# Patient Record
Sex: Female | Born: 1935 | Race: Black or African American | Hispanic: No | Smoking: Never smoker
Health system: Southern US, Community
[De-identification: ages and names within clinical notes are randomized; demographics above are authoritative.]

## PROBLEM LIST (undated history)

## (undated) DIAGNOSIS — IMO0001 Reserved for inherently not codable concepts without codable children: Secondary | ICD-10-CM

## (undated) DIAGNOSIS — I10 Essential (primary) hypertension: Secondary | ICD-10-CM

## (undated) DIAGNOSIS — K449 Diaphragmatic hernia without obstruction or gangrene: Secondary | ICD-10-CM

## (undated) DIAGNOSIS — F419 Anxiety disorder, unspecified: Secondary | ICD-10-CM

## (undated) DIAGNOSIS — M1990 Unspecified osteoarthritis, unspecified site: Secondary | ICD-10-CM

## (undated) DIAGNOSIS — K219 Gastro-esophageal reflux disease without esophagitis: Secondary | ICD-10-CM

## (undated) HISTORY — PX: ABDOMINAL HYSTERECTOMY: SHX81

## (undated) HISTORY — PX: HEMORROIDECTOMY: SUR656

## (undated) HISTORY — PX: ABDOMINAL SURGERY: SHX537

## (undated) HISTORY — PX: APPENDECTOMY: SHX54

## (undated) HISTORY — PX: TONSILLECTOMY: SUR1361

---

## 2006-06-28 ENCOUNTER — Ambulatory Visit: Payer: Self-pay | Admitting: Cardiology

## 2009-12-16 ENCOUNTER — Ambulatory Visit: Payer: Self-pay | Admitting: Cardiology

## 2010-01-03 ENCOUNTER — Ambulatory Visit: Payer: Self-pay | Admitting: Internal Medicine

## 2011-10-28 DIAGNOSIS — R112 Nausea with vomiting, unspecified: Secondary | ICD-10-CM

## 2011-12-21 ENCOUNTER — Other Ambulatory Visit: Payer: Self-pay | Admitting: Medical

## 2014-11-20 ENCOUNTER — Emergency Department (HOSPITAL_COMMUNITY): Payer: Medicare Other

## 2014-11-20 ENCOUNTER — Emergency Department (HOSPITAL_COMMUNITY)
Admission: EM | Admit: 2014-11-20 | Discharge: 2014-11-20 | Disposition: A | Discharge status: Home / Self Care | Payer: Medicare Other | Attending: Emergency Medicine | Admitting: Emergency Medicine

## 2014-11-20 ENCOUNTER — Encounter (HOSPITAL_COMMUNITY): Payer: Self-pay | Admitting: *Deleted

## 2014-11-20 DIAGNOSIS — K529 Noninfective gastroenteritis and colitis, unspecified: Secondary | ICD-10-CM | POA: Diagnosis not present

## 2014-11-20 DIAGNOSIS — Z79899 Other long term (current) drug therapy: Secondary | ICD-10-CM | POA: Insufficient documentation

## 2014-11-20 DIAGNOSIS — E860 Dehydration: Secondary | ICD-10-CM | POA: Diagnosis not present

## 2014-11-20 DIAGNOSIS — M1990 Unspecified osteoarthritis, unspecified site: Secondary | ICD-10-CM | POA: Insufficient documentation

## 2014-11-20 DIAGNOSIS — R1110 Vomiting, unspecified: Secondary | ICD-10-CM | POA: Diagnosis present

## 2014-11-20 DIAGNOSIS — I10 Essential (primary) hypertension: Secondary | ICD-10-CM | POA: Diagnosis not present

## 2014-11-20 DIAGNOSIS — Z7982 Long term (current) use of aspirin: Secondary | ICD-10-CM | POA: Diagnosis not present

## 2014-11-20 DIAGNOSIS — F419 Anxiety disorder, unspecified: Secondary | ICD-10-CM | POA: Diagnosis not present

## 2014-11-20 DIAGNOSIS — K219 Gastro-esophageal reflux disease without esophagitis: Secondary | ICD-10-CM | POA: Diagnosis not present

## 2014-11-20 DIAGNOSIS — Z880 Allergy status to penicillin: Secondary | ICD-10-CM | POA: Diagnosis not present

## 2014-11-20 HISTORY — DX: Reserved for inherently not codable concepts without codable children: IMO0001

## 2014-11-20 HISTORY — DX: Diaphragmatic hernia without obstruction or gangrene: K44.9

## 2014-11-20 HISTORY — DX: Unspecified osteoarthritis, unspecified site: M19.90

## 2014-11-20 HISTORY — DX: Essential (primary) hypertension: I10

## 2014-11-20 HISTORY — DX: Anxiety disorder, unspecified: F41.9

## 2014-11-20 HISTORY — DX: Gastro-esophageal reflux disease without esophagitis: K21.9

## 2014-11-20 LAB — COMPREHENSIVE METABOLIC PANEL
ALT: 22 U/L (ref 14–54)
AST: 31 U/L (ref 15–41)
Albumin: 3.9 g/dL (ref 3.5–5.0)
Alkaline Phosphatase: 64 U/L (ref 38–126)
Anion gap: 10 (ref 5–15)
BUN: 18 mg/dL (ref 6–20)
CO2: 26 mmol/L (ref 22–32)
Calcium: 9.5 mg/dL (ref 8.9–10.3)
Chloride: 103 mmol/L (ref 101–111)
Creatinine, Ser: 1.13 mg/dL — ABNORMAL HIGH (ref 0.44–1.00)
GFR calc Af Amer: 47 mL/min — ABNORMAL LOW (ref >60)
GFR, EST NON AFRICAN AMERICAN: 41 mL/min — AB (ref >60)
Glucose, Bld: 179 mg/dL — ABNORMAL HIGH (ref 65–99)
POTASSIUM: 4.3 mmol/L (ref 3.5–5.1)
Sodium: 139 mmol/L (ref 135–145)
Total Bilirubin: 1.2 mg/dL (ref 0.3–1.2)
Total Protein: 7.7 g/dL (ref 6.5–8.1)

## 2014-11-20 LAB — CBC WITH DIFFERENTIAL/PLATELET
BASOS ABS: 0 10*3/uL (ref 0.0–0.1)
BASOS PCT: 0 %
Eosinophils Absolute: 0 10*3/uL (ref 0.0–0.7)
Eosinophils Relative: 0 %
HEMATOCRIT: 41.9 % (ref 36.0–46.0)
HEMOGLOBIN: 14 g/dL (ref 12.0–15.0)
Lymphocytes Relative: 5 %
Lymphs Abs: 0.6 10*3/uL — ABNORMAL LOW (ref 0.7–4.0)
MCH: 30.2 pg (ref 26.0–34.0)
MCHC: 33.4 g/dL (ref 30.0–36.0)
MCV: 90.5 fL (ref 78.0–100.0)
Monocytes Absolute: 0.7 10*3/uL (ref 0.1–1.0)
Monocytes Relative: 7 %
NEUTROS ABS: 9.2 10*3/uL — AB (ref 1.7–7.7)
NEUTROS PCT: 88 %
Platelets: 182 10*3/uL (ref 150–400)
RBC: 4.63 MIL/uL (ref 3.87–5.11)
RDW: 13.8 % (ref 11.5–15.5)
WBC: 10.5 10*3/uL (ref 4.0–10.5)

## 2014-11-20 LAB — LIPASE, BLOOD: LIPASE: 31 U/L (ref 22–51)

## 2014-11-20 MED ORDER — ONDANSETRON HCL 4 MG PO TABS: 4.0000 mg | ORAL_TABLET | Freq: Four times a day (QID) | ORAL | Status: DC

## 2014-11-20 MED ORDER — MORPHINE SULFATE (PF) 2 MG/ML IV SOLN
2.0000 mg | Freq: Once | INTRAVENOUS | Status: AC
  Administered 2014-11-20: 2 mg via INTRAVENOUS
  Filled 2014-11-20: qty 1

## 2014-11-20 MED ORDER — ONDANSETRON HCL 4 MG/2ML IJ SOLN
4.0000 mg | Freq: Once | INTRAMUSCULAR | Status: AC
  Administered 2014-11-20: 4 mg via INTRAVENOUS
  Filled 2014-11-20: qty 2

## 2014-11-20 MED ORDER — SODIUM CHLORIDE 0.9 % IV BOLUS (SEPSIS)
500.0000 mL | Freq: Once | INTRAVENOUS | Status: AC
  Administered 2014-11-20: 500 mL via INTRAVENOUS

## 2014-11-20 NOTE — ED Notes (Signed)
Pt states vomiting began at ~2200 last night. Pt states that she vomiting ~8x during the night. Last vomited at 0600. Pt also states discomfort to epigastric area.

## 2014-11-20 NOTE — ED Provider Notes (Signed)
CSN: [id number]     Arrival date & time 11/20/14  [id number] History  By signing my name below, I, Marica OtterNusrat Rahman, attest that this documentation has been prepared under the direction and in the presence of Donnetta HutchingBrian Trinidad Petron, MD. Electronically Signed: Marica OtterNusrat Rahman, ED Scribe. 11/20/2014. 9:59 AM.  Chief Complaint  Patient presents with  . Emesis   The history is provided by the patient. No language interpreter was used.   PCP: Ernestine ConradBLUTH, KIRK, MD HPI Comments: Dawn Estrada is a 81 y.o. female who presents to the Emergency Department complaining of vomiting of brown bilious matter and undigested food onset at 11PM last night. Pt reports eight episodes of vomiting last night and one episode of vomiting at 6AM this morning. Associated Sx include: 5/10 epigastrica bd pain. Pt denies diarrhea. Pt denies taking any measures at home to alleviate her Sx.  Past Medical History  Diagnosis Date  . Hypertension   . Reflux   . Hiatal hernia   . Anxiety   . Arthritis    Past Surgical History  Procedure Laterality Date  . Abdominal hysterectomy    . Appendectomy    . Hemorroidectomy    . Abdominal surgery      resection  . Tonsillectomy     No family history on file. Social History  Substance Use Topics  . Smoking status: Never Smoker   . Smokeless tobacco: None  . Alcohol Use: No   OB History    No data available     Review of Systems  Constitutional: Negative for fever.    A complete 10 system review of systems was obtained and all systems are negative except as noted in the HPI and PMH.   Allergies  Penicillins  Home Medications   Prior to Admission medications   Medication Sig Start Date End Date Taking? Authorizing Provider  ALPRAZolam (XANAX) 0.25 MG tablet Take 0.125 mg by mouth daily.   Yes Historical Provider, MD  aspirin EC 81 MG tablet Take 81 mg by mouth daily as needed (heart).   Yes Historical Provider, MD  brimonidine (ALPHAGAN) 0.15 % ophthalmic solution Place 1 drop  into both eyes 2 (two) times daily. 10/19/14  Yes Historical Provider, MD  cholecalciferol (VITAMIN D) 1000 UNITS tablet Take 1,000 Units by mouth daily.   Yes Historical Provider, MD  hydrochlorothiazide (MICROZIDE) 12.5 MG capsule Take 12.5 mg by mouth daily.   Yes Historical Provider, MD  metoprolol succinate (TOPROL-XL) 25 MG 24 hr tablet Take 12.5 mg by mouth daily.   Yes Historical Provider, MD  Multiple Vitamin (MULTIVITAMIN WITH MINERALS) TABS tablet Take 1 tablet by mouth daily.   Yes Historical Provider, MD  Omega-3 Fatty Acids (FISH OIL PO) Take 1 capsule by mouth daily.   Yes Historical Provider, MD  PAZEO 0.7 % SOLN Place 1 drop into both eyes daily. 10/06/14  Yes Historical Provider, MD  potassium chloride SA (K-DUR,KLOR-CON) 20 MEQ tablet Take 1 tablet by mouth daily. 11/19/14  Yes Historical Provider, MD  ranitidine (ZANTAC) 150 MG tablet Take 150 mg by mouth 2 (two) times daily.   Yes Historical Provider, MD  ondansetron (ZOFRAN) 4 MG tablet Take 1 tablet (4 mg total) by mouth every 6 (six) hours. 11/20/14   Donnetta HutchingBrian Nollan Muldrow, MD   Triage Vitals: BP 156/71 mmHg  Pulse 87  Temp(Src) 98.4 F (36.9 C) (Oral)  Resp 20  Ht 5\' 6"  (1.676 m)  Wt 135 lb (61.236 kg)  BMI 21.80 kg/m2  SpO2  98% Physical Exam  Constitutional: She is oriented to person, place, and time. She appears well-developed and well-nourished.  Slightly dehydrated appearing but non-toxic appearing.   HENT:  Head: Normocephalic and atraumatic.  Mouth/Throat: Mucous membranes are dry.  Eyes: Conjunctivae and EOM are normal. Pupils are equal, round, and reactive to light.  Neck: Normal range of motion. Neck supple.  Cardiovascular: Normal rate and regular rhythm.   Pulmonary/Chest: Effort normal and breath sounds normal.  Abdominal: Soft. Bowel sounds are normal. There is tenderness in the epigastric area.  Musculoskeletal: Normal range of motion.  Neurological: She is alert and oriented to person, place, and time.   Skin: Skin is warm and dry.  Psychiatric: She has a normal mood and affect. Her behavior is normal.  Nursing note and vitals reviewed.   ED Course  Procedures (including critical care time) DIAGNOSTIC STUDIES: Oxygen Saturation is 98% on RA, nl by my interpretation.    COORDINATION OF CARE: 9:41 AM: Discussed treatment plan with pt and her son at bedside; patient verbalizes understanding and agrees with treatment plan.  MDM   Final diagnoses:  Gastroenteritis    Patient feels much better after IV fluids and IV Zofran. No acute abdomen. Glucose minimally elevated. Acute abdominal series shows a hiatal hernia but no obstruction. Discharge medications Zofran 4 mg  I, Mehek Grega, personally performed the services described in this documentation. All medical record entries made by the scribe were at my direction and in my presence.  I have reviewed the chart and discharge instructions and agree that the record reflects my personal performance and is accurate and complete. Jayona Mccaig.  11/20/2014. 1:55 PM.     Donnetta Hutching, MD 11/20/14 1356

## 2014-11-20 NOTE — Discharge Instructions (Signed)
Clear liquids today. Prescription for nausea medicine. Return if worse.

## 2014-11-25 ENCOUNTER — Emergency Department (HOSPITAL_COMMUNITY): Payer: Medicare Other

## 2014-11-25 ENCOUNTER — Encounter (HOSPITAL_COMMUNITY): Payer: Self-pay | Admitting: Cardiology

## 2014-11-25 ENCOUNTER — Observation Stay (HOSPITAL_COMMUNITY)
Admission: EM | Admit: 2014-11-25 | Discharge: 2014-11-26 | Disposition: A | Discharge status: Home / Self Care | Payer: Medicare Other | Attending: Internal Medicine | Admitting: Internal Medicine

## 2014-11-25 DIAGNOSIS — M1990 Unspecified osteoarthritis, unspecified site: Secondary | ICD-10-CM | POA: Diagnosis not present

## 2014-11-25 DIAGNOSIS — K449 Diaphragmatic hernia without obstruction or gangrene: Secondary | ICD-10-CM | POA: Insufficient documentation

## 2014-11-25 DIAGNOSIS — R079 Chest pain, unspecified: Secondary | ICD-10-CM

## 2014-11-25 DIAGNOSIS — Z7982 Long term (current) use of aspirin: Secondary | ICD-10-CM | POA: Diagnosis not present

## 2014-11-25 DIAGNOSIS — R531 Weakness: Secondary | ICD-10-CM

## 2014-11-25 DIAGNOSIS — E876 Hypokalemia: Secondary | ICD-10-CM

## 2014-11-25 DIAGNOSIS — M25461 Effusion, right knee: Secondary | ICD-10-CM

## 2014-11-25 DIAGNOSIS — K219 Gastro-esophageal reflux disease without esophagitis: Secondary | ICD-10-CM | POA: Insufficient documentation

## 2014-11-25 DIAGNOSIS — R1013 Epigastric pain: Secondary | ICD-10-CM | POA: Diagnosis not present

## 2014-11-25 DIAGNOSIS — F419 Anxiety disorder, unspecified: Secondary | ICD-10-CM | POA: Diagnosis not present

## 2014-11-25 DIAGNOSIS — Z79899 Other long term (current) drug therapy: Secondary | ICD-10-CM | POA: Diagnosis not present

## 2014-11-25 DIAGNOSIS — R000 Tachycardia, unspecified: Secondary | ICD-10-CM

## 2014-11-25 DIAGNOSIS — J159 Unspecified bacterial pneumonia: Secondary | ICD-10-CM | POA: Diagnosis not present

## 2014-11-25 DIAGNOSIS — I10 Essential (primary) hypertension: Secondary | ICD-10-CM | POA: Diagnosis not present

## 2014-11-25 DIAGNOSIS — R7989 Other specified abnormal findings of blood chemistry: Secondary | ICD-10-CM | POA: Diagnosis not present

## 2014-11-25 DIAGNOSIS — K529 Noninfective gastroenteritis and colitis, unspecified: Secondary | ICD-10-CM

## 2014-11-25 DIAGNOSIS — J189 Pneumonia, unspecified organism: Secondary | ICD-10-CM | POA: Diagnosis present

## 2014-11-25 DIAGNOSIS — D72829 Elevated white blood cell count, unspecified: Secondary | ICD-10-CM

## 2014-11-25 DIAGNOSIS — R778 Other specified abnormalities of plasma proteins: Secondary | ICD-10-CM

## 2014-11-25 DIAGNOSIS — Z880 Allergy status to penicillin: Secondary | ICD-10-CM | POA: Insufficient documentation

## 2014-11-25 LAB — MAGNESIUM: Magnesium: 1.6 mg/dL — ABNORMAL LOW (ref 1.7–2.4)

## 2014-11-25 LAB — I-STAT CG4 LACTIC ACID, ED
LACTIC ACID, VENOUS: 1.61 mmol/L (ref 0.5–2.0)
Lactic Acid, Venous: 1.09 mmol/L (ref 0.5–2.0)

## 2014-11-25 LAB — URINALYSIS, ROUTINE W REFLEX MICROSCOPIC
BILIRUBIN URINE: NEGATIVE
Glucose, UA: NEGATIVE mg/dL
NITRITE: NEGATIVE
PH: 6.5 (ref 5.0–8.0)
Protein, ur: NEGATIVE mg/dL
UROBILINOGEN UA: 0.2 mg/dL (ref 0.0–1.0)

## 2014-11-25 LAB — TROPONIN I
TROPONIN I: 0.04 ng/mL — AB (ref <0.031)
TROPONIN I: 0.04 ng/mL — AB (ref <0.031)
TROPONIN I: 0.05 ng/mL — AB (ref <0.031)
TROPONIN I: 0.06 ng/mL — AB (ref <0.031)

## 2014-11-25 LAB — CBC
HEMATOCRIT: 36.2 % (ref 36.0–46.0)
HEMOGLOBIN: 12.1 g/dL (ref 12.0–15.0)
MCH: 29.7 pg (ref 26.0–34.0)
MCHC: 33.4 g/dL (ref 30.0–36.0)
MCV: 88.9 fL (ref 78.0–100.0)
PLATELETS: 181 10*3/uL (ref 150–400)
RBC: 4.07 MIL/uL (ref 3.87–5.11)
RDW: 13.4 % (ref 11.5–15.5)
WBC: 11 10*3/uL — AB (ref 4.0–10.5)

## 2014-11-25 LAB — HEPATIC FUNCTION PANEL
ALK PHOS: 51 U/L (ref 38–126)
ALT: 16 U/L (ref 14–54)
AST: 24 U/L (ref 15–41)
Albumin: 3.4 g/dL — ABNORMAL LOW (ref 3.5–5.0)
BILIRUBIN DIRECT: 0.2 mg/dL (ref 0.1–0.5)
BILIRUBIN INDIRECT: 1.4 mg/dL — AB (ref 0.3–0.9)
TOTAL PROTEIN: 7.4 g/dL (ref 6.5–8.1)
Total Bilirubin: 1.6 mg/dL — ABNORMAL HIGH (ref 0.3–1.2)

## 2014-11-25 LAB — URINE MICROSCOPIC-ADD ON

## 2014-11-25 LAB — BASIC METABOLIC PANEL
ANION GAP: 12 (ref 5–15)
BUN: 11 mg/dL (ref 6–20)
CO2: 26 mmol/L (ref 22–32)
Calcium: 9 mg/dL (ref 8.9–10.3)
Chloride: 101 mmol/L (ref 101–111)
Creatinine, Ser: 0.91 mg/dL (ref 0.44–1.00)
GFR calc Af Amer: >60 mL/min (ref >60)
GFR, EST NON AFRICAN AMERICAN: 53 mL/min — AB (ref >60)
Glucose, Bld: 143 mg/dL — ABNORMAL HIGH (ref 65–99)
POTASSIUM: 2.9 mmol/L — AB (ref 3.5–5.1)
SODIUM: 139 mmol/L (ref 135–145)

## 2014-11-25 LAB — TSH: TSH: 1.521 u[IU]/mL (ref 0.350–4.500)

## 2014-11-25 LAB — VITAMIN B12: Vitamin B-12: 234 pg/mL (ref 180–914)

## 2014-11-25 LAB — LIPASE, BLOOD: LIPASE: 29 U/L (ref 22–51)

## 2014-11-25 MED ORDER — SODIUM CHLORIDE 0.9 % IV SOLN: INTRAVENOUS | Status: DC

## 2014-11-25 MED ORDER — FENTANYL CITRATE (PF) 100 MCG/2ML IJ SOLN
12.5000 ug | Freq: Once | INTRAMUSCULAR | Status: AC
  Administered 2014-11-25: 12.5 ug via INTRAVENOUS
  Filled 2014-11-25: qty 2

## 2014-11-25 MED ORDER — ACETAMINOPHEN 650 MG RE SUPP: 650.0000 mg | Freq: Four times a day (QID) | RECTAL | Status: DC | PRN

## 2014-11-25 MED ORDER — IOHEXOL 300 MG/ML  SOLN
50.0000 mL | Freq: Once | INTRAMUSCULAR | Status: AC | PRN
  Administered 2014-11-25: 50 mL via ORAL

## 2014-11-25 MED ORDER — ASPIRIN EC 81 MG PO TBEC
81.0000 mg | DELAYED_RELEASE_TABLET | Freq: Every day | ORAL | Status: DC
  Administered 2014-11-25 – 2014-11-26 (×2): 81 mg via ORAL
  Filled 2014-11-25 (×2): qty 1

## 2014-11-25 MED ORDER — FAMOTIDINE 20 MG PO TABS
20.0000 mg | ORAL_TABLET | Freq: Every day | ORAL | Status: DC
  Administered 2014-11-26: 20 mg via ORAL
  Filled 2014-11-25: qty 1

## 2014-11-25 MED ORDER — OLOPATADINE HCL 0.7 % OP SOLN: 1.0000 [drp] | Freq: Every day | OPHTHALMIC | Status: DC

## 2014-11-25 MED ORDER — ALPRAZOLAM 0.25 MG PO TABS
0.1250 mg | ORAL_TABLET | Freq: Every day | ORAL | Status: DC
  Administered 2014-11-25 – 2014-11-26 (×2): 0.125 mg via ORAL
  Filled 2014-11-25 (×2): qty 1

## 2014-11-25 MED ORDER — BRIMONIDINE TARTRATE 0.15 % OP SOLN
1.0000 [drp] | Freq: Two times a day (BID) | OPHTHALMIC | Status: DC
  Administered 2014-11-25 – 2014-11-26 (×2): 1 [drp] via OPHTHALMIC
  Filled 2014-11-25: qty 5

## 2014-11-25 MED ORDER — POTASSIUM CHLORIDE CRYS ER 20 MEQ PO TBCR
40.0000 meq | EXTENDED_RELEASE_TABLET | ORAL | Status: AC
  Administered 2014-11-25 – 2014-11-26 (×2): 40 meq via ORAL
  Filled 2014-11-25 (×3): qty 2

## 2014-11-25 MED ORDER — LEVOFLOXACIN IN D5W 750 MG/150ML IV SOLN
750.0000 mg | Freq: Once | INTRAVENOUS | Status: AC
Start: 2014-11-25 — End: 2014-11-25
  Administered 2014-11-25: 750 mg via INTRAVENOUS
  Filled 2014-11-25: qty 150

## 2014-11-25 MED ORDER — IOHEXOL 300 MG/ML  SOLN
100.0000 mL | Freq: Once | INTRAMUSCULAR | Status: AC | PRN
  Administered 2014-11-25: 100 mL via INTRAVENOUS

## 2014-11-25 MED ORDER — LEVOFLOXACIN 750 MG PO TABS: 750.0000 mg | ORAL_TABLET | Freq: Every day | ORAL | Status: DC

## 2014-11-25 MED ORDER — SENNOSIDES-DOCUSATE SODIUM 8.6-50 MG PO TABS: 1.0000 | ORAL_TABLET | Freq: Every evening | ORAL | Status: DC | PRN

## 2014-11-25 MED ORDER — SODIUM CHLORIDE 0.9 % IV BOLUS (SEPSIS)
250.0000 mL | Freq: Once | INTRAVENOUS | Status: AC
  Administered 2014-11-25: 250 mL via INTRAVENOUS

## 2014-11-25 MED ORDER — VITAMIN D 1000 UNITS PO TABS
1000.0000 [IU] | ORAL_TABLET | Freq: Every day | ORAL | Status: DC
  Administered 2014-11-25 – 2014-11-26 (×2): 1000 [IU] via ORAL
  Filled 2014-11-25 (×2): qty 1

## 2014-11-25 MED ORDER — METOPROLOL SUCCINATE ER 25 MG PO TB24
12.5000 mg | ORAL_TABLET | Freq: Every day | ORAL | Status: DC
  Administered 2014-11-25 – 2014-11-26 (×2): 12.5 mg via ORAL
  Filled 2014-11-25 (×2): qty 1

## 2014-11-25 MED ORDER — ENSURE ENLIVE PO LIQD
237.0000 mL | Freq: Two times a day (BID) | ORAL | Status: DC
  Administered 2014-11-26: 237 mL via ORAL

## 2014-11-25 MED ORDER — ADULT MULTIVITAMIN W/MINERALS CH
1.0000 | ORAL_TABLET | Freq: Every day | ORAL | Status: DC
  Administered 2014-11-26: 1 via ORAL
  Filled 2014-11-25: qty 1

## 2014-11-25 MED ORDER — POTASSIUM CHLORIDE CRYS ER 20 MEQ PO TBCR
40.0000 meq | EXTENDED_RELEASE_TABLET | Freq: Once | ORAL | Status: AC
  Administered 2014-11-25: 40 meq via ORAL
  Filled 2014-11-25: qty 2

## 2014-11-25 MED ORDER — ACETAMINOPHEN 325 MG PO TABS: 650.0000 mg | ORAL_TABLET | Freq: Four times a day (QID) | ORAL | Status: DC | PRN

## 2014-11-25 MED ORDER — ONDANSETRON HCL 4 MG/2ML IJ SOLN: 4.0000 mg | Freq: Four times a day (QID) | INTRAMUSCULAR | Status: DC | PRN

## 2014-11-25 MED ORDER — ENOXAPARIN SODIUM 40 MG/0.4ML ~~LOC~~ SOLN
40.0000 mg | SUBCUTANEOUS | Status: DC
  Administered 2014-11-25: 40 mg via SUBCUTANEOUS
  Filled 2014-11-25: qty 0.4

## 2014-11-25 MED ORDER — OMEGA-3-ACID ETHYL ESTERS 1 G PO CAPS
1.0000 g | ORAL_CAPSULE | Freq: Every day | ORAL | Status: DC
  Administered 2014-11-26: 1 g via ORAL
  Filled 2014-11-25: qty 1

## 2014-11-25 MED ORDER — SODIUM CHLORIDE 0.9 % IV SOLN
INTRAVENOUS | Status: DC
  Administered 2014-11-25: 13:00:00 via INTRAVENOUS

## 2014-11-25 MED ORDER — SODIUM CHLORIDE 0.9 % IJ SOLN: 3.0000 mL | Freq: Two times a day (BID) | INTRAMUSCULAR | Status: DC

## 2014-11-25 MED ORDER — ONDANSETRON HCL 4 MG PO TABS: 4.0000 mg | ORAL_TABLET | Freq: Four times a day (QID) | ORAL | Status: DC | PRN

## 2014-11-25 NOTE — ED Notes (Signed)
Left sided breast pain since yesterday.  Seen here last week with vomiting.

## 2014-11-25 NOTE — ED Provider Notes (Signed)
CSN: [id number]     Arrival date & time 11/25/14  1022 History  By signing my name below, I, Ronney LionSuzanne Le, attest that this documentation has been prepared under the direction and in the presence of Vanetta MuldersScott Lucciano Vitali, MD. Electronically Signed: Ronney LionSuzanne Le, ED Scribe. 11/25/2014. 11:15 AM.    Chief Complaint  Patient presents with  . Chest Pain   Patient is a 81 y.o. female presenting with chest pain. The history is provided by the patient. No language interpreter was used.  Chest Pain Pain location:  Epigastric and L chest Pain severity:  Severe Onset quality:  Gradual Duration:  5 days Timing:  Intermittent Progression:  Worsening Chronicity:  New Relieved by:  None tried Worsened by:  Nothing tried Ineffective treatments:  None tried Associated symptoms: abdominal pain, nausea and vomiting   Associated symptoms: no back pain, no cough, no fever, no headache and no shortness of breath   Risk factors: hypertension     HPI Comments: Dawn Estrada is a 81 y.o. female with a history of HTN, GERD, hiatal hernia, anxiety, and arthritis, who presents to the Emergency Department complaining of intermittent, 9/10, worsening epigastric abdominal/chest (unclear which) pain radiating to her left-sided chest, that began about 5 days ago, at which time she was seen at the ED for vomiting. Although she states her vomiting subsided, she reports gradually worsening pain over the past several days, and her husband states she had difficulty sleeping last night secondary to the pain. She endorses associated chills, nausea, some diarrhea, and 2-3 "itchy spots" on her legs when asked if she noticed a rash. Her husband also mentions pain at the back of her right knee. She denies any known fevers, dysuria, cough, rhinorrhea, sore throat, SOB, dysuria, leg swelling, bleeding easily, blood in her stool, back pain, or headache.  PCP: Ernestine ConradBLUTH, KIRK, MD    Past Medical History  Diagnosis Date  . Hypertension   .  Reflux   . Hiatal hernia   . Anxiety   . Arthritis    Past Surgical History  Procedure Laterality Date  . Abdominal hysterectomy    . Appendectomy    . Hemorroidectomy    . Abdominal surgery      resection  . Tonsillectomy     History reviewed. No pertinent family history. Social History  Substance Use Topics  . Smoking status: Never Smoker   . Smokeless tobacco: None  . Alcohol Use: No   OB History    No data available     Review of Systems  Constitutional: Positive for chills. Negative for fever.  HENT: Negative for rhinorrhea and sore throat.   Eyes: Negative for visual disturbance.  Respiratory: Negative for cough and shortness of breath.   Cardiovascular: Positive for chest pain. Negative for leg swelling.  Gastrointestinal: Positive for nausea, vomiting, abdominal pain and diarrhea. Negative for blood in stool.  Genitourinary: Negative for dysuria.  Musculoskeletal: Negative for back pain.  Skin: Positive for rash (2-3 "itchy spots" on leg, per pt).  Neurological: Negative for headaches.  Hematological: Does not bruise/bleed easily.      Allergies  Penicillins  Home Medications   Prior to Admission medications   Medication Sig Start Date End Date Taking? Authorizing Provider  ALPRAZolam (XANAX) 0.25 MG tablet Take 0.125 mg by mouth daily.   Yes Historical Provider, MD  aspirin EC 81 MG tablet Take 81 mg by mouth daily as needed (heart).   Yes Historical Provider, MD  brimonidine (ALPHAGAN) 0.15 %  ophthalmic solution Place 1 drop into both eyes 2 (two) times daily. 10/19/14  Yes Historical Provider, MD  cholecalciferol (VITAMIN D) 1000 UNITS tablet Take 1,000 Units by mouth daily.   Yes Historical Provider, MD  hydrochlorothiazide (MICROZIDE) 12.5 MG capsule Take 12.5 mg by mouth daily.   Yes Historical Provider, MD  metoprolol succinate (TOPROL-XL) 25 MG 24 hr tablet Take 12.5 mg by mouth daily.   Yes Historical Provider, MD  Multiple Vitamin (MULTIVITAMIN  WITH MINERALS) TABS tablet Take 1 tablet by mouth daily.   Yes Historical Provider, MD  Omega-3 Fatty Acids (FISH OIL PO) Take 1 capsule by mouth daily.   Yes Historical Provider, MD  ondansetron (ZOFRAN) 4 MG tablet Take 1 tablet (4 mg total) by mouth every 6 (six) hours. 11/20/14  Yes Donnetta Hutching, MD  potassium chloride SA (K-DUR,KLOR-CON) 20 MEQ tablet Take 1 tablet by mouth daily. 11/19/14  Yes Historical Provider, MD  ranitidine (ZANTAC) 150 MG tablet Take 150 mg by mouth 2 (two) times daily.   Yes Historical Provider, MD  PAZEO 0.7 % SOLN Place 1 drop into both eyes daily. 10/06/14   Historical Provider, MD   BP 139/75 mmHg  Pulse 103  Temp(Src) 97.4 F (36.3 C) (Oral)  Resp 15  Ht 5\' 6"  (1.676 m)  Wt 135 lb (61.236 kg)  BMI 21.80 kg/m2  SpO2 93% Physical Exam  Constitutional: She is oriented to person, place, and time. She appears well-developed and well-nourished. No distress.  HENT:  Head: Normocephalic and atraumatic.  Mouth/Throat: Oropharynx is clear and moist.  Moist mucous membranes.   Left knee: Patella is in place. No effusion. Behind the left knee is normal.   Right knee: Patella is in place. No effusion. Knot measuring about 2 x 3 cm.   Eyes: Conjunctivae and EOM are normal. Pupils are equal, round, and reactive to light.  Neck: Neck supple. No tracheal deviation present.  Cardiovascular: Normal rate, regular rhythm and normal heart sounds.   Pulmonary/Chest: Effort normal and breath sounds normal. No respiratory distress. She has no wheezes. She has no rales.  Abdominal: Soft. She exhibits distension. Bowel sounds are decreased. There is no tenderness.  Epigastric tenderness. Abdomen is slightly distended. Decreased bowel sounds are present.  Musculoskeletal: Normal range of motion. She exhibits no edema.  No BLE swelling.  Neurological: She is alert and oriented to person, place, and time.  Skin: Skin is warm and dry.  Psychiatric: She has a normal mood and  affect. Her behavior is normal.  Nursing note and vitals reviewed.   ED Course  Procedures (including critical care time)  DIAGNOSTIC STUDIES: Oxygen Saturation is 93% on RA, adequate by my interpretation.    COORDINATION OF CARE: 11:02 AM - Discussed treatment plan with pt at bedside which includes U/S of knot on posterior right knee, CXR, UA, and other diagnostic testing. Pt verbalized understanding and agreed to plan.   Labs Review Labs Reviewed  BASIC METABOLIC PANEL - Abnormal; Notable for the following:    Potassium 2.9 (*)    Glucose, Bld 143 (*)    GFR calc non Af Amer 53 (*)    All other components within normal limits  CBC - Abnormal; Notable for the following:    WBC 11.0 (*)    All other components within normal limits  TROPONIN I - Abnormal; Notable for the following:    Troponin I 0.04 (*)    All other components within normal limits  URINALYSIS, ROUTINE W  REFLEX MICROSCOPIC (NOT AT [facility]) - Abnormal; Notable for the following:    Specific Gravity, Urine <1.005 (*)    Hgb urine dipstick TRACE (*)    Ketones, ur TRACE (*)    Leukocytes, UA SMALL (*)    All other components within normal limits  HEPATIC FUNCTION PANEL - Abnormal; Notable for the following:    Albumin 3.4 (*)    Total Bilirubin 1.6 (*)    Indirect Bilirubin 1.4 (*)    All other components within normal limits  URINE MICROSCOPIC-ADD ON - Abnormal; Notable for the following:    Squamous Epithelial / LPF FEW (*)    All other components within normal limits  CULTURE, BLOOD (ROUTINE X 2)  CULTURE, BLOOD (ROUTINE X 2)  LIPASE, BLOOD  TROPONIN I  I-STAT CG4 LACTIC ACID, ED  I-STAT CG4 LACTIC ACID, ED   Results for orders placed or performed during the hospital encounter of 11/25/14  Basic metabolic panel  Result Value Ref Range   Sodium 139 135 - 145 mmol/L   Potassium 2.9 (L) 3.5 - 5.1 mmol/L   Chloride 101 101 - 111 mmol/L   CO2 26 22 - 32 mmol/L   Glucose, Bld 143 (H) 65 - 99 mg/dL   BUN  11 6 - 20 mg/dL   Creatinine, Ser 1.61 0.44 - 1.00 mg/dL   Calcium 9.0 8.9 - 09.6 mg/dL   GFR calc non Af Amer 53 (L) >60 mL/min   GFR calc Af Amer >60 >60 mL/min   Anion gap 12 5 - 15  CBC  Result Value Ref Range   WBC 11.0 (H) 4.0 - 10.5 K/uL   RBC 4.07 3.87 - 5.11 MIL/uL   Hemoglobin 12.1 12.0 - 15.0 g/dL   HCT 04.5 40.9 - 81.1 %   MCV 88.9 78.0 - 100.0 fL   MCH 29.7 26.0 - 34.0 pg   MCHC 33.4 30.0 - 36.0 g/dL   RDW 91.4 78.2 - 95.6 %   Platelets 181 150 - 400 K/uL  Troponin I  Result Value Ref Range   Troponin I 0.04 (H) <0.031 ng/mL  Urinalysis, Routine w reflex microscopic (not at [facility])  Result Value Ref Range   Color, Urine YELLOW YELLOW   APPearance CLEAR CLEAR   Specific Gravity, Urine <1.005 (L) 1.005 - 1.030   pH 6.5 5.0 - 8.0   Glucose, UA NEGATIVE NEGATIVE mg/dL   Hgb urine dipstick TRACE (A) NEGATIVE   Bilirubin Urine NEGATIVE NEGATIVE   Ketones, ur TRACE (A) NEGATIVE mg/dL   Protein, ur NEGATIVE NEGATIVE mg/dL   Urobilinogen, UA 0.2 0.0 - 1.0 mg/dL   Nitrite NEGATIVE NEGATIVE   Leukocytes, UA SMALL (A) NEGATIVE  Hepatic function panel  Result Value Ref Range   Total Protein 7.4 6.5 - 8.1 g/dL   Albumin 3.4 (L) 3.5 - 5.0 g/dL   AST 24 15 - 41 U/L   ALT 16 14 - 54 U/L   Alkaline Phosphatase 51 38 - 126 U/L   Total Bilirubin 1.6 (H) 0.3 - 1.2 mg/dL   Bilirubin, Direct 0.2 0.1 - 0.5 mg/dL   Indirect Bilirubin 1.4 (H) 0.3 - 0.9 mg/dL  Lipase, blood  Result Value Ref Range   Lipase 29 22 - 51 U/L  Urine microscopic-add on  Result Value Ref Range   Squamous Epithelial / LPF FEW (A) RARE   WBC, UA 0-2 <3 WBC/hpf   RBC / HPF 0-2 <3 RBC/hpf  I-Stat CG4 Lactic Acid, ED  Result  Value Ref Range   Lactic Acid, Venous 1.61 0.5 - 2.0 mmol/L     Imaging Review Dg Chest 2 View  11/25/2014  CLINICAL DATA:  Left-sided chest pain and fever since yesterday EXAM: CHEST  2 VIEW COMPARISON:  11/20/2014 FINDINGS: There is a small left pleural effusion. There is left  basilar airspace disease. There is hazy right basilar airspace disease. There is no pneumothorax. The heart and mediastinal contours are unremarkable. There is a small hiatal hernia. The osseous structures are unremarkable. IMPRESSION: Left lower lobe airspace disease with a small pleural effusion most concerning for pneumonia. Hazy right basilar airspace disease likely reflecting mild atelectasis. Electronically Signed   By: Elige Ko   On: 11/25/2014 11:23   Ct Abdomen Pelvis W Contrast  11/25/2014  CLINICAL DATA:  Left-sided breast pain since yesterday. EXAM: CT ABDOMEN AND PELVIS WITH CONTRAST TECHNIQUE: Multidetector CT imaging of the abdomen and pelvis was performed using the standard protocol following bolus administration of intravenous contrast. CONTRAST:  50mL OMNIPAQUE IOHEXOL 300 MG/ML SOLN, OMNIPAQUE IOHEXOL 300 MG/ML SOLN COMPARISON:  None FINDINGS: Lower chest: Lingular airspace disease most consistent with pneumonia. Small left pleural effusion. Left basilar atelectasis. Hepatobiliary: Multiple hypodense, fluid attenuating hepatic masses scattered throughout the liver most consistent with cysts with the largest measuring 2 cm. No intrahepatic or extrahepatic biliary ductal dilatation. Prior cholecystectomy. Pancreas: Normal. Spleen: Normal. Adrenals/Urinary Tract: Normal adrenal glands. 3.2 x 3.2 cm hypodense, fluid attenuating left interpolar renal mass most consistent with a cyst. Multiple hypodense, fluid attenuating right renal mass is with the largest measuring 11 mm in the posterior interpolar aspect most consistent with cysts. No urolithiasis or obstructive uropathy. Partially decompressed bladder. Stomach/Bowel: Large hiatal hernia. No bowel dilatation to suggest obstruction. Multiple small bowel air-fluid levels which may reflect mild enteritis. No pneumatosis, pneumoperitoneum or portal venous gas. No abdominal or pelvic free fluid. Vascular/Lymphatic: Normal caliber abdominal  aorta with atherosclerosis. No abdominal or pelvic lymphadenopathy. Reproductive: Prior hysterectomy.  No adnexal mass. Other: No fluid collection or hematoma. Musculoskeletal: No lytic or sclerotic osseous lesion. No acute osseous abnormality. IMPRESSION: 1. Lingular airspace disease most consistent with pneumonia. 2. Multiple small bowel air-fluid levels as can be seen with mild enteritis. 3. Large hiatal hernia. 4. Bilateral renal cysts. 5. Multiple hepatic cysts. Electronically Signed   By: Elige Ko   On: 11/25/2014 11:53   [place] Extrem Low Right Ltd  11/25/2014  CLINICAL DATA:  81 year old female with a history of right posterior knee swelling EXAM: ULTRASOUND RIGHT LOWER EXTREMITY LIMITED TECHNIQUE: Ultrasound examination of the lower extremity soft tissues was performed in the area of clinical concern. COMPARISON:  None FINDINGS: Directed grayscale and color duplex in the region of clinical concern, right popliteal fossa. Focus of heterogeneous fluid posterior to the right knee, 2 cm medial to the course of the artery and vein. This collection is mixed echogenicity measuring 3.9 cm x 2.5 cm x 3.3 cm. No internal color flow. No definite association with the knee joint is demonstrated. IMPRESSION: Targeted ultrasound of the right popliteal fossa demonstrates heterogeneous fluid collection medial to the course of the artery and vein. Most likely differential diagnosis is a complex Baker's cyst, although a seroma, hematoma, or possible abscess could have this appearance. Signed, Yvone Neu. Loreta Ave, DO [facility] Electronically Signed   By: Gilmer Mor D.O.   On: 11/25/2014 13:12   Dg Knee Complete 4 Views Right  11/25/2014  CLINICAL DATA:  Pain and swelling behind the knee. EXAM: RIGHT KNEE - COMPLETE 4+ VIEW COMPARISON:  None. FINDINGS: There is no evidence of fracture, dislocation, or joint effusion. There is no evidence of arthropathy or other  focal bone abnormality. Soft tissues are unremarkable. IMPRESSION: No acute osseous injury of the right knee. Electronically Signed   By: Elige Ko   On: 11/25/2014 12:50   I have personally reviewed and evaluated these images and lab results as part of my medical decision-making.   EKG Interpretation   Date/Time:  Wednesday November 25 2014 10:33:23 EDT Ventricular Rate:  98 PR Interval:  171 QRS Duration: 89 QT Interval:  356 QTC Calculation: 454 R Axis:   -25 Text Interpretation:  Sinus rhythm Inferior infarct, old Baseline wander  in lead(s) V2 No significant change since last tracing Confirmed by  Princesa Willig  MD, Texie Tupou [phone]) on 11/25/2014 10:50:29 AM      MDM   Final diagnoses:  CAP (community acquired pneumonia)  Hypokalemia    Patient's chest x-ray CT scan consistent with left-sided pneumonia. Patient also with a little bit of tachycardia. Not hypoxic. Patient will be treated with Levaquin since she has an allergy to penicillin. Patient has not been in the hospital in the last 30 days she lives at home. This would be a community-acquired pneumonia. Lactic acid was normal. Blood cultures were done ahead of time as precaution.  Patient should be able to be admitted to telemetry. Patient's potassium was a little low receive some oral potassium here that'll need to be followed. Most likely low due to the gastroenteritis that she had earlier in the week. In addition patient suffers troponin was slightly elevated follow-up troponins will be required. EKG showed no evidence of any acute cardiac changes.    I, Meris Reede, personally performed the services described in this documentation. All medical record entries made by the scribe were at my direction and in my presence.  I have reviewed the chart and discharge instructions and agree that the record reflects my personal performance and is accurate and complete. Maxim Bedel.  11/25/2014. 1:37 PM.       Vanetta Mulders, MD 11/25/14 [phone]

## 2014-11-25 NOTE — H&P (Signed)
[facility]          History and Physical    PCP:   Celedonio Savage, MD   EDP: Fredia Sorrow, MD  Chief Complaint:  Chest pain  HPI: Patient is a 81 year old woman with past medical history significant for GERD and hypertension who has been battling a case of gastroenteritis for the past week. She was seen in the emergency department 5 days ago for this issue she was treated symptomatically and discharged home. She states that since being released from the emergency department her nausea and vomiting have resolved, however she remains very weak with chest wall pain from "vomiting too much". This prompted her visit to the emergency department today. A chest x-ray was performed that showed a left lower lobe infiltrate and she was told she possibly had pneumonia, she was also shown to have a potassium of 2.9 and admission to our service for further evaluation and management was requested.  Allergies:   Allergies  Allergen Reactions  . Penicillins Swelling    Has patient had a PCN reaction causing immediate rash, facial/tongue/throat swelling, SOB or lightheadedness with hypotension: NO Has patient had a PCN reaction causing severe rash involving mucus membranes or skin necrosis: no Has patient had a PCN reaction that required hospitalization : no Has patient had a PCN reaction occurring within the last 10 years: no If all of the above answers are "NO", then may proceed with Cephalosporin use.      Past Medical History  Diagnosis Date  . Hypertension   . Reflux   . Hiatal hernia   . Anxiety   . Arthritis     Past Surgical History  Procedure Laterality Date  . Abdominal hysterectomy    . Appendectomy    . Hemorroidectomy    . Abdominal surgery      resection  . Tonsillectomy      Prior to Admission medications   Medication Sig Start Date End Date Taking? Authorizing Provider  ALPRAZolam (XANAX) 0.25 MG tablet Take 0.125 mg by mouth daily.   Yes  Historical Provider, MD  aspirin EC 81 MG tablet Take 81 mg by mouth daily as needed (heart).   Yes Historical Provider, MD  brimonidine (ALPHAGAN) 0.15 % ophthalmic solution Place 1 drop into both eyes 2 (two) times daily. 10/19/14  Yes Historical Provider, MD  cholecalciferol (VITAMIN D) 1000 UNITS tablet Take 1,000 Units by mouth daily.   Yes Historical Provider, MD  hydrochlorothiazide (MICROZIDE) 12.5 MG capsule Take 12.5 mg by mouth daily.   Yes Historical Provider, MD  metoprolol succinate (TOPROL-XL) 25 MG 24 hr tablet Take 12.5 mg by mouth daily.   Yes Historical Provider, MD  Multiple Vitamin (MULTIVITAMIN WITH MINERALS) TABS tablet Take 1 tablet by mouth daily.   Yes Historical Provider, MD  Omega-3 Fatty Acids (FISH OIL PO) Take 1 capsule by mouth daily.   Yes Historical Provider, MD  ondansetron (ZOFRAN) 4 MG tablet Take 1 tablet (4 mg total) by mouth every 6 (six) hours. 11/20/14  Yes Nat Christen, MD  potassium chloride SA (K-DUR,KLOR-CON) 20 MEQ tablet Take 1 tablet by mouth daily. 11/19/14  Yes Historical Provider, MD  ranitidine (ZANTAC) 150 MG tablet Take 150 mg by mouth 2 (two) times daily.   Yes Historical Provider, MD  PAZEO 0.7 % SOLN Place 1 drop into both eyes daily. 10/06/14   Historical Provider, MD    Social  History:  reports that she has never smoked. She does not have any smokeless tobacco history on file. She reports that she does not drink alcohol. Her drug history is not on file.  Family history: No history of heart disease, cancer or stroke.  Review of Systems:  Constitutional: Denies fever, chills, diaphoresis, appetite change and fatigue.  HEENT: Denies photophobia, eye pain, redness, hearing loss, ear pain, congestion, sore throat, rhinorrhea, sneezing, mouth sores, trouble swallowing, neck pain, neck stiffness and tinnitus.   Respiratory: Denies SOB, DOE, cough, chest tightness,  and wheezing.   Cardiovascular: Denies palpitations and leg swelling.    Gastrointestinal: Denies nausea, vomiting, abdominal pain, diarrhea, constipation, blood in stool and abdominal distention.  Genitourinary: Denies dysuria, urgency, frequency, hematuria, flank pain and difficulty urinating.  Endocrine: Denies: hot or cold intolerance, sweats, changes in hair or nails, polyuria, polydipsia. Musculoskeletal: Denies myalgias, back pain, joint swelling, arthralgias and gait problem.  Skin: Denies pallor, rash and wound.  Neurological: Denies dizziness, seizures, syncope, weakness, light-headedness, numbness and headaches.  Hematological: Denies adenopathy. Easy bruising, personal or family bleeding history  Psychiatric/Behavioral: Denies suicidal ideation, mood changes, confusion, nervousness, sleep disturbance and agitation   Physical Exam: Blood pressure 134/62, pulse 109, temperature 99.8 F (37.7 C), temperature source Oral, resp. rate 20, height '5\' 6"'  (1.676 m), weight 58.2 kg (128 lb 4.9 oz), SpO2 97 %. General: Alert, awake, oriented 3, no current distress HEENT: Normocephalic, atraumatic pupils equal round and reactive to light, wears corrective lenses, dry mucous membranes Neck: Supple, no JVD, no lymphadenopathy, no bruits, no goiter. Cardiovascular: Regular rate and rhythm, no murmurs, rubs or gallops. Lungs: Clear to auscultation bilaterally. Abdomen: Soft, nontender, nondistended, positive bowel sounds, no masses or organomegaly noted. Exam extremities: No clubbing, cyanosis or edema, positive pulses. Neurologic: Grossly intact and nonfocal.  Labs on Admission:  Results for orders placed or performed during the hospital encounter of 11/25/14 (from the past 48 hour(s))  Basic metabolic panel     Status: Abnormal   Collection Time: 11/25/14 10:38 AM  Result Value Ref Range   Sodium 139 135 - 145 mmol/L   Potassium 2.9 (L) 3.5 - 5.1 mmol/L   Chloride 101 101 - 111 mmol/L   CO2 26 22 - 32 mmol/L   Glucose, Bld 143 (H) 65 - 99 mg/dL   BUN 11 6 -  20 mg/dL   Creatinine, Ser 0.91 0.44 - 1.00 mg/dL   Calcium 9.0 8.9 - 10.3 mg/dL   GFR calc non Af Amer 53 (L) >60 mL/min   GFR calc Af Amer >60 >60 mL/min    Comment: (NOTE) The eGFR has been calculated using the CKD EPI equation. This calculation has not been validated in all clinical situations. eGFR's persistently <60 mL/min signify possible Chronic Kidney Disease.    Anion gap 12 5 - 15  CBC     Status: Abnormal   Collection Time: 11/25/14 10:38 AM  Result Value Ref Range   WBC 11.0 (H) 4.0 - 10.5 K/uL   RBC 4.07 3.87 - 5.11 MIL/uL   Hemoglobin 12.1 12.0 - 15.0 g/dL   HCT 36.2 36.0 - 46.0 %   MCV 88.9 78.0 - 100.0 fL   MCH 29.7 26.0 - 34.0 pg   MCHC 33.4 30.0 - 36.0 g/dL   RDW 13.4 11.5 - 15.5 %   Platelets 181 150 - 400 K/uL  Troponin I     Status: Abnormal   Collection Time: 11/25/14 10:38 AM  Result Value Ref  Range   Troponin I 0.04 (H) <0.031 ng/mL    Comment:        PERSISTENTLY INCREASED TROPONIN VALUES IN THE RANGE OF 0.04-0.49 ng/mL CAN BE SEEN IN:       -UNSTABLE ANGINA       -CONGESTIVE HEART FAILURE       -MYOCARDITIS       -CHEST TRAUMA       -ARRYHTHMIAS       -LATE PRESENTING MYOCARDIAL INFARCTION       -COPD   CLINICAL FOLLOW-UP RECOMMENDED.   Hepatic function panel     Status: Abnormal   Collection Time: 11/25/14 11:09 AM  Result Value Ref Range   Total Protein 7.4 6.5 - 8.1 g/dL   Albumin 3.4 (L) 3.5 - 5.0 g/dL   AST 24 15 - 41 U/L   ALT 16 14 - 54 U/L   Alkaline Phosphatase 51 38 - 126 U/L   Total Bilirubin 1.6 (H) 0.3 - 1.2 mg/dL   Bilirubin, Direct 0.2 0.1 - 0.5 mg/dL   Indirect Bilirubin 1.4 (H) 0.3 - 0.9 mg/dL  Lipase, blood     Status: None   Collection Time: 11/25/14 11:09 AM  Result Value Ref Range   Lipase 29 22 - 51 U/L  I-Stat CG4 Lactic Acid, ED     Status: None   Collection Time: 11/25/14 11:11 AM  Result Value Ref Range   Lactic Acid, Venous 1.61 0.5 - 2.0 mmol/L  Urinalysis, Routine w reflex microscopic (not at [facility])      Status: Abnormal   Collection Time: 11/25/14 12:30 PM  Result Value Ref Range   Color, Urine YELLOW YELLOW   APPearance CLEAR CLEAR   Specific Gravity, Urine <1.005 (L) 1.005 - 1.030   pH 6.5 5.0 - 8.0   Glucose, UA NEGATIVE NEGATIVE mg/dL   Hgb urine dipstick TRACE (A) NEGATIVE   Bilirubin Urine NEGATIVE NEGATIVE   Ketones, ur TRACE (A) NEGATIVE mg/dL   Protein, ur NEGATIVE NEGATIVE mg/dL   Urobilinogen, UA 0.2 0.0 - 1.0 mg/dL   Nitrite NEGATIVE NEGATIVE   Leukocytes, UA SMALL (A) NEGATIVE  Urine microscopic-add on     Status: Abnormal   Collection Time: 11/25/14 12:30 PM  Result Value Ref Range   Squamous Epithelial / LPF FEW (A) RARE   WBC, UA 0-2 <3 WBC/hpf   RBC / HPF 0-2 <3 RBC/hpf  Troponin I     Status: Abnormal   Collection Time: 11/25/14  1:38 PM  Result Value Ref Range   Troponin I 0.04 (H) <0.031 ng/mL    Comment:        PERSISTENTLY INCREASED TROPONIN VALUES IN THE RANGE OF 0.04-0.49 ng/mL CAN BE SEEN IN:       -UNSTABLE ANGINA       -CONGESTIVE HEART FAILURE       -MYOCARDITIS       -CHEST TRAUMA       -ARRYHTHMIAS       -LATE PRESENTING MYOCARDIAL INFARCTION       -COPD   CLINICAL FOLLOW-UP RECOMMENDED.   I-Stat CG4 Lactic Acid, ED     Status: None   Collection Time: 11/25/14  1:44 PM  Result Value Ref Range   Lactic Acid, Venous 1.09 0.5 - 2.0 mmol/L    Radiological Exams on Admission: Dg Chest 2 View  11/25/2014  CLINICAL DATA:  Left-sided chest pain and fever since yesterday EXAM: CHEST  2 VIEW COMPARISON:  11/20/2014 FINDINGS: There is a  small left pleural effusion. There is left basilar airspace disease. There is hazy right basilar airspace disease. There is no pneumothorax. The heart and mediastinal contours are unremarkable. There is a small hiatal hernia. The osseous structures are unremarkable. IMPRESSION: Left lower lobe airspace disease with a small pleural effusion most concerning for pneumonia. Hazy right basilar airspace disease likely  reflecting mild atelectasis. Electronically Signed   By: Kathreen Devoid   On: 11/25/2014 11:23   Ct Abdomen Pelvis W Contrast  11/25/2014  CLINICAL DATA:  Left-sided breast pain since yesterday. EXAM: CT ABDOMEN AND PELVIS WITH CONTRAST TECHNIQUE: Multidetector CT imaging of the abdomen and pelvis was performed using the standard protocol following bolus administration of intravenous contrast. CONTRAST:  15m OMNIPAQUE IOHEXOL 300 MG/ML SOLN, 1062mOMNIPAQUE IOHEXOL 300 MG/ML SOLN COMPARISON:  None FINDINGS: Lower chest: Lingular airspace disease most consistent with pneumonia. Small left pleural effusion. Left basilar atelectasis. Hepatobiliary: Multiple hypodense, fluid attenuating hepatic masses scattered throughout the liver most consistent with cysts with the largest measuring 2 cm. No intrahepatic or extrahepatic biliary ductal dilatation. Prior cholecystectomy. Pancreas: Normal. Spleen: Normal. Adrenals/Urinary Tract: Normal adrenal glands. 3.2 x 3.2 cm hypodense, fluid attenuating left interpolar renal mass most consistent with a cyst. Multiple hypodense, fluid attenuating right renal mass is with the largest measuring 11 mm in the posterior interpolar aspect most consistent with cysts. No urolithiasis or obstructive uropathy. Partially decompressed bladder. Stomach/Bowel: Large hiatal hernia. No bowel dilatation to suggest obstruction. Multiple small bowel air-fluid levels which may reflect mild enteritis. No pneumatosis, pneumoperitoneum or portal venous gas. No abdominal or pelvic free fluid. Vascular/Lymphatic: Normal caliber abdominal aorta with atherosclerosis. No abdominal or pelvic lymphadenopathy. Reproductive: Prior hysterectomy.  No adnexal mass. Other: No fluid collection or hematoma. Musculoskeletal: No lytic or sclerotic osseous lesion. No acute osseous abnormality. IMPRESSION: 1. Lingular airspace disease most consistent with pneumonia. 2. Multiple small bowel air-fluid levels as can be seen  with mild enteritis. 3. Large hiatal hernia. 4. Bilateral renal cysts. 5. Multiple hepatic cysts. Electronically Signed   By: HeKathreen Devoid On: 11/25/2014 11:53   [employer]  11/25/2014  CLINICAL DATA:  [age]ear old female with a history of right posterior knee swelling EXAM: ULTRASOUND RIGHT LOWER EXTREMITY LIMITED TECHNIQUE: Ultrasound examination of the lower extremity soft tissues was performed in the area of clinical concern. COMPARISON:  None FINDINGS: Directed grayscale and color duplex in the region of clinical concern, right popliteal fossa. Focus of heterogeneous fluid posterior to the right knee, 2 cm medial to the course of the artery and vein. This collection is mixed echogenicity measuring 3.9 cm x 2.5 cm x 3.3 cm. No internal color flow. No definite association with the knee joint is demonstrated. IMPRESSION: Targeted ultrasound of the right popliteal fossa demonstrates heterogeneous fluid collection medial to the course of the artery and vein. Most likely differential diagnosis is a complex Baker's cyst, although a seroma, hematoma, or possible abscess could have this appearance. Signed, JaDulcy FannyWaEarleen NewportDO [facility] Electronically Signed   By: JaCorrie Mckusick.O.   On: 11/25/2014 13:12   Dg Knee Complete 4 Views Right  11/25/2014  CLINICAL DATA:  Pain and swelling behind the knee. EXAM: RIGHT KNEE - COMPLETE 4+ VIEW COMPARISON:  None. FINDINGS: There is no evidence of fracture, dislocation, or joint effusion. There is no evidence of arthropathy or other focal bone abnormality. Soft tissues are unremarkable. IMPRESSION: No acute osseous injury of  the right knee. Electronically Signed   By: Kathreen Devoid   On: 11/25/2014 12:50    Assessment/Plan Principal Problem:   CAP (community acquired pneumonia) Active Problems:   Generalized weakness   Hypokalemia   Elevated troponin   Chest pain   Leukocytosis    Gastroenteritis   Sinus tachycardia (HCC)    Community-acquired pneumonia -Despite infiltrate on chest x-ray, she has not displayed fever, hypoxia, only mild leukocytosis. -Nonetheless we will elect to treat with Levaquin for 5 days.  Generalized weakness -Suspect from dehydration and electrolyte disturbances from recent GI illness. -We'll check TSH, B-12. -We will request PT evaluation.  Hypokalemia -Replete orally, check magnesium levels and replete as necessary.  Chest pain/elevated troponin -she describes this more as chest wall pain which I suspect is from profuse vomiting from last week. -Nonetheless she does have an elevated troponin of 0.04, we'll cycle troponins and will check 2-D echo. -She does not have a history of coronary artery disease.  Leukocytosis -Secondary to gastroenteritis plus minus pneumonia. -Monitor trend with antibiotics.  Sinus tachycardia -Likely secondary to dehydration and acute illness. -Should improve with IV fluids.  Hypertension -Continue metoprolol, will hold hydrochlorothiazide as we are currently giving her IV fluids and repleting her potassium.  DVT prophylaxis -Lovenox  CODE STATUS -Full code    Time Spent on Admission: 80 minutes  HERNANDEZ ACOSTA,ESTELA [facility] Pager: [phone] 11/25/2014, 4:41 PM

## 2014-11-25 NOTE — Progress Notes (Signed)
Informed Dr. Ardyth HarpsHernandez of patient tachycardic up to 140's.  No new orders at this time.  Will continue to monitor.

## 2014-11-26 ENCOUNTER — Observation Stay (HOSPITAL_BASED_OUTPATIENT_CLINIC_OR_DEPARTMENT_OTHER): Payer: Medicare Other

## 2014-11-26 DIAGNOSIS — R079 Chest pain, unspecified: Secondary | ICD-10-CM | POA: Diagnosis not present

## 2014-11-26 DIAGNOSIS — R7989 Other specified abnormal findings of blood chemistry: Secondary | ICD-10-CM | POA: Diagnosis not present

## 2014-11-26 DIAGNOSIS — R531 Weakness: Secondary | ICD-10-CM | POA: Diagnosis not present

## 2014-11-26 DIAGNOSIS — J189 Pneumonia, unspecified organism: Secondary | ICD-10-CM | POA: Diagnosis not present

## 2014-11-26 LAB — BASIC METABOLIC PANEL
Anion gap: 7 (ref 5–15)
BUN: 8 mg/dL (ref 6–20)
CHLORIDE: 107 mmol/L (ref 101–111)
CO2: 25 mmol/L (ref 22–32)
Calcium: 8 mg/dL — ABNORMAL LOW (ref 8.9–10.3)
Creatinine, Ser: 0.83 mg/dL (ref 0.44–1.00)
GFR calc non Af Amer: 59 mL/min — ABNORMAL LOW (ref >60)
Glucose, Bld: 135 mg/dL — ABNORMAL HIGH (ref 65–99)
Potassium: 3.7 mmol/L (ref 3.5–5.1)
SODIUM: 139 mmol/L (ref 135–145)

## 2014-11-26 LAB — CBC
HEMATOCRIT: 31.8 % — AB (ref 36.0–46.0)
Hemoglobin: 10.7 g/dL — ABNORMAL LOW (ref 12.0–15.0)
MCH: 30.1 pg (ref 26.0–34.0)
MCHC: 33.6 g/dL (ref 30.0–36.0)
MCV: 89.3 fL (ref 78.0–100.0)
Platelets: 162 10*3/uL (ref 150–400)
RBC: 3.56 MIL/uL — AB (ref 3.87–5.11)
RDW: 13.4 % (ref 11.5–15.5)
WBC: 10.3 10*3/uL (ref 4.0–10.5)

## 2014-11-26 LAB — TROPONIN I: Troponin I: 0.05 ng/mL — ABNORMAL HIGH (ref <0.031)

## 2014-11-26 MED ORDER — LEVOFLOXACIN 750 MG PO TABS: 750.0000 mg | ORAL_TABLET | Freq: Every day | ORAL | Status: DC

## 2014-11-26 NOTE — Progress Notes (Signed)
Physical therapy to room to ambulate patient with walker prior to patient being discharged.  Son in room with patient and PT.

## 2014-11-26 NOTE — Progress Notes (Signed)
Physical therapy worked with patient using walker.  [facility] and [facility] will obtain walker at discharge.  Patient left floor via wheelchair for discharge home in care of son.  Stable at discharge.

## 2014-11-26 NOTE — Care Management Note (Signed)
Case Management Note  Patient Details  Name: Cristela FeltMary H Parlett MRN: [id number] Date of Birth: Aug 26, 1919  Subjective/Objective:                    Action/Plan:   Expected Discharge Date:  11/26/14               Expected Discharge Plan:  Home/Self Care  In-House Referral:  NA  Discharge planning Services  CM Consult  Post Acute Care Choice:  NA Choice offered to:  NA  DME Arranged:    DME Agency:     HH Arranged:    HH Agency:     Status of Service:  Completed, signed off  Medicare Important Message Given:    Date Medicare IM Given:    Medicare IM give by:    Date Additional Medicare IM Given:    Additional Medicare Important Message give by:     If discussed at Long Length of Stay Meetings, dates discussed:    Additional Comments: Pt given scrlpt for rolling walker to obtain at DME store of choice.  Arlyss QueenBlackwell, Georgio Hattabaugh [place], RN 11/26/2014, 3:51 PM

## 2014-11-26 NOTE — Progress Notes (Signed)
Central Telemetry called and notified patient being discharged, telemetry removed.  IV access removed.  Discharge instructions reviewed with patient and her son.  Script given to son.

## 2014-11-26 NOTE — Care Management Note (Signed)
Case Management Note  Patient Details  Name: Dawn Estrada MRN: [id number] Date of Birth: 03/10/1919  Subjective/Objective:                  Pt admitted from home with CAP. Pt lives with her son and will return home at discharge. Pt is independent with ADL.s  Action/Plan: No CM needs anticipated.  Expected Discharge Date:  11/26/14               Expected Discharge Plan:  Home/Self Care  In-House Referral:  NA  Discharge planning Services  CM Consult  Post Acute Care Choice:  NA Choice offered to:  NA  DME Arranged:    DME Agency:     HH Arranged:    HH Agency:     Status of Service:  Completed, signed off  Medicare Important Message Given:    Date Medicare IM Given:    Medicare IM give by:    Date Additional Medicare IM Given:    Additional Medicare Important Message give by:     If discussed at Long Length of Stay Meetings, dates discussed:    Additional Comments:  Cheryl FlashBlackwell, Channel Papandrea Crowder, RN 11/26/2014, 10:40 AM

## 2014-11-26 NOTE — Discharge Summary (Signed)
Physician Discharge Summary  Dawn Estrada:[id number] DOB: May 20, 1919 DOA: 11/25/2014  PCP: Ernestine Conrad, MD  Admit date: 11/25/2014 Discharge date: 11/26/2014  Time spent: 45 minutes  Recommendations for Outpatient Follow-up:  -Will be discharged home today. -Advised to follow up with PCP in 2 weeks.   Discharge Diagnoses:  Principal Problem:   CAP (community acquired pneumonia) Active Problems:   Generalized weakness   Hypokalemia   Elevated troponin   Chest pain   Leukocytosis   Gastroenteritis   Sinus tachycardia (HCC)   Discharge Condition: Stable and improved  Filed Weights   11/25/14 1031 11/25/14 1519  Weight: 61.236 kg (135 lb) 58.2 kg (128 lb 4.9 oz)    History of present illness:  Patient is a 81 year old woman with past medical history significant for GERD and hypertension who has been battling a case of gastroenteritis for the past week. She was seen in the emergency department 5 days ago for this issue she was treated symptomatically and discharged home. She states that since being released from the emergency department her nausea and vomiting have resolved, however she remains very weak with chest wall pain from "vomiting too much". This prompted her visit to the emergency department today. A chest x-ray was performed that showed a left lower lobe infiltrate and she was told she possibly had pneumonia, she was also shown to have a potassium of 2.9 and admission to our service for further evaluation and management was requested.  Hospital Course:   Community-acquired pneumonia -Despite infiltrate on chest x-ray, she has not displayed fever, hypoxia, only mild leukocytosis. -Nonetheless we will elect to treat with Levaquin for 5 days.  Generalized weakness -Suspect from dehydration and electrolyte disturbances from recent GI illness. -Patient feels she is at her baseline level of functioning.  Hypokalemia -Repleted.  Chest pain/elevated  troponin -she describes this more as chest wall pain which I suspect is from profuse vomiting from last week. -Nonetheless she does have an elevated troponin of 0.04, we'll cycle troponins and will check 2-D echo (pending at time of DC). -She does not have a history of coronary artery disease.  Leukocytosis -Secondary to gastroenteritis plus minus pneumonia. -Resolved,  Sinus tachycardia -Likely secondary to dehydration and acute illness. -Resolved.  Hypertension -Well controlled.   Procedures:  None   Consultations:  None  Discharge Instructions  Discharge Instructions    Increase activity slowly    Complete by:  As directed             Medication List    STOP taking these medications        hydrochlorothiazide 12.5 MG capsule  Commonly known as:  MICROZIDE     ondansetron 4 MG tablet  Commonly known as:  ZOFRAN      TAKE these medications        ALPRAZolam 0.25 MG tablet  Commonly known as:  XANAX  Take 0.125 mg by mouth daily.     aspirin EC 81 MG tablet  Take 81 mg by mouth daily as needed (heart).     brimonidine 0.15 % ophthalmic solution  Commonly known as:  ALPHAGAN  Place 1 drop into both eyes 2 (two) times daily.     cholecalciferol 1000 UNITS tablet  Commonly known as:  VITAMIN D  Take 1,000 Units by mouth daily.     FISH OIL PO  Take 1 capsule by mouth daily.     levofloxacin 750 MG tablet  Commonly known as:  LEVAQUIN  Take 1 tablet (750 mg total) by mouth daily.  Start taking on:  11/27/2014     metoprolol succinate 25 MG 24 hr tablet  Commonly known as:  TOPROL-XL  Take 12.5 mg by mouth daily.     multivitamin with minerals Tabs tablet  Take 1 tablet by mouth daily.     PAZEO 0.7 % Soln  Generic drug:  Olopatadine HCl  Place 1 drop into both eyes daily.     potassium chloride SA 20 MEQ tablet  Commonly known as:  K-DUR,KLOR-CON  Take 1 tablet by mouth daily.     ranitidine 150 MG tablet  Commonly known as:  ZANTAC    Take 150 mg by mouth 2 (two) times daily.       Allergies  Allergen Reactions  . Penicillins Swelling    Has patient had a PCN reaction causing immediate rash, facial/tongue/throat swelling, SOB or lightheadedness with hypotension: NO Has patient had a PCN reaction causing severe rash involving mucus membranes or skin necrosis: no Has patient had a PCN reaction that required hospitalization : no Has patient had a PCN reaction occurring within the last 10 years: no If all of the above answers are "NO", then may proceed with Cephalosporin use.       Follow-up Information    Follow up with Ernestine ConradBLUTH, KIRK, MD. Schedule an appointment as soon as possible for a visit in 2 weeks.   Specialty:  Family Medicine   Contact information:   [address] [place] [id number] [phone]        The results of significant diagnostics from this hospitalization (including imaging, microbiology, ancillary and laboratory) are listed below for reference.    Significant Diagnostic Studies: Dg Chest 2 View  11/25/2014  CLINICAL DATA:  Left-sided chest pain and fever since yesterday EXAM: CHEST  2 VIEW COMPARISON:  11/20/2014 FINDINGS: There is a small left pleural effusion. There is left basilar airspace disease. There is hazy right basilar airspace disease. There is no pneumothorax. The heart and mediastinal contours are unremarkable. There is a small hiatal hernia. The osseous structures are unremarkable. IMPRESSION: Left lower lobe airspace disease with a small pleural effusion most concerning for pneumonia. Hazy right basilar airspace disease likely reflecting mild atelectasis. Electronically Signed   By: Elige KoHetal  Patel   On: 11/25/2014 11:23   Ct Abdomen Pelvis W Contrast  11/25/2014  CLINICAL DATA:  Left-sided breast pain since yesterday. EXAM: CT ABDOMEN AND PELVIS WITH CONTRAST TECHNIQUE: Multidetector CT imaging of the abdomen and pelvis was performed using the standard protocol following bolus  administration of intravenous contrast. CONTRAST:  50mL OMNIPAQUE IOHEXOL 300 MG/ML SOLN, 100mL OMNIPAQUE IOHEXOL 300 MG/ML SOLN COMPARISON:  None FINDINGS: Lower chest: Lingular airspace disease most consistent with pneumonia. Small left pleural effusion. Left basilar atelectasis. Hepatobiliary: Multiple hypodense, fluid attenuating hepatic masses scattered throughout the liver most consistent with cysts with the largest measuring 2 cm. No intrahepatic or extrahepatic biliary ductal dilatation. Prior cholecystectomy. Pancreas: Normal. Spleen: Normal. Adrenals/Urinary Tract: Normal adrenal glands. 3.2 x 3.2 cm hypodense, fluid attenuating left interpolar renal mass most consistent with a cyst. Multiple hypodense, fluid attenuating right renal mass is with the largest measuring 11 mm in the posterior interpolar aspect most consistent with cysts. No urolithiasis or obstructive uropathy. Partially decompressed bladder. Stomach/Bowel: Large hiatal hernia. No bowel dilatation to suggest obstruction. Multiple small bowel air-fluid levels which may reflect mild enteritis. No pneumatosis, pneumoperitoneum or portal venous gas. No abdominal or pelvic  free fluid. Vascular/Lymphatic: Normal caliber abdominal aorta with atherosclerosis. No abdominal or pelvic lymphadenopathy. Reproductive: Prior hysterectomy.  No adnexal mass. Other: No fluid collection or hematoma. Musculoskeletal: No lytic or sclerotic osseous lesion. No acute osseous abnormality. IMPRESSION: 1. Lingular airspace disease most consistent with pneumonia. 2. Multiple small bowel air-fluid levels as can be seen with mild enteritis. 3. Large hiatal hernia. 4. Bilateral renal cysts. 5. Multiple hepatic cysts. Electronically Signed   By: Elige Ko   On: 11/25/2014 11:53   [place] Extrem Low Right Ltd  11/25/2014  CLINICAL DATA:  81 year old female with a history of right posterior knee swelling EXAM: ULTRASOUND RIGHT LOWER EXTREMITY LIMITED TECHNIQUE: Ultrasound  examination of the lower extremity soft tissues was performed in the area of clinical concern. COMPARISON:  None FINDINGS: Directed grayscale and color duplex in the region of clinical concern, right popliteal fossa. Focus of heterogeneous fluid posterior to the right knee, 2 cm medial to the course of the artery and vein. This collection is mixed echogenicity measuring 3.9 cm x 2.5 cm x 3.3 cm. No internal color flow. No definite association with the knee joint is demonstrated. IMPRESSION: Targeted ultrasound of the right popliteal fossa demonstrates heterogeneous fluid collection medial to the course of the artery and vein. Most likely differential diagnosis is a complex Baker's cyst, although a seroma, hematoma, or possible abscess could have this appearance. Signed, Yvone Neu. Loreta Ave, DO [facility]iology Electronically Signed   By: Gilmer Mor D.O.   On: 11/25/2014 13:12   Dg Knee Complete 4 Views Right  11/25/2014  CLINICAL DATA:  Pain and swelling behind the knee. EXAM: RIGHT KNEE - COMPLETE 4+ VIEW COMPARISON:  None. FINDINGS: There is no evidence of fracture, dislocation, or joint effusion. There is no evidence of arthropathy or other focal bone abnormality. Soft tissues are unremarkable. IMPRESSION: No acute osseous injury of the right knee. Electronically Signed   By: Elige Ko   On: 11/25/2014 12:50   Dg Abd Acute W/chest  11/20/2014  CLINICAL DATA:  Epigastric pain with nausea and vomiting for 1 day EXAM: DG ABDOMEN ACUTE W/ 1V CHEST COMPARISON:  None. FINDINGS: PA chest: No edema or consolidation. Heart size and pulmonary vascularity are normal. No adenopathy. There is a focal hiatal hernia. Supine and upright abdomen: There is moderate stool in the colon. No bowel dilatation or air-fluid level suggesting obstruction. No free air. There are phleboliths in the pelvis. IMPRESSION: Hiatal hernia present. Overall bowel gas pattern unremarkable  without obstruction or free air. No lung edema or consolidation. Electronically Signed   By: Bretta Bang III M.D.   On: 11/20/2014 11:21    Microbiology: Recent Results (from the past 240 hour(s))  Culture, blood (routine x 2)     Status: None (Preliminary result)   Collection Time: 11/25/14  1:36 PM  Result Value Ref Range Status   Specimen Description BLOOD LEFT ANTECUBITAL  Final   Special Requests   Final    BOTTLES DRAWN AEROBIC AND ANAEROBIC AEB=6CC ANA=4CC   Culture NO GROWTH < 24 HOURS  Final   Report Status PENDING  Incomplete  Culture, blood (routine x 2)     Status: None (Preliminary result)   Collection Time: 11/25/14  1:53 PM  Result Value Ref Range Status   Specimen Description BLOOD RIGHT ANTECUBITAL  Final   Special Requests BOTTLES DRAWN AEROBIC ONLY 4CC  Final   Culture NO GROWTH < 24 HOURS  Final  Report Status PENDING  Incomplete     Labs: Basic Metabolic Panel:  Recent Labs Lab 11/20/14 1015 11/25/14 1038 11/25/14 1338 11/26/14 0457  NA 139 139  --  139  K 4.3 2.9*  --  3.7  CL 103 101  --  107  CO2 26 26  --  25  GLUCOSE 179* 143*  --  135*  BUN 18 11  --  8  CREATININE 1.13* 0.91  --  0.83  CALCIUM 9.5 9.0  --  8.0*  MG  --   --  1.6*  --    Liver Function Tests:  Recent Labs Lab 11/20/14 1015 11/25/14 1109  AST 31 24  ALT 22 16  ALKPHOS 64 51  BILITOT 1.2 1.6*  PROT 7.7 7.4  ALBUMIN 3.9 3.4*    Recent Labs Lab 11/20/14 1015 11/25/14 1109  LIPASE 31 29   No results for input(s): AMMONIA in the last 168 hours. CBC:  Recent Labs Lab 11/20/14 1015 11/25/14 1038 11/26/14 0457  WBC 10.5 11.0* 10.3  NEUTROABS 9.2*  --   --   HGB 14.0 12.1 10.7*  HCT 41.9 36.2 31.8*  MCV 90.5 88.9 89.3  PLT 182 181 162   Cardiac Enzymes:  Recent Labs Lab 11/25/14 1038 11/25/14 1338 11/25/14 1709 11/25/14 2254 11/26/14 0457  TROPONINI 0.04* 0.04* 0.05* 0.06* 0.05*   BNP: BNP (last 3 results) No results for input(s): BNP in  the last 8760 hours.  ProBNP (last 3 results) No results for input(s): PROBNP in the last 8760 hours.  CBG: No results for input(s): GLUCAP in the last 168 hours.     SignedChaya Jan  [facility] Pager: [phone] 11/26/2014, 1:39 PM

## 2014-11-26 NOTE — Care Management Note (Signed)
Case Management Note  Patient Details  Name: Cristela FeltMary H Jankovich MRN: [id number] Date of Birth: 1919/04/13  Subjective/Objective:                    Action/Plan:   Expected Discharge Date:  11/26/14               Expected Discharge Plan:  Home/Self Care  In-House Referral:  NA  Discharge planning Services  CM Consult  Post Acute Care Choice:  NA Choice offered to:  NA  DME Arranged:    DME Agency:     HH Arranged:    HH Agency:     Status of Service:  Completed, signed off  Medicare Important Message Given:    Date Medicare IM Given:    Medicare IM give by:    Date Additional Medicare IM Given:    Additional Medicare Important Message give by:     If discussed at Long Length of Stay Meetings, dates discussed:    Additional Comments: Pt discharged home today.No CM needs noted. Arlyss QueenBlackwell, Dominyk Law Etnarowder, RN 11/26/2014, 12:48 PM

## 2014-11-26 NOTE — Progress Notes (Signed)
*  PRELIMINARY RESULTS* Echocardiogram 2D Echocardiogram has been performed.  Dawn Estrada, Dawn Estrada 11/26/2014, 3:29 PM

## 2014-11-26 NOTE — Evaluation (Signed)
Physical Therapy Evaluation Patient Details Name: Dawn Estrada MRN: [id number] DOB: 1919-05-21 Today's Date: 11/26/2014   History of Present Illness  Patient is a 81 year old woman with past medical history significant for GERD and hypertension who has been battling a case of gastroenteritis for the past week. She was seen in the emergency department 5 days ago for this issue she was treated symptomatically and discharged home. She states that since being released from the emergency department her nausea and vomiting have resolved, however she remains very weak with chest wall pain from "vomiting too much". This prompted her visit to the emergency department today. A chest x-ray was performed that showed a left lower lobe infiltrate and she was told she possibly had pneumonia, she was also shown to have a potassium of 2.9 and admission to our service for further evaluation and management was requested.  Clinical Impression  Ms. Mauss normally ambulates with a cane.  We used a cane initially, however, she did not appear steady.  Both therapist and patient feel that pt will benefit from a rolling walker to use initially at home for the first week or so until she has improved strength.    Follow Up Recommendations No PT follow up    Equipment Recommendations  Rolling walker with 5" wheels    Recommendations for Other Services   none    Precautions / Restrictions Precautions Precautions: None Restrictions Weight Bearing Restrictions: No      Mobility  Bed Mobility Overal bed mobility: Modified Independent                Transfers Overall transfer level: Modified independent                  Ambulation/Gait Ambulation/Gait assistance: Modified independent (Device/Increase time) Ambulation Distance (Feet): 120 Feet Assistive device: Rolling walker (2 wheeled) Gait Pattern/deviations: WFL(Within Functional Limits)   Gait velocity interpretation: at or above  normal speed for age/gender          Pertinent Vitals/Pain Pain Assessment: No/denies pain    Home Living Family/patient expects to be discharged to:: Private residence Living Arrangements: Children Available Help at Discharge: Available PRN/intermittently Type of Home: House Home Access: Stairs to enter   Secretary/administrator of Steps: 1 Home Layout: One level Home Equipment: Cane - single point      rior Function Level of Independence: Independent with assistive device(s)                  Extremity/Trunk Assessment               Lower Extremity Assessment: Generalized weakness         Communication   Communication: No difficulties  Cognition Arousal/Alertness: Awake/alert Behavior During Therapy: WFL for tasks assessed/performed Overall Cognitive Status: Within Functional Limits for tasks assessed                               Assessment/Plan    PT Assessment Patent does not need any further PT services  PT Diagnosis Generalized weakness   PT Problem List    PT Treatment Interventions     PT Goals (Current goals can be found in the Care Plan section)      Frequency     Barriers to discharge        Co-evaluation               End of Session Equipment Utilized  During Treatment: Gait belt Activity Tolerance: Patient tolerated treatment well Patient left: in bed      Functional Assessment Tool Used: clincial judgement Functional Limitation: Mobility: Walking and moving around Mobility: Walking and Moving Around Current Status ([id number](G8978): At least 1 percent but less than 20 percent impaired, limited or restricted Mobility: Walking and Moving Around Goal Status [phone](G8979): At least 1 percent but less than 20 percent impaired, limited or restricted Mobility: Walking and Moving Around Discharge Status [phone](G8980): At least 1 percent but less than 20 percent impaired, limited or restricted    Time: [id number] PT Time Calculation  (min) (ACUTE ONLY): 18 min   Charges:   PT Evaluation $Initial PT Evaluation Tier I: 1 Procedure     PT G Codes:   PT G-Codes **NOT FOR INPATIENT CLASS** Functional Assessment Tool Used: clincial judgement Functional Limitation: Mobility: Walking and moving around Mobility: Walking and Moving Around Current Status ([id number](G8978): At least 1 percent but less than 20 percent impaired, limited or restricted Mobility: Walking and Moving Around Goal Status [phone](G8979): At least 1 percent but less than 20 percent impaired, limited or restricted Mobility: Walking and Moving Around Discharge Status [phone](G8980): At least 1 percent but less than 20 percent impaired, limited or restricted    Virgina OrganCynthia Yaminah Clayborn, PT CLT [phone] 11/26/2014, 3:45 PM

## 2014-11-30 LAB — CULTURE, BLOOD (ROUTINE X 2)
CULTURE: NO GROWTH
Culture: NO GROWTH

## 2015-05-02 ENCOUNTER — Emergency Department (HOSPITAL_COMMUNITY)
Admission: EM | Admit: 2015-05-02 | Discharge: 2015-05-02 | Disposition: A | Discharge status: Home / Self Care | Payer: Medicare Other | Attending: Emergency Medicine | Admitting: Emergency Medicine

## 2015-05-02 ENCOUNTER — Emergency Department (HOSPITAL_COMMUNITY): Payer: Medicare Other

## 2015-05-02 ENCOUNTER — Encounter (HOSPITAL_COMMUNITY): Payer: Self-pay | Admitting: Emergency Medicine

## 2015-05-02 DIAGNOSIS — M1990 Unspecified osteoarthritis, unspecified site: Secondary | ICD-10-CM | POA: Diagnosis not present

## 2015-05-02 DIAGNOSIS — Z79899 Other long term (current) drug therapy: Secondary | ICD-10-CM | POA: Insufficient documentation

## 2015-05-02 DIAGNOSIS — I10 Essential (primary) hypertension: Secondary | ICD-10-CM | POA: Diagnosis not present

## 2015-05-02 DIAGNOSIS — M545 Low back pain: Secondary | ICD-10-CM | POA: Diagnosis present

## 2015-05-02 DIAGNOSIS — M5441 Lumbago with sciatica, right side: Secondary | ICD-10-CM

## 2015-05-02 MED ORDER — NAPROXEN 250 MG PO TABS: 250.0000 mg | ORAL_TABLET | Freq: Two times a day (BID) | ORAL | Status: DC

## 2015-05-02 MED ORDER — PREDNISONE 10 MG (21) PO TBPK: 10.0000 mg | ORAL_TABLET | Freq: Every day | ORAL | Status: DC

## 2015-05-02 NOTE — ED Notes (Signed)
Pt states she has been having low back pain on right side going down leg since Friday when she was making the bed and lifted the mattress.

## 2015-05-02 NOTE — ED Provider Notes (Signed)
CSN: [id number]     Arrival date & time 05/02/15  1327 History   First MD Initiated Contact with Patient 05/02/15 1344     Chief Complaint  Patient presents with  . Back Pain     (Consider location/radiation/quality/duration/timing/severity/associated sxs/prior Treatment) HPI.....Marland Kitchen. low back pain with radiation to the right lower extremity after manipulating her mattress at home on Friday. Patient is amblatory. No bowel or bladder incontinence. No previous back problems.. His pain is mild to moderate.  Past Medical History  Diagnosis Date  . Hypertension   . Reflux   . Hiatal hernia   . Anxiety   . Arthritis    Past Surgical History  Procedure Laterality Date  . Abdominal hysterectomy    . Appendectomy    . Hemorroidectomy    . Tonsillectomy    . Abdominal surgery      resection   History reviewed. No pertinent family history. Social History  Substance Use Topics  . Smoking status: Never Smoker   . Smokeless tobacco: None  . Alcohol Use: No   OB History    No data available     Review of Systems  All other systems reviewed and are negative.     Allergies  Penicillins  Home Medications   Prior to Admission medications   Medication Sig Start Date End Date Taking? Authorizing Provider  ALPRAZolam (XANAX) 0.25 MG tablet Take 0.25 mg by mouth 3 (three) times daily as needed for anxiety.    Yes Historical Provider, MD  brimonidine (ALPHAGAN) 0.15 % ophthalmic solution Place 1 drop into both eyes 2 (two) times daily. 10/19/14  Yes Historical Provider, MD  cholecalciferol (VITAMIN D) 1000 UNITS tablet Take 1,000 Units by mouth daily.   Yes Historical Provider, MD  hydrochlorothiazide (MICROZIDE) 12.5 MG capsule Take 12.5 mg by mouth daily.   Yes Historical Provider, MD  metoprolol succinate (TOPROL-XL) 25 MG 24 hr tablet Take 12.5 mg by mouth daily.   Yes Historical Provider, MD  PAZEO 0.7 % SOLN Place 1 drop into both eyes daily. 10/06/14  Yes Historical Provider, MD   potassium chloride SA (K-DUR,KLOR-CON) 20 MEQ tablet Take 1 tablet by mouth daily. 11/19/14  Yes Historical Provider, MD  ranitidine (ZANTAC) 150 MG tablet Take 150 mg by mouth 2 (two) times daily.   Yes Historical Provider, MD  naproxen (NAPROSYN) 250 MG tablet Take 1 tablet (250 mg total) by mouth 2 (two) times daily with a meal. 05/02/15   Donnetta HutchingBrian Kade Rickels, MD  predniSONE (STERAPRED UNI-PAK 21 TAB) 10 MG (21) TBPK tablet Take 1 tablet (10 mg total) by mouth daily. 05/02/15   Donnetta HutchingBrian Masiya Claassen, MD   BP 138/53 mmHg  Pulse 116  Temp(Src) 98 F (36.7 C) (Oral)  Resp 18  SpO2 96% Physical Exam  Constitutional: She is oriented to person, place, and time. She appears well-developed and well-nourished.  HENT:  Head: Normocephalic and atraumatic.  Eyes: Conjunctivae and EOM are normal. Pupils are equal, round, and reactive to light.  Neck: Normal range of motion. Neck supple.  Cardiovascular: Normal rate and regular rhythm.   Pulmonary/Chest: Effort normal and breath sounds normal.  Abdominal: Soft. Bowel sounds are normal.  Musculoskeletal:  Minimal tenderness lower lumbar spine. Pain with straight leg raise on right.  Neurological: She is alert and oriented to person, place, and time.  Skin: Skin is warm and dry.  Psychiatric: She has a normal mood and affect. Her behavior is normal.  Nursing note and vitals reviewed.  ED Course  Procedures (including critical care time) Labs Review Labs Reviewed - No data to display  Imaging Review Dg Lumbar Spine Complete  05/02/2015  CLINICAL DATA:  Low back pain radiating down right leg, lifting injury EXAM: LUMBAR SPINE - COMPLETE 4+ VIEW COMPARISON:  CT abdomen pelvis dated 11/25/2014 FINDINGS: Five lumbar type vertebral bodies. Normal lumbar lordosis. No evidence of fracture or dislocation. Vertebral body heights and intervertebral disc spaces are maintained. Mild multilevel degenerative changes. Visualized bony pelvis appears intact. IMPRESSION: No  fracture or dislocation is seen. Mild degenerative changes. Electronically Signed   By: Charline Bills M.D.   On: 05/02/2015 14:46   I have personally reviewed and evaluated these images and lab results as part of my medical decision-making.   EKG Interpretation None      MDM   Final diagnoses:  Low back pain with radiation, right    Patient has radicular pain to right leg. She is stable. Discharge medications Naprosyn 250 mg and prednisone 10 mg. Discussed findings with the patient and her son. She may need an MRI if symptoms do not improve.    Donnetta Hutching, MD 05/02/15 1504

## 2015-05-02 NOTE — Discharge Instructions (Signed)
X-ray shows some mild degeneration of your lower spine. Prescription for pain medicine and anti-inflammatory medicine. Ice pack. If symptoms persist, you may need an MRI of your lower back. You can discuss this with your primary care doctor.

## 2015-07-05 ENCOUNTER — Emergency Department (HOSPITAL_COMMUNITY): Payer: Medicare Other

## 2015-07-05 ENCOUNTER — Emergency Department (HOSPITAL_COMMUNITY)
Admission: EM | Admit: 2015-07-05 | Discharge: 2015-07-05 | Disposition: A | Discharge status: Home / Self Care | Payer: Medicare Other | Attending: Emergency Medicine | Admitting: Emergency Medicine

## 2015-07-05 ENCOUNTER — Encounter (HOSPITAL_COMMUNITY): Payer: Self-pay | Admitting: Emergency Medicine

## 2015-07-05 DIAGNOSIS — M1990 Unspecified osteoarthritis, unspecified site: Secondary | ICD-10-CM | POA: Diagnosis not present

## 2015-07-05 DIAGNOSIS — R0789 Other chest pain: Secondary | ICD-10-CM

## 2015-07-05 DIAGNOSIS — I10 Essential (primary) hypertension: Secondary | ICD-10-CM | POA: Diagnosis not present

## 2015-07-05 DIAGNOSIS — Z79899 Other long term (current) drug therapy: Secondary | ICD-10-CM | POA: Insufficient documentation

## 2015-07-05 LAB — COMPREHENSIVE METABOLIC PANEL
ALBUMIN: 3.8 g/dL (ref 3.5–5.0)
ALT: 14 U/L (ref 14–54)
ANION GAP: 6 (ref 5–15)
AST: 22 U/L (ref 15–41)
Alkaline Phosphatase: 56 U/L (ref 38–126)
BILIRUBIN TOTAL: 0.6 mg/dL (ref 0.3–1.2)
BUN: 13 mg/dL (ref 6–20)
CHLORIDE: 107 mmol/L (ref 101–111)
CO2: 25 mmol/L (ref 22–32)
Calcium: 9.2 mg/dL (ref 8.9–10.3)
Creatinine, Ser: 0.97 mg/dL (ref 0.44–1.00)
GFR calc Af Amer: 56 mL/min — ABNORMAL LOW (ref >60)
GFR calc non Af Amer: 48 mL/min — ABNORMAL LOW (ref >60)
GLUCOSE: 125 mg/dL — AB (ref 65–99)
POTASSIUM: 3.3 mmol/L — AB (ref 3.5–5.1)
SODIUM: 138 mmol/L (ref 135–145)
TOTAL PROTEIN: 7.4 g/dL (ref 6.5–8.1)

## 2015-07-05 LAB — CBC
HEMATOCRIT: 37.5 % (ref 36.0–46.0)
Hemoglobin: 12.3 g/dL (ref 12.0–15.0)
MCH: 29.5 pg (ref 26.0–34.0)
MCHC: 32.8 g/dL (ref 30.0–36.0)
MCV: 89.9 fL (ref 78.0–100.0)
PLATELETS: 203 10*3/uL (ref 150–400)
RBC: 4.17 MIL/uL (ref 3.87–5.11)
RDW: 14.1 % (ref 11.5–15.5)
WBC: 6.3 10*3/uL (ref 4.0–10.5)

## 2015-07-05 LAB — TROPONIN I
Troponin I: <0.03 ng/mL (ref <0.031)
Troponin I: <0.03 ng/mL (ref <0.031)

## 2015-07-05 MED ORDER — ACETAMINOPHEN 325 MG PO TABS
650.0000 mg | ORAL_TABLET | Freq: Once | ORAL | Status: AC
  Administered 2015-07-05: 650 mg via ORAL
  Filled 2015-07-05: qty 2

## 2015-07-05 MED ORDER — POTASSIUM CHLORIDE CRYS ER 20 MEQ PO TBCR
40.0000 meq | EXTENDED_RELEASE_TABLET | Freq: Once | ORAL | Status: AC
  Administered 2015-07-05: 40 meq via ORAL
  Filled 2015-07-05: qty 2

## 2015-07-05 NOTE — Discharge Instructions (Signed)
Take over the counter tylenol, as directed on packaging, as needed for discomfort. Apply moist heat or ice to the area(s) of discomfort, for 15 minutes at a time, several times per day for the next few days.  Do not fall asleep on a heating or ice pack.  Call your regular medical doctor tomorrow to schedule a follow up appointment in the next 2 days.  Return to the Emergency Department immediately if worsening. ° °

## 2015-07-05 NOTE — ED Notes (Signed)
Pt reports chest pain that started last night, isolates to just under the left breast. Pt denies SOB, dizziness or n/v. Pt reports pain is worse with mvmt.

## 2015-07-05 NOTE — ED Provider Notes (Signed)
CSN: [id number]     Arrival date & time 07/05/15  1424 History   First MD Initiated Contact with Patient 07/05/15 1451     Chief Complaint  Patient presents with  . Chest Pain     HPI Pt was seen at 1500. Per pt and her family, c/o gradual onset and persistence of constant left sided chest wall "pain" that began yesterday afternoon approximately 1500. Pt describes the pain as "aching," "constant," worsens with palpation of the area and body position changes. Cannot recall injury. Denies palpitations, no SOB/cough, no abd pain, no N/V/D, no fevers, no back pain.    Past Medical History  Diagnosis Date  . Hypertension   . Reflux   . Hiatal hernia   . Anxiety   . Arthritis    Past Surgical History  Procedure Laterality Date  . Abdominal hysterectomy    . Appendectomy    . Hemorroidectomy    . Tonsillectomy    . Abdominal surgery      resection    Social History  Substance Use Topics  . Smoking status: Never Smoker   . Smokeless tobacco: None  . Alcohol Use: No   OB History    Gravida Para Term Preterm AB TAB SAB Ectopic Multiple Living   3 3 3              Review of Systems ROS: Statement: All systems negative except as marked or noted in the HPI; Constitutional: Negative for fever and chills. ; ; Eyes: Negative for eye pain, redness and discharge. ; ; ENMT: Negative for ear pain, hoarseness, nasal congestion, sinus pressure and sore throat. ; ; Cardiovascular: Negative for palpitations, diaphoresis, dyspnea and peripheral edema. ; ; Respiratory: Negative for cough, wheezing and stridor. ; ; Gastrointestinal: Negative for nausea, vomiting, diarrhea, abdominal pain, blood in stool, hematemesis, jaundice and rectal bleeding. . ; ; Genitourinary: Negative for dysuria, flank pain and hematuria. ; ; Musculoskeletal: +chest wall pain. Negative for back pain and neck pain. Negative for swelling and trauma.; ; Skin: Negative for pruritus, rash, abrasions, blisters, bruising and skin  lesion.; ; Neuro: Negative for headache, lightheadedness and neck stiffness. Negative for weakness, altered level of consciousness, altered mental status, extremity weakness, paresthesias, involuntary movement, seizure and syncope.      Allergies  Penicillins  Home Medications   Prior to Admission medications   Medication Sig Start Date End Date Taking? Authorizing Provider  ALPRAZolam (XANAX) 0.25 MG tablet Take 0.25 mg by mouth 3 (three) times daily as needed for anxiety.     Historical Provider, MD  brimonidine (ALPHAGAN) 0.15 % ophthalmic solution Place 1 drop into both eyes 2 (two) times daily. 10/19/14   Historical Provider, MD  cholecalciferol (VITAMIN D) 1000 UNITS tablet Take 1,000 Units by mouth daily.    Historical Provider, MD  hydrochlorothiazide (MICROZIDE) 12.5 MG capsule Take 12.5 mg by mouth daily.    Historical Provider, MD  metoprolol succinate (TOPROL-XL) 25 MG 24 hr tablet Take 12.5 mg by mouth daily.    Historical Provider, MD  naproxen (NAPROSYN) 250 MG tablet Take 1 tablet (250 mg total) by mouth 2 (two) times daily with a meal. 05/02/15   Donnetta HutchingBrian Cook, MD  PAZEO 0.7 % SOLN Place 1 drop into both eyes daily. 10/06/14   Historical Provider, MD  potassium chloride SA (K-DUR,KLOR-CON) 20 MEQ tablet Take 1 tablet by mouth daily. 11/19/14   Historical Provider, MD  predniSONE (STERAPRED UNI-PAK 21 TAB) 10 MG (21) TBPK tablet  Take 1 tablet (10 mg total) by mouth daily. 05/02/15   Donnetta Hutching, MD  ranitidine (ZANTAC) 150 MG tablet Take 150 mg by mouth 2 (two) times daily.    Historical Provider, MD   BP 150/71 mmHg  Pulse 114  Temp(Src) 98.2 F (36.8 C) (Oral)  Resp 23  Ht 5\' 6"  (1.676 m)  Wt 138 lb (62.596 kg)  BMI 22.28 kg/m2  SpO2 97%  BP 134/64 mmHg  Pulse 76  Temp(Src) 98.2 F (36.8 C) (Oral)  Resp 24  Ht 5\' 6"  (1.676 m)  Wt 138 lb (62.596 kg)  BMI 22.28 kg/m2  SpO2 98%  Physical Exam  1505: Physical examination:  Nursing notes reviewed; Vital signs and O2  SAT reviewed;  Constitutional: Well developed, Well nourished, Well hydrated, In no acute distress; Head:  Normocephalic, atraumatic; Eyes: EOMI, PERRL, No scleral icterus; ENMT: Mouth and pharynx normal, Mucous membranes moist; Neck: Supple, Full range of motion, No lymphadenopathy; Cardiovascular: Tachycardic rate and rhythm, No gallop; Respiratory: Breath sounds clear & equal bilaterally, No wheezes.  Speaking full sentences with ease, Normal respiratory effort/excursion; Chest: +left lower anterior-lateral chest wall tender to palp. No soft tissue crepitus, no rash, no deformity. Movement normal; Abdomen: Soft, Nontender, Nondistended, Normal bowel sounds; Genitourinary: No CVA tenderness; Extremities: Pulses normal, No tenderness, No edema, No calf edema or asymmetry.; Neuro: AA&Ox3, Major CN grossly intact.  Speech clear. No gross focal motor or sensory deficits in extremities.; Skin: Color normal, Warm, Dry.   ED Course  Procedures (including critical care time) Labs Review  Imaging Review  I have personally reviewed and evaluated these images and lab results as part of my medical decision-making.   EKG Interpretation   Date/Time:  Monday Jul 05 2015 14:30:45 EDT Ventricular Rate:  113 PR Interval:  164 QRS Duration: 83 QT Interval:  334 QTC Calculation: 458 R Axis:   -28 Text Interpretation:  Sinus tachycardia Borderline left axis deviation  Inferior infarct , old Baseline wander When compared with ECG of  11/25/2014 No significant change was found Confirmed by [facility]  MD,  Nicholos Johns [id number]) on 07/05/2015 3:18:11 PM      MDM  MDM Reviewed: previous chart, nursing note and vitals Reviewed previous: labs and ECG Interpretation: labs, ECG and x-ray      Results for orders placed or performed during the hospital encounter of 07/05/15  CBC  Result Value Ref Range   WBC 6.3 4.0 - 10.5 K/uL   RBC 4.17 3.87 - 5.11 MIL/uL   Hemoglobin 12.3 12.0 - 15.0 g/dL   HCT 60.4 54.0 -  98.1 %   MCV 89.9 78.0 - 100.0 fL   MCH 29.5 26.0 - 34.0 pg   MCHC 32.8 30.0 - 36.0 g/dL   RDW 19.1 47.8 - 29.5 %   Platelets 203 150 - 400 K/uL  Comprehensive metabolic panel  Result Value Ref Range   Sodium 138 135 - 145 mmol/L   Potassium 3.3 (L) 3.5 - 5.1 mmol/L   Chloride 107 101 - 111 mmol/L   CO2 25 22 - 32 mmol/L   Glucose, Bld 125 (H) 65 - 99 mg/dL   BUN 13 6 - 20 mg/dL   Creatinine, Ser 6.21 0.44 - 1.00 mg/dL   Calcium 9.2 8.9 - 30.8 mg/dL   Total Protein 7.4 6.5 - 8.1 g/dL   Albumin 3.8 3.5 - 5.0 g/dL   AST 22 15 - 41 U/L   ALT 14 14 - 54 U/L   Alkaline  Phosphatase 56 38 - 126 U/L   Total Bilirubin 0.6 0.3 - 1.2 mg/dL   GFR calc non Af Amer 48 (L) >60 mL/min   GFR calc Af Amer 56 (L) >60 mL/min   Anion gap 6 5 - 15  Troponin I  Result Value Ref Range   Troponin I <0.03 <0.031 ng/mL  Troponin I  Result Value Ref Range   Troponin I <0.03 <0.031 ng/mL   Dg Chest 2 View 07/05/2015  CLINICAL DATA:  Chest pain.  Pneumonia few months ago. EXAM: CHEST  2 VIEW COMPARISON:  11/25/2014 FINDINGS: The lungs are hyperinflated likely secondary to COPD. There is no focal parenchymal opacity. There is no pleural effusion or pneumothorax. The heart and mediastinal contours are unremarkable. The osseous structures are unremarkable. IMPRESSION: No active cardiopulmonary disease. Electronically Signed   By: Elige Ko   On: 07/05/2015 15:16    1800:  Pt feels better after tylenol and wants to go home now. Doubt PE as cause for symptoms with low risk Wells.  Doubt ACS as cause for symptoms with normal troponin x2 and unchanged EKG from previous after 24+ hours of constant symptoms. Dx and testing d/w pt and family.  Questions answered.  Verb understanding, agreeable to d/c home with outpt f/u.   Samuel Jester, DO 07/07/15 2129

## 2017-05-27 IMAGING — DX DG LUMBAR SPINE COMPLETE 4+V
5 series · 5 of 5 positions shown · non-contrast
Comparison: CT abdomen pelvis dated 11/25/2014

CLINICAL DATA: Low back pain radiating down right leg, lifting
injury

EXAM:
LUMBAR SPINE - COMPLETE 4+ VIEW

[l-spine ap]
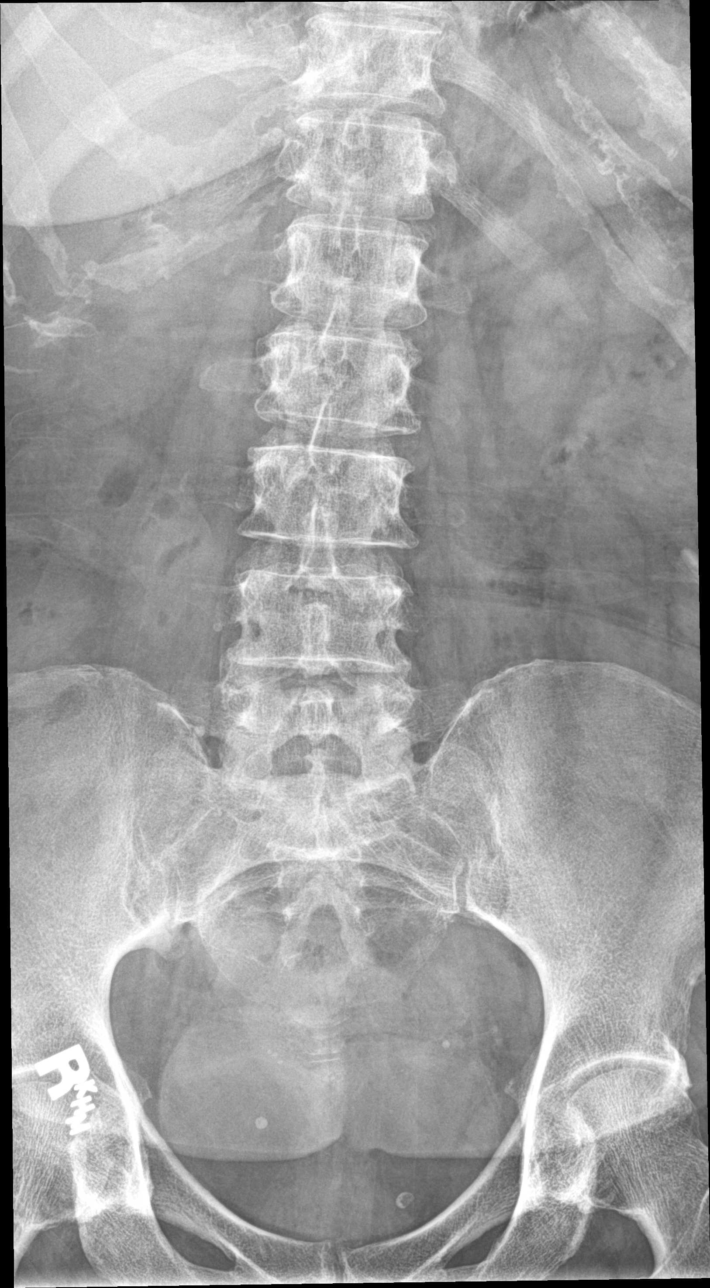

[l-spine obl (1 of 2)]
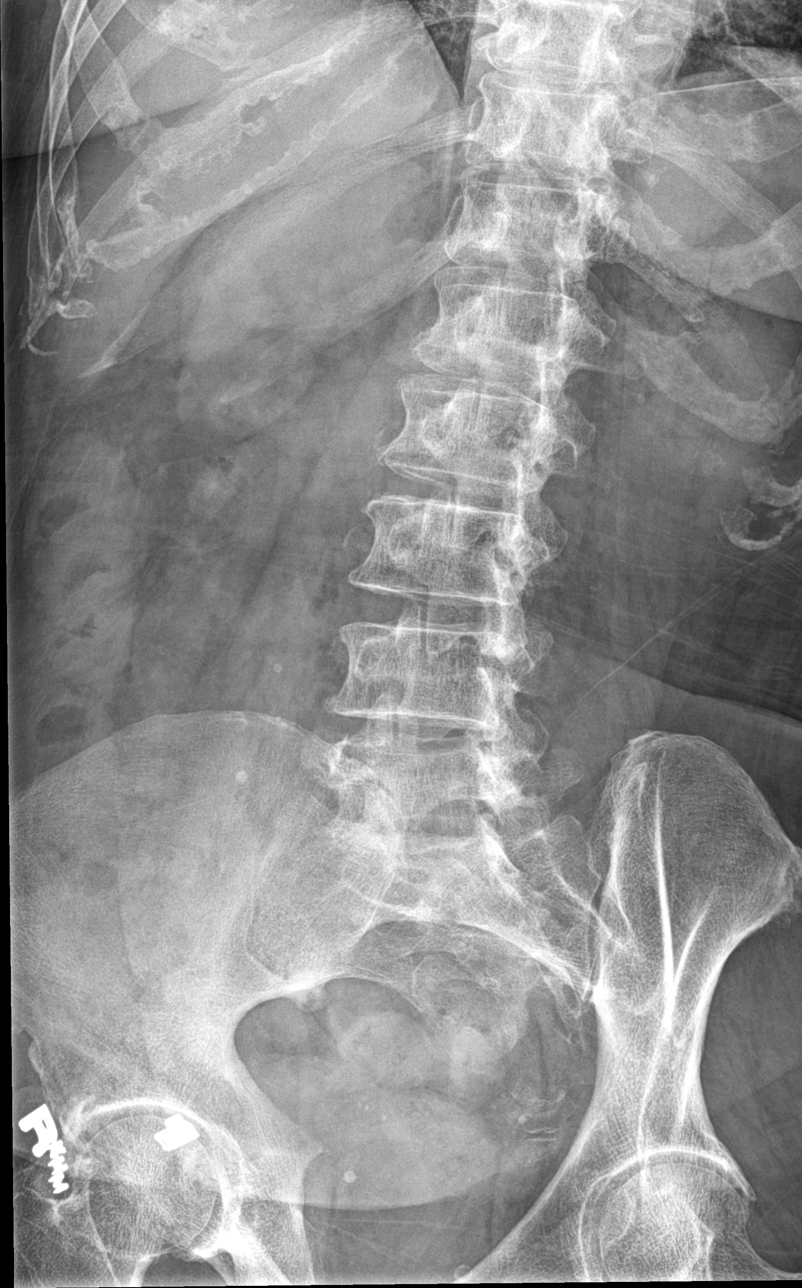

[l-spine lat]
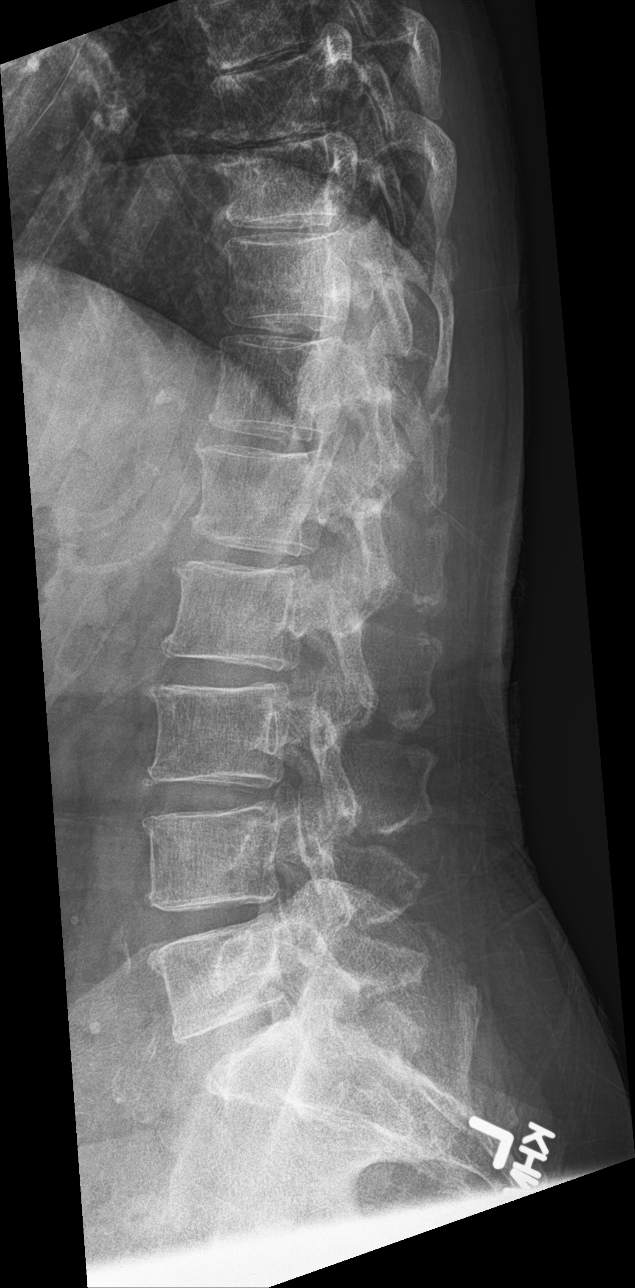

[l-spine spot]
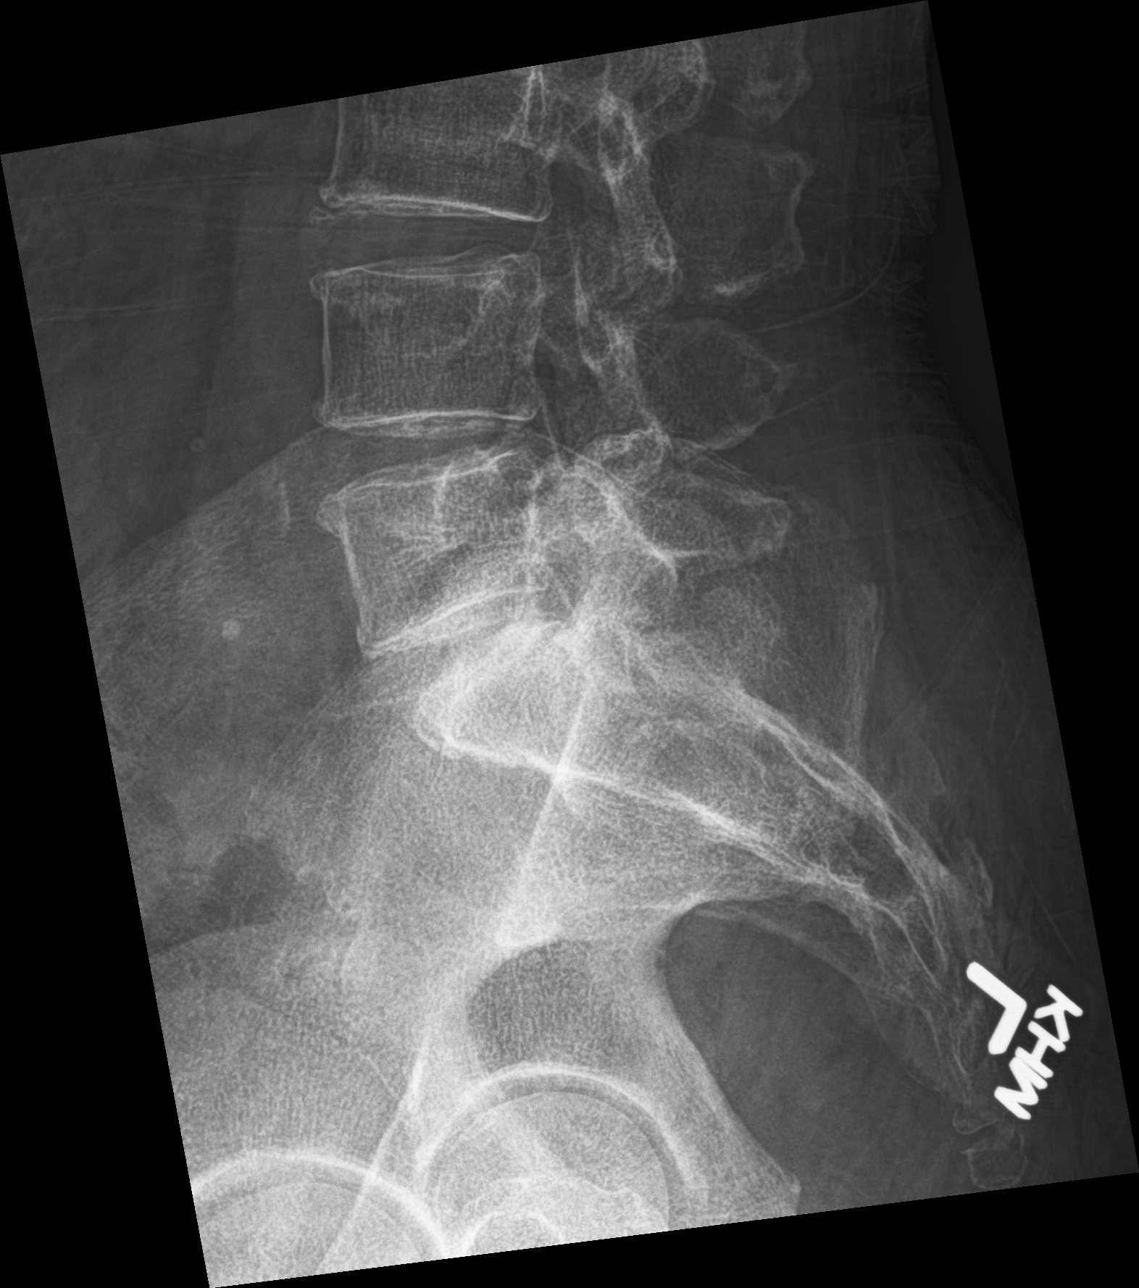

[l-spine obl (2 of 2)]
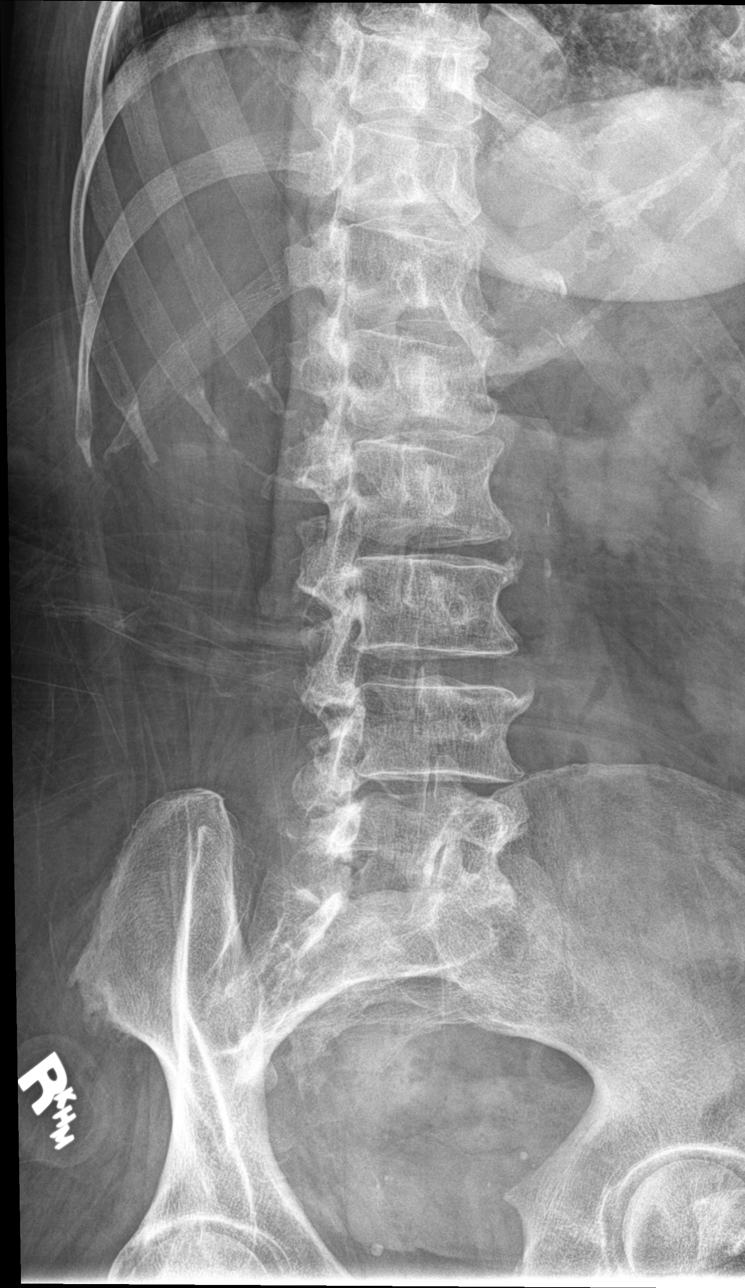

[5 of 5 positions shown; findings below may reference images not displayed]

FINDINGS: Five lumbar type vertebral bodies.

Normal lumbar lordosis.

No evidence of fracture or dislocation. Vertebral body heights and
intervertebral disc spaces are maintained.

Mild multilevel degenerative changes.

Visualized bony pelvis appears intact.
IMPRESSION: No fracture or dislocation is seen.

Mild degenerative changes.

## 2017-08-22 ENCOUNTER — Encounter: Payer: Self-pay | Admitting: *Deleted

## 2017-08-22 ENCOUNTER — Ambulatory Visit: Payer: Medicare Other | Admitting: Cardiovascular Disease

## 2017-08-22 ENCOUNTER — Encounter: Payer: Self-pay | Admitting: Cardiovascular Disease

## 2017-08-22 VITALS — BP 144/80 | HR 73 | Ht 66.0 in | Wt 126.4 lb

## 2017-08-22 DIAGNOSIS — F419 Anxiety disorder, unspecified: Secondary | ICD-10-CM | POA: Diagnosis not present

## 2017-08-22 DIAGNOSIS — R002 Palpitations: Secondary | ICD-10-CM | POA: Diagnosis not present

## 2017-08-22 DIAGNOSIS — I10 Essential (primary) hypertension: Secondary | ICD-10-CM | POA: Diagnosis not present

## 2017-08-22 NOTE — Progress Notes (Signed)
CARDIOLOGY CONSULT NOTE  Patient ID: Dawn Estrada MRN: [id number] DOB/AGE: Mar 19, 1919 84 y.o.  Admit date: (Not on file) Primary Physician: Kirstie Peri, MD Referring Physician: Dr. Sherryll Burger  Reason for Consultation: Palpitations  HPI: Dawn Estrada is a 84 y.o. female who is being seen today for the evaluation of palpitations at the request of Kirstie Peri, MD.   Past medical history includes hypertension and GERD.  I reviewed notes from her PCP.  There is mention of paroxysmal SVT.  There is also mention of a cardiac monitor being worn.  I reviewed an echocardiogram report dated 11/26/2014 which showed vigorous left ventricular systolic function and normal regional wall motion, LVEF 65 to 70%, mild LVH, and grade 1 diastolic dysfunction with high ventricular filling pressures.  She is here with her son, [name].  It appears she was hospitalized at [facility] overnight within the past several months.  She then wore a 2-week event monitor.  I do not have results of either hospitalization records nor the cardiac monitor.  She tells me she has a history of panic attacks for which she takes Xanax as needed.  She has occasional chest pains and occasional shortness of breath with talking.  She also has a history of hiatal hernia.  She said she has been bothered more by panic attacks and palpitations in the past 6 months.  She takes metoprolol 12.5 mg daily and has been on this dose for several years as per her son.  She denies leg swelling and syncope.   Allergies  Allergen Reactions  . Penicillins Swelling    Has patient had a PCN reaction causing immediate rash, facial/tongue/throat swelling, SOB or lightheadedness with hypotension: NO Has patient had a PCN reaction causing severe rash involving mucus membranes or skin necrosis: no Has patient had a PCN reaction that required hospitalization : no Has patient had a PCN reaction occurring within the last 10 years:  no If all of the above answers are "NO", then may proceed with Cephalosporin use.    Current Outpatient Medications  Medication Sig Dispense Refill  . ALPRAZolam (XANAX) 0.25 MG tablet Take 0.25 mg by mouth 2 (two) times daily as needed for anxiety.     . Cholecalciferol (VITAMIN D3) 400 units CAPS Take 1 capsule by mouth daily.  7  . fexofenadine (ALLEGRA) 180 MG tablet Take 180 mg by mouth daily.    . hydrochlorothiazide (MICROZIDE) 12.5 MG capsule Take 12.5 mg by mouth daily.    . metoprolol tartrate (LOPRESSOR) 25 MG tablet Take 12.5 mg by mouth daily.  1  . Omega-3 Fatty Acids (FISH OIL) 1000 MG CAPS Take 1 capsule by mouth daily.    . potassium chloride SA (K-DUR,KLOR-CON) 20 MEQ tablet Take 0.5 tablets by mouth daily.     . ranitidine (ZANTAC) 150 MG tablet Take 150 mg by mouth 2 (two) times daily.     No current facility-administered medications for this visit.     Past Medical History:  Diagnosis Date  . Anxiety   . Arthritis   . Hiatal hernia   . Hypertension   . Reflux     Past Surgical History:  Procedure Laterality Date  . ABDOMINAL HYSTERECTOMY    . ABDOMINAL SURGERY     resection  . APPENDECTOMY    . HEMORROIDECTOMY    . TONSILLECTOMY      Social History   Socioeconomic History  . Marital status: Widowed    Spouse  name: Not on file  . Number of children: Not on file  . Years of education: Not on file  . Highest education level: Not on file  Occupational History  . Not on file  Social Needs  . Financial resource strain: Not on file  . Food insecurity:    Worry: Not on file    Inability: Not on file  . Transportation needs:    Medical: Not on file    Non-medical: Not on file  Tobacco Use  . Smoking status: Never Smoker  . Smokeless tobacco: Never Used  Substance and Sexual Activity  . Alcohol use: No  . Drug use: Not on file  . Sexual activity: Not on file  Lifestyle  . Physical activity:    Days per week: Not on file    Minutes per  session: Not on file  . Stress: Not on file  Relationships  . Social connections:    Talks on phone: Not on file    Gets together: Not on file    Attends religious service: Not on file    Active member of club or organization: Not on file    Attends meetings of clubs or organizations: Not on file    Relationship status: Not on file  . Intimate partner violence:    Fear of current or ex partner: Not on file    Emotionally abused: Not on file    Physically abused: Not on file    Forced sexual activity: Not on file  Other Topics Concern  . Not on file  Social History Narrative  . Not on file     No family history of premature CAD in 1st degree relatives.  Current Meds  Medication Sig  . ALPRAZolam (XANAX) 0.25 MG tablet Take 0.25 mg by mouth 2 (two) times daily as needed for anxiety.   . Cholecalciferol (VITAMIN D3) 400 units CAPS Take 1 capsule by mouth daily.  . fexofenadine (ALLEGRA) 180 MG tablet Take 180 mg by mouth daily.  . hydrochlorothiazide (MICROZIDE) 12.5 MG capsule Take 12.5 mg by mouth daily.  . metoprolol tartrate (LOPRESSOR) 25 MG tablet Take 12.5 mg by mouth daily.  . Omega-3 Fatty Acids (FISH OIL) 1000 MG CAPS Take 1 capsule by mouth daily.  . potassium chloride SA (K-DUR,KLOR-CON) 20 MEQ tablet Take 0.5 tablets by mouth daily.   . ranitidine (ZANTAC) 150 MG tablet Take 150 mg by mouth 2 (two) times daily.      Review of systems complete and found to be negative unless listed above in HPI    Physical exam Blood pressure 130/75, pulse 74, height 5\' 6"  (1.676 m), weight 126 lb 6.4 oz (57.3 kg), SpO2 99 %. General: NAD Neck: No JVD, no thyromegaly or thyroid nodule.  Lungs: Clear to auscultation bilaterally with normal respiratory effort. CV: Nondisplaced PMI. Regular rate and rhythm, normal S1/S2, no S3/S4, no murmur.  No peripheral edema.  No carotid bruit.   Abdomen: Soft, nontender, no distention.  Skin: Intact without lesions or rashes.  Neurologic:  Alert and oriented x 3.  Psych: Normal affect. Extremities: No clubbing or cyanosis.  HEENT: Normal.   ECG: Most recent ECG reviewed.   Labs: Lab Results  Component Value Date/Time   K 3.3 (L) 07/05/2015 02:40 PM   BUN 13 07/05/2015 02:40 PM   CREATININE 0.97 07/05/2015 02:40 PM   ALT 14 07/05/2015 02:40 PM   TSH 1.521 11/25/2014 10:35 AM   HGB 12.3 07/05/2015 02:40 PM  Lipids: No results found for: LDLCALC, LDLDIRECT, CHOL, TRIG, HDL      ASSESSMENT AND PLAN:  1.  Palpitations: She reportedly has a history of paroxysmal SVT.  I will try to obtain [facility] as well as 2-week event monitor results from her PCP.  She is currently on metoprolol tartrate 12.5 mg daily.  I will not make any adjustments until I am able to review all results.  2.  Hypertension: Blood pressure is reasonable for age.  No changes to therapy.  3.  Anxiety with possible panic attacks: She currently takes Xanax as needed.  She was reportedly offered Lexapro by her PCP but she was afraid to take it.   Disposition: Follow up in 3 months    Signed: Prentice DockerSuresh Chevonne Bostrom, M.D., F.A.C.C.  08/22/2017, 1:47 PM

## 2017-08-22 NOTE — Patient Instructions (Signed)
Your physician recommends that you schedule a follow-up appointment in: 3 MONTHS WITH DR KONESWARAN  Your physician recommends that you continue on your current medications as directed. Please refer to the Current Medication list given to you today.  Thank you for choosing [facility]!!   

## 2017-08-26 ENCOUNTER — Other Ambulatory Visit: Payer: Self-pay

## 2017-08-26 ENCOUNTER — Encounter (HOSPITAL_COMMUNITY): Payer: Self-pay | Admitting: Emergency Medicine

## 2017-08-26 ENCOUNTER — Emergency Department (HOSPITAL_COMMUNITY)
Admission: EM | Admit: 2017-08-26 | Discharge: 2017-08-26 | Disposition: A | Discharge status: Home / Self Care | Payer: Medicare Other | Attending: Emergency Medicine | Admitting: Emergency Medicine

## 2017-08-26 DIAGNOSIS — R112 Nausea with vomiting, unspecified: Secondary | ICD-10-CM

## 2017-08-26 DIAGNOSIS — Z79899 Other long term (current) drug therapy: Secondary | ICD-10-CM | POA: Insufficient documentation

## 2017-08-26 DIAGNOSIS — Z7982 Long term (current) use of aspirin: Secondary | ICD-10-CM | POA: Diagnosis not present

## 2017-08-26 DIAGNOSIS — T887XXA Unspecified adverse effect of drug or medicament, initial encounter: Secondary | ICD-10-CM

## 2017-08-26 DIAGNOSIS — I10 Essential (primary) hypertension: Secondary | ICD-10-CM | POA: Diagnosis not present

## 2017-08-26 DIAGNOSIS — R109 Unspecified abdominal pain: Secondary | ICD-10-CM | POA: Insufficient documentation

## 2017-08-26 DIAGNOSIS — T43595A Adverse effect of other antipsychotics and neuroleptics, initial encounter: Secondary | ICD-10-CM | POA: Insufficient documentation

## 2017-08-26 DIAGNOSIS — R030 Elevated blood-pressure reading, without diagnosis of hypertension: Secondary | ICD-10-CM | POA: Diagnosis present

## 2017-08-26 LAB — URINALYSIS, ROUTINE W REFLEX MICROSCOPIC
BACTERIA UA: NONE SEEN
BILIRUBIN URINE: NEGATIVE
Glucose, UA: NEGATIVE mg/dL
Hgb urine dipstick: NEGATIVE
KETONES UR: NEGATIVE mg/dL
Nitrite: NEGATIVE
PH: 6 (ref 5.0–8.0)
Protein, ur: NEGATIVE mg/dL
SPECIFIC GRAVITY, URINE: 1.013 (ref 1.005–1.030)

## 2017-08-26 LAB — I-STAT CHEM 8, ED
BUN: 9 mg/dL (ref 8–23)
CREATININE: 0.8 mg/dL (ref 0.44–1.00)
Calcium, Ion: 1.1 mmol/L — ABNORMAL LOW (ref 1.15–1.40)
Chloride: 102 mmol/L (ref 98–111)
Glucose, Bld: 107 mg/dL — ABNORMAL HIGH (ref 70–99)
HEMATOCRIT: 38 % (ref 36.0–46.0)
HEMOGLOBIN: 12.9 g/dL (ref 12.0–15.0)
POTASSIUM: 3.6 mmol/L (ref 3.5–5.1)
SODIUM: 137 mmol/L (ref 135–145)
TCO2: 23 mmol/L (ref 22–32)

## 2017-08-26 LAB — CBC WITH DIFFERENTIAL/PLATELET
Basophils Absolute: 0 10*3/uL (ref 0.0–0.1)
Basophils Relative: 0 %
Eosinophils Absolute: 0 10*3/uL (ref 0.0–0.7)
Eosinophils Relative: 0 %
HEMATOCRIT: 38.6 % (ref 36.0–46.0)
Hemoglobin: 12.9 g/dL (ref 12.0–15.0)
LYMPHS PCT: 26 %
Lymphs Abs: 2 10*3/uL (ref 0.7–4.0)
MCH: 29.9 pg (ref 26.0–34.0)
MCHC: 33.4 g/dL (ref 30.0–36.0)
MCV: 89.6 fL (ref 78.0–100.0)
MONO ABS: 0.5 10*3/uL (ref 0.1–1.0)
Monocytes Relative: 7 %
NEUTROS ABS: 5.1 10*3/uL (ref 1.7–7.7)
NEUTROS PCT: 67 %
Platelets: 234 10*3/uL (ref 150–400)
RBC: 4.31 MIL/uL (ref 3.87–5.11)
RDW: 13.2 % (ref 11.5–15.5)
WBC: 7.6 10*3/uL (ref 4.0–10.5)

## 2017-08-26 MED ORDER — ONDANSETRON 4 MG PO TBDP: 4.0000 mg | ORAL_TABLET | Freq: Three times a day (TID) | ORAL | 0 refills | Status: DC | PRN

## 2017-08-26 NOTE — ED Triage Notes (Addendum)
Patient c/o hypertension. Per family, patient seen by PCP (Dr Sherryll BurgerShah) on Friday and placed on new medication (lexapro) for anxiety. Family states that since she started medication she has been sick with nausea and vomiting and her blood pressure has been elevated. Per patient blood pressure 160/80 at home. Patient also reports intermittent right flank pain "for a while." Denies any urinary symptoms.

## 2017-08-26 NOTE — ED Provider Notes (Signed)
[facility] EMERGENCY DEPARTMENT Provider Note   CSN: [id number] Arrival date & time: 08/26/17  1401     History   Chief Complaint Chief Complaint  Patient presents with  . Hypertension    HPI Dawn Estrada is a 84 y.o. female.  HPI 84 year old female comes in with chief complaint of elevated blood pressure. According to patient's son, patient was recently stopped on Xanax and started on Seroquel by her PCP.  Since then patient has been having some panic attacks and also noted to have elevated blood pressure.  Additionally, patient has been having nausea, vomiting and pain in her right lower quadrant and flank region.  The pain in her right side of the abdomen has been going on for several days, there is no associated burning with urination, blood in the urine, vaginal discharge or bleeding.  Past Medical History:  Diagnosis Date  . Anxiety   . Arthritis   . Hiatal hernia   . Hypertension   . Reflux     Patient Active Problem List   Diagnosis Date Noted  . Hypertension   . CAP (community acquired pneumonia) 11/25/2014  . Generalized weakness 11/25/2014  . Hypokalemia 11/25/2014  . Elevated troponin 11/25/2014  . Chest pain 11/25/2014  . Leukocytosis 11/25/2014  . Gastroenteritis 11/25/2014  . Sinus tachycardia 11/25/2014    Past Surgical History:  Procedure Laterality Date  . ABDOMINAL HYSTERECTOMY    . ABDOMINAL SURGERY     resection  . APPENDECTOMY    . HEMORROIDECTOMY    . TONSILLECTOMY       OB History    Gravida  3   Para  3   Term  3   Preterm      AB      Living        SAB      TAB      Ectopic      Multiple      Live Births               Home Medications    Prior to Admission medications   Medication Sig Start Date End Date Taking? Authorizing Provider  ALPRAZolam (XANAX) 0.25 MG tablet Take 0.25 mg by mouth 2 (two) times daily as needed for anxiety.    Yes [provider]  aspirin EC 81 MG tablet Take 81  mg by mouth daily as needed. Rarely takes   Yes [provider]  Cholecalciferol (VITAMIN D3) 400 units CAPS Take 1 capsule by mouth daily. 06/22/17  Yes [provider]  hydrochlorothiazide (MICROZIDE) 12.5 MG capsule Take 12.5 mg by mouth daily.   Yes [provider]  metoprolol tartrate (LOPRESSOR) 25 MG tablet Take 12.5 mg by mouth daily. 07/24/17  Yes [provider]  potassium chloride SA (K-DUR,KLOR-CON) 20 MEQ tablet Take 0.5 tablets by mouth daily.  11/19/14  Yes [provider]  ranitidine (ZANTAC) 150 MG tablet Take 150 mg by mouth 2 (two) times daily.   Yes [provider]  ondansetron (ZOFRAN ODT) 4 MG disintegrating tablet Take 1 tablet (4 mg total) by mouth every 8 (eight) hours as needed for nausea or vomiting. 08/26/17   Derwood Kaplan, MD    Family History Family History  Problem Relation Age of Onset  . Arthritis Mother   . Myasthenia gravis Sister   . Cancer Brother   . Hypertension Sister   . Diabetes Sister     Social History Social History   Tobacco  Use  . Smoking status: Never Smoker  . Smokeless tobacco: Never Used  Substance Use Topics  . Alcohol use: No  . Drug use: Never     Allergies   Penicillins   Review of Systems Review of Systems  Constitutional: Positive for activity change.  Respiratory: Negative for shortness of breath.   Cardiovascular: Negative for chest pain.  Gastrointestinal: Positive for abdominal pain, nausea and vomiting.  Genitourinary: Positive for flank pain. Negative for dysuria.  Psychiatric/Behavioral: The patient is nervous/anxious.      Physical Exam Updated Vital Signs BP (!) 163/77   Pulse 65   Temp 98.6 F (37 C) (Oral)   Resp 17   Ht 5\' 6"  (1.676 m)   Wt 57.2 kg (126 lb)   SpO2 100%   BMI 20.34 kg/m   Physical Exam  Constitutional: She is oriented to person, place, and time. She appears well-developed.  HENT:  Head: Normocephalic and atraumatic.    Eyes: EOM are normal.  Neck: Normal range of motion. Neck supple.  Cardiovascular: Normal rate.  Pulmonary/Chest: Effort normal.  Abdominal: Bowel sounds are normal. She exhibits no mass. There is tenderness. There is no rebound and no guarding.  Mild tenderness on the right lower quadrant, without any rebound or guarding.  There is also mild tenderness over the suprapubic region.  Neurological: She is alert and oriented to person, place, and time.  Skin: Skin is warm and dry.  Nursing note and vitals reviewed.    ED Treatments / Results  Labs (all labs ordered are listed, but only abnormal results are displayed) Labs Reviewed  URINALYSIS, ROUTINE W REFLEX MICROSCOPIC - Abnormal; Notable for the following components:      Result Value   Leukocytes, UA SMALL (*)    All other components within normal limits  I-STAT CHEM 8, ED - Abnormal; Notable for the following components:   Glucose, Bld 107 (*)    Calcium, Ion 1.10 (*)    All other components within normal limits  URINE CULTURE  CBC WITH DIFFERENTIAL/PLATELET    EKG EKG Interpretation  Date/Time:  Sunday August 26 2017 15:48:48 EDT Ventricular Rate:  68 PR Interval:    QRS Duration: 77 QT Interval:  427 QTC Calculation: 455 R Axis:   -17 Text Interpretation:  Sinus rhythm Borderline left axis deviation No acute changes Confirmed by Derwood Kaplan [phone]) on 08/26/2017 4:26:58 PM   Radiology No results found.  Procedures Procedures (including critical care time)  Medications Ordered in ED Medications - No data to display   Initial Impression / Assessment and Plan / ED Course  I have reviewed the triage vital signs and the nursing notes.  Pertinent labs & imaging results that were available during my care of the patient were reviewed by me and considered in my medical decision making (see chart for details).     Patient comes in with chief complaint of elevated blood pressure, anxiety, nausea, vomiting and  abdominal discomfort.  Patient's abdominal discomfort has been going on for several days, while the other symptoms started after she was stopped on Xanax and started on Seroquel.  Patient has nonfocal abdominal exam.  No constitutional that are concerning for UTI or intra-abdominal infection or surgical issues.  UA has been ordered. I suspect that some of the patient's symptoms, such as nausea, anxiety, elevated blood pressure could be because of discontinuation of Zoloft or because of Seroquel itself.  The medication change was made just 2 days ago, it is  too early for us to make any recommendations from the emergency side.  There has been no severe reaction to stoppage of Xanax, such as seizures or hallucinations or perceptual problems.  Plan is to manage the symptoms for now She has been advised to follow-up with PCP again in 1 week. Strict ER return precautions have been discussed, and patient is agreeing with the plan and is comfortable with the workup done and the recommendations from the ER.  Final Clinical Impressions(s) / ED Diagnoses   Final diagnoses:  Medication side effects  Abdominal discomfort  Non-intractable vomiting with nausea, unspecified vomiting type    ED Discharge Orders        Ordered    ondansetron (ZOFRAN ODT) 4 MG disintegrating tablet  Every 8 hours PRN     08/26/17 1904       Derwood KaplanNanavati, Tavie Haseman, MD 08/26/17 1906

## 2017-08-26 NOTE — Discharge Instructions (Addendum)
All the ER results are normal. There is no evidence of kidney infection and all the electrolytes are fine as well.  We suspect that some of your symptoms are because of changes in medications. I recommend that you continue Seroquel and set up an appointment with your primary care doctor next week.  If you continue to feel unwell, then your primary care doctor can either restart you on Xanax or help you wean off of Xanax.  Please return to the ER if your symptoms worsen; you have increased pain, fevers, chills, inability to keep any medications down, confusion, seizures. Otherwise see the outpatient doctor as requested.

## 2017-08-28 LAB — URINE CULTURE: Culture: <10000 — AB

## 2017-11-22 ENCOUNTER — Ambulatory Visit (INDEPENDENT_AMBULATORY_CARE_PROVIDER_SITE_OTHER): Payer: Medicare Other | Admitting: Cardiovascular Disease

## 2017-11-22 ENCOUNTER — Encounter: Payer: Self-pay | Admitting: Cardiovascular Disease

## 2017-11-22 VITALS — BP 148/68 | HR 80 | Ht 63.0 in | Wt 128.0 lb

## 2017-11-22 DIAGNOSIS — R002 Palpitations: Secondary | ICD-10-CM

## 2017-11-22 DIAGNOSIS — F419 Anxiety disorder, unspecified: Secondary | ICD-10-CM

## 2017-11-22 DIAGNOSIS — I10 Essential (primary) hypertension: Secondary | ICD-10-CM

## 2017-11-22 NOTE — Patient Instructions (Signed)

## 2017-11-22 NOTE — Progress Notes (Signed)
SUBJECTIVE: The patient presents for routine follow-up.  She was evaluated in the ED on 08/26/2017 for hypertension.  Blood pressure was 163/77 at that time.  I initially evaluated her for palpitations on 08/22/2017.  I reviewed the event monitor which showed sinus rhythm with PACs and PVCs and PSVT, the longest lasting 8 beats.  There was comments that said the SVT may have been atrial tachycardia with variable block.  She also has a history of panic attacks.  She takes Xanax as needed for this.  She tells me her palpitations have significantly improved.  She is not having as much anxiety or panic attacks.  She denies exertional chest pain and shortness of breath.  She does have a history of hiatal hernia.  She is here with her son, [name].  Review of Systems: As per "subjective", otherwise negative.  Allergies  Allergen Reactions  . Penicillins Swelling    Has patient had a PCN reaction causing immediate rash, facial/tongue/throat swelling, SOB or lightheadedness with hypotension: NO Has patient had a PCN reaction causing severe rash involving mucus membranes or skin necrosis: no Has patient had a PCN reaction that required hospitalization : no Has patient had a PCN reaction occurring within the last 10 years: no If all of the above answers are "NO", then may proceed with Cephalosporin use.    Current Outpatient Medications  Medication Sig Dispense Refill  . ALPRAZolam (XANAX) 0.25 MG tablet Take 0.25 mg by mouth 2 (two) times daily as needed for anxiety.     Marland Kitchen aspirin EC 81 MG tablet Take 81 mg by mouth daily as needed. Rarely takes    . Cholecalciferol (VITAMIN D3) 400 units CAPS Take 1 capsule by mouth daily.  7  . famotidine (PEPCID) 20 MG tablet Take 20 mg by mouth 2 (two) times daily.    . hydrochlorothiazide (MICROZIDE) 12.5 MG capsule Take 12.5 mg by mouth daily.    . metoprolol tartrate (LOPRESSOR) 25 MG tablet Take 12.5 mg by mouth daily.  1  . ondansetron  (ZOFRAN ODT) 4 MG disintegrating tablet Take 1 tablet (4 mg total) by mouth every 8 (eight) hours as needed for nausea or vomiting. 20 tablet 0  . potassium chloride SA (K-DUR,KLOR-CON) 20 MEQ tablet Take 0.5 tablets by mouth daily.      No current facility-administered medications for this visit.     Past Medical History:  Diagnosis Date  . Anxiety   . Arthritis   . Hiatal hernia   . Hypertension   . Reflux     Past Surgical History:  Procedure Laterality Date  . ABDOMINAL HYSTERECTOMY    . ABDOMINAL SURGERY     resection  . APPENDECTOMY    . HEMORROIDECTOMY    . TONSILLECTOMY      Social History   Socioeconomic History  . Marital status: Widowed    Spouse name: Not on file  . Number of children: Not on file  . Years of education: Not on file  . Highest education level: Not on file  Occupational History  . Not on file  Social Needs  . Financial resource strain: Not on file  . Food insecurity:    Worry: Not on file    Inability: Not on file  . Transportation needs:    Medical: Not on file    Non-medical: Not on file  Tobacco Use  . Smoking status: Never Smoker  . Smokeless tobacco: Never Used  Substance and Sexual Activity  .  Alcohol use: No  . Drug use: Never  . Sexual activity: Not on file  Lifestyle  . Physical activity:    Days per week: Not on file    Minutes per session: Not on file  . Stress: Not on file  Relationships  . Social connections:    Talks on phone: Not on file    Gets together: Not on file    Attends religious service: Not on file    Active member of club or organization: Not on file    Attends meetings of clubs or organizations: Not on file    Relationship status: Not on file  . Intimate partner violence:    Fear of current or ex partner: Not on file    Emotionally abused: Not on file    Physically abused: Not on file    Forced sexual activity: Not on file  Other Topics Concern  . Not on file  Social History Narrative  . Not  on file     Vitals:   11/22/17 1252  BP: (!) 148/68  Pulse: 80  SpO2: 98%  Weight: 128 lb (58.1 kg)  Height: 5\' 3"  (1.6 m)    Wt Readings from Last 3 Encounters:  11/22/17 128 lb (58.1 kg)  08/26/17 126 lb (57.2 kg)  08/22/17 126 lb 6.4 oz (57.3 kg)     PHYSICAL EXAM General: NAD HEENT: Normal. Neck: No JVD, no thyromegaly. Lungs: Clear to auscultation bilaterally with normal respiratory effort. CV: Regular rate and rhythm, normal S1/S2, no S3/S4, no murmur. No pretibial or periankle edema.   Abdomen: Soft, nontender, no distention.  Neurologic: Alert and oriented.  Psych: Normal affect. Skin: Normal. Musculoskeletal: No gross deformities.    ECG: Reviewed above under Subjective   Labs: Lab Results  Component Value Date/Time   K 3.6 08/26/2017 06:43 PM   BUN 9 08/26/2017 06:43 PM   CREATININE 0.80 08/26/2017 06:43 PM   ALT 14 07/05/2015 02:40 PM   TSH 1.521 11/25/2014 10:35 AM   HGB 12.9 08/26/2017 06:43 PM     Lipids: No results found for: LDLCALC, LDLDIRECT, CHOL, TRIG, HDL     ASSESSMENT AND PLAN:  1.  Palpitations: Symptomatically stable.  She has a history of paroxysmal SVT.  Event monitor reviewed above. She is currently on metoprolol tartrate 12.5 mg daily.    If she were to have increased symptoms in the future, I would increase Lopressor to 12.5 mg twice daily.  2.  Hypertension: Blood pressure is reasonable for age.    No changes to therapy.  3.  Anxiety with possible panic attacks: She currently takes Xanax as needed.  Symptoms have improved.   Disposition: Follow up 6 months   Prentice Docker, M.D., F.A.C.C.

## 2018-05-22 ENCOUNTER — Ambulatory Visit: Payer: Medicare Other | Admitting: Cardiovascular Disease

## 2018-07-31 ENCOUNTER — Telehealth: Payer: Self-pay | Admitting: *Deleted

## 2018-07-31 NOTE — Telephone Encounter (Signed)
Patient verbally consented for tele-health visits with [facility] and understands that her insurance company will be billed for the encounter.  Aware to have vitals available   

## 2018-08-14 ENCOUNTER — Telehealth (INDEPENDENT_AMBULATORY_CARE_PROVIDER_SITE_OTHER): Payer: Medicare Other | Admitting: Cardiovascular Disease

## 2018-08-14 ENCOUNTER — Encounter: Payer: Self-pay | Admitting: Cardiovascular Disease

## 2018-08-14 VITALS — BP 132/73 | HR 77 | Ht 65.0 in | Wt 130.0 lb

## 2018-08-14 DIAGNOSIS — R002 Palpitations: Secondary | ICD-10-CM

## 2018-08-14 DIAGNOSIS — F419 Anxiety disorder, unspecified: Secondary | ICD-10-CM | POA: Diagnosis not present

## 2018-08-14 DIAGNOSIS — I10 Essential (primary) hypertension: Secondary | ICD-10-CM

## 2018-08-14 NOTE — Patient Instructions (Signed)

## 2018-08-14 NOTE — Progress Notes (Signed)
Virtual Visit via Telephone Note   This visit type was conducted due to national recommendations for restrictions regarding the COVID-19 Pandemic (e.g. social distancing) in an effort to limit this patient's exposure and mitigate transmission in our community.  Due to her co-morbid illnesses, this patient is at least at moderate risk for complications without adequate follow up.  This format is felt to be most appropriate for this patient at this time.  The patient did not have access to video technology/had technical difficulties with video requiring transitioning to audio format only (telephone).  All issues noted in this document were discussed and addressed.  No physical exam could be performed with this format.  Please refer to the patient's chart for her  consent to telehealth for [facility].   Date:  08/14/2018   ID:  Dawn Estrada, DOB [birth year], MRN [id number]  Patient Location: Home Provider Location: Office  PCP:  Monico Blitz, MD  Cardiologist:  Kate Sable, MD  Electrophysiologist:  None   Evaluation Performed:  Follow-Up Visit  Chief Complaint:  Palpitations  History of Present Illness:    Dawn Estrada is a 85 y.o. female with history of palpitations and panic attacks.  Prior event monitoring demonstrated sinus rhythm with PACs and PVCs and PSVT, the longest lasting 8 beats.  There was comments that said the SVT may have been atrial tachycardia with variable block.  She seldom has chest pains. She denies palpitations. She denies dizziness and syncope. She denies any current anxiety or recent panic attacks.  Her appetite has been good.  She does puzzle books and reads the Bible.  The patient does not have symptoms concerning for COVID-19 infection (fever, chills, cough, or new shortness of breath).    Past Medical History:  Diagnosis Date  . Anxiety   . Arthritis   . Hiatal hernia   . Hypertension   . Reflux    Past Surgical History:   Procedure Laterality Date  . ABDOMINAL HYSTERECTOMY    . ABDOMINAL SURGERY     resection  . APPENDECTOMY    . HEMORROIDECTOMY    . TONSILLECTOMY       Current Meds  Medication Sig  . ALPRAZolam (XANAX) 0.25 MG tablet Take 0.25 mg by mouth 2 (two) times daily as needed for anxiety.   Marland Kitchen aspirin EC 81 MG tablet Take 81 mg by mouth daily as needed. Rarely takes  . Cholecalciferol (VITAMIN D3) 400 units CAPS Take 1 capsule by mouth daily.  . famotidine (PEPCID) 20 MG tablet Take 20 mg by mouth 2 (two) times daily.  . hydrochlorothiazide (MICROZIDE) 12.5 MG capsule Take 12.5 mg by mouth daily.  . metoprolol tartrate (LOPRESSOR) 25 MG tablet Take 12.5 mg by mouth 2 (two) times daily.   . Multiple Vitamin (MULTIVITAMIN) tablet Take 1 tablet by mouth daily.  . ondansetron (ZOFRAN ODT) 4 MG disintegrating tablet Take 1 tablet (4 mg total) by mouth every 8 (eight) hours as needed for nausea or vomiting.  . potassium chloride SA (K-DUR,KLOR-CON) 20 MEQ tablet Take 0.5 tablets by mouth daily.      Allergies:   Penicillins   Social History   Tobacco Use  . Smoking status: Never Smoker  . Smokeless tobacco: Never Used  Substance Use Topics  . Alcohol use: No  . Drug use: Never     Family Hx: The patient's family history includes Arthritis in her mother; Cancer in her brother; Diabetes in her sister; Hypertension in her sister;  Myasthenia gravis in her sister.  ROS:   Please see the history of present illness.     All other systems reviewed and are negative.   Prior CV studies:   The following studies were reviewed today:  See above  Labs/Other Tests and Data Reviewed:    EKG:  No ECG reviewed.  Recent Labs: 08/26/2017: BUN 9; Creatinine, Ser 0.80; Hemoglobin 12.9; Platelets 234; Potassium 3.6; Sodium 137   Recent Lipid Panel No results found for: CHOL, TRIG, HDL, CHOLHDL, LDLCALC, LDLDIRECT  Wt Readings from Last 3 Encounters:  08/14/18 130 lb (59 kg)  11/22/17 128 lb  (58.1 kg)  08/26/17 126 lb (57.2 kg)     Objective:    Vital Signs:  BP 132/73   Pulse 77   Ht 5\' 5"  (1.651 m)   Wt 130 lb (59 kg)   BMI 21.63 kg/m    VITAL SIGNS:  reviewed  ASSESSMENT & PLAN:    1. Palpitations: Symptomatically stable.  She has a history of paroxysmal SVT.  Event monitor reviewed above.She is on metoprolol tartrate 12.5 mg twice daily.   2. Hypertension: Blood pressure is normal.   No changes to therapy.  3. Anxiety with possible panic attacks: She currently takes Xanax as needed.  Symptomatically stable.    COVID-19 Education: The signs and symptoms of COVID-19 were discussed with the patient and how to seek care for testing (follow up with PCP or arrange E-visit).  The importance of social distancing was discussed today.  Time:   Today, I have spent 10 minutes with the patient with telehealth technology discussing the above problems.     Medication Adjustments/Labs and Tests Ordered: Current medicines are reviewed at length with the patient today.  Concerns regarding medicines are outlined above.   Tests Ordered: No orders of the defined types were placed in this encounter.   Medication Changes: No orders of the defined types were placed in this encounter.   Follow Up:  Virtual Visit in 6 month(s)  Signed, Prentice DockerSuresh Drago Hammonds, MD  08/14/2018 2:49 PM    [facility]

## 2019-03-20 DIAGNOSIS — Z6821 Body mass index (BMI) 21.0-21.9, adult: Secondary | ICD-10-CM | POA: Diagnosis not present

## 2019-03-20 DIAGNOSIS — I10 Essential (primary) hypertension: Secondary | ICD-10-CM | POA: Diagnosis not present

## 2019-03-20 DIAGNOSIS — Z299 Encounter for prophylactic measures, unspecified: Secondary | ICD-10-CM | POA: Diagnosis not present

## 2019-03-20 DIAGNOSIS — M1990 Unspecified osteoarthritis, unspecified site: Secondary | ICD-10-CM | POA: Diagnosis not present

## 2019-03-20 DIAGNOSIS — Z789 Other specified health status: Secondary | ICD-10-CM | POA: Diagnosis not present

## 2019-03-20 DIAGNOSIS — I471 Supraventricular tachycardia: Secondary | ICD-10-CM | POA: Diagnosis not present

## 2019-04-07 DIAGNOSIS — M79672 Pain in left foot: Secondary | ICD-10-CM | POA: Diagnosis not present

## 2019-04-07 DIAGNOSIS — L110 Acquired keratosis follicularis: Secondary | ICD-10-CM | POA: Diagnosis not present

## 2019-04-07 DIAGNOSIS — I739 Peripheral vascular disease, unspecified: Secondary | ICD-10-CM | POA: Diagnosis not present

## 2019-04-07 DIAGNOSIS — M79671 Pain in right foot: Secondary | ICD-10-CM | POA: Diagnosis not present

## 2019-04-16 DIAGNOSIS — H401123 Primary open-angle glaucoma, left eye, severe stage: Secondary | ICD-10-CM | POA: Diagnosis not present

## 2019-04-18 ENCOUNTER — Telehealth (INDEPENDENT_AMBULATORY_CARE_PROVIDER_SITE_OTHER): Payer: Medicare Other | Admitting: Cardiovascular Disease

## 2019-04-18 ENCOUNTER — Encounter: Payer: Self-pay | Admitting: Cardiovascular Disease

## 2019-04-18 VITALS — BP 132/68 | HR 71 | Ht 61.0 in | Wt 125.0 lb

## 2019-04-18 DIAGNOSIS — R079 Chest pain, unspecified: Secondary | ICD-10-CM

## 2019-04-18 DIAGNOSIS — F419 Anxiety disorder, unspecified: Secondary | ICD-10-CM

## 2019-04-18 DIAGNOSIS — R002 Palpitations: Secondary | ICD-10-CM | POA: Diagnosis not present

## 2019-04-18 DIAGNOSIS — I10 Essential (primary) hypertension: Secondary | ICD-10-CM

## 2019-04-18 NOTE — Progress Notes (Signed)
Virtual Visit via Telephone Note   This visit type was conducted due to national recommendations for restrictions regarding the COVID-19 Pandemic (e.g. social distancing) in an effort to limit this patient's exposure and mitigate transmission in our community.  Due to her co-morbid illnesses, this patient is at least at moderate risk for complications without adequate follow up.  This format is felt to be most appropriate for this patient at this time.  The patient did not have access to video technology/had technical difficulties with video requiring transitioning to audio format only (telephone).  All issues noted in this document were discussed and addressed.  No physical exam could be performed with this format.  Please refer to the patient's chart for her  consent to telehealth for [facility].   The patient was identified using 2 identifiers.  Date:  04/18/2019   ID:  Dawn Estrada, DOB 1919-11-07, MRN [id number]  Patient Location: Home Provider Location: Office  PCP:  Monico Blitz, MD  Cardiologist:  Kate Sable, MD  Electrophysiologist:  None   Evaluation Performed:  Follow-Up Visit  Chief Complaint:  Palpitations  History of Present Illness:    Dawn Estrada is a 86 y.o. female with a history of palpitations and panic attacks.  Prior event monitoring demonstrated sinus rhythm with PACs and PVCs and PSVT, the longest lasting 8 beats. There was a comment that said the SVT may have been atrial tachycardia with variable block.  She does puzzle books and reads the Bible.  She has occasional chest pains and occasional left arm pain. It occurs once or twice a week.   She takes something for gas and symptoms resolve.  She says her legs are chronically weak.  She denies palpitations.   Past Medical History:  Diagnosis Date  . Anxiety   . Arthritis   . Hiatal hernia   . Hypertension   . Reflux    Past Surgical History:  Procedure Laterality Date  .  ABDOMINAL HYSTERECTOMY    . ABDOMINAL SURGERY     resection  . APPENDECTOMY    . HEMORROIDECTOMY    . TONSILLECTOMY       Current Meds  Medication Sig  . ALPRAZolam (XANAX) 0.25 MG tablet Take 0.25 mg by mouth 2 (two) times daily as needed for anxiety.   Marland Kitchen aspirin EC 81 MG tablet Take 81 mg by mouth daily as needed. Rarely takes  . Cholecalciferol (VITAMIN D3) 400 units CAPS Take 1 capsule by mouth daily.  . famotidine (PEPCID) 20 MG tablet Take 20 mg by mouth 2 (two) times daily.  . hydrochlorothiazide (MICROZIDE) 12.5 MG capsule Take 12.5 mg by mouth daily.  . metoprolol tartrate (LOPRESSOR) 25 MG tablet Take 12.5 mg by mouth 2 (two) times daily.   . Multiple Vitamin (MULTIVITAMIN) tablet Take 1 tablet by mouth daily.  . potassium chloride SA (K-DUR,KLOR-CON) 20 MEQ tablet Take 0.5 tablets by mouth daily.      Allergies:   Penicillins   Social History   Tobacco Use  . Smoking status: Never Smoker  . Smokeless tobacco: Never Used  Substance Use Topics  . Alcohol use: No  . Drug use: Never     Family Hx: The patient's family history includes Arthritis in her mother; Cancer in her brother; Diabetes in her sister; Hypertension in her sister; Myasthenia gravis in her sister.  ROS:   Please see the history of present illness.     All other systems reviewed and are negative.  Prior CV studies:   The following studies were reviewed today:  Reviewed above  Labs/Other Tests and Data Reviewed:    EKG:  No ECG reviewed.  Recent Labs: No results found for requested labs within last 8760 hours.   Recent Lipid Panel No results found for: CHOL, TRIG, HDL, CHOLHDL, LDLCALC, LDLDIRECT  Wt Readings from Last 3 Encounters:  04/18/19 125 lb (56.7 kg)  08/14/18 130 lb (59 kg)  11/22/17 128 lb (58.1 kg)     Objective:    Vital Signs:  BP 132/68   Pulse 71   Ht 5\' 1"  (1.549 m)   Wt 125 lb (56.7 kg)   BMI 23.62 kg/m    VITAL SIGNS:  reviewed  ASSESSMENT & PLAN:     1. Palpitations:Symptomatically stable.She has a history of paroxysmal SVT.Event monitor reviewed above.She is on metoprolol tartrate 12.5 mg twice daily.  2. Hypertension: Blood pressure is normal.No changes to therapy.  3. Anxiety with possible panic attacks: She currently takes Xanax as needed.Symptomatically stable.  4. Chest pain: Appears to be a GI issue. Resolved with anti-gas meds.    COVID-19 Education: The signs and symptoms of COVID-19 were discussed with the patient and how to seek care for testing (follow up with PCP or arrange E-visit).  The importance of social distancing was discussed today.  Time:   Today, I have spent 15 minutes with the patient with telehealth technology discussing the above problems.     Medication Adjustments/Labs and Tests Ordered: Current medicines are reviewed at length with the patient today.  Concerns regarding medicines are outlined above.   Tests Ordered: No orders of the defined types were placed in this encounter.   Medication Changes: No orders of the defined types were placed in this encounter.   Follow Up:  Virtual Visit  in 6 month(s)  Signed, , MD  04/18/2019 2:24 PM    [facility]

## 2019-04-23 NOTE — Patient Instructions (Signed)

## 2019-06-05 DIAGNOSIS — I10 Essential (primary) hypertension: Secondary | ICD-10-CM | POA: Diagnosis not present

## 2019-06-05 DIAGNOSIS — I471 Supraventricular tachycardia: Secondary | ICD-10-CM | POA: Diagnosis not present

## 2019-06-05 DIAGNOSIS — Z299 Encounter for prophylactic measures, unspecified: Secondary | ICD-10-CM | POA: Diagnosis not present

## 2019-06-05 DIAGNOSIS — K219 Gastro-esophageal reflux disease without esophagitis: Secondary | ICD-10-CM | POA: Diagnosis not present

## 2019-06-07 DIAGNOSIS — K56609 Unspecified intestinal obstruction, unspecified as to partial versus complete obstruction: Secondary | ICD-10-CM | POA: Diagnosis not present

## 2019-06-09 DIAGNOSIS — N281 Cyst of kidney, acquired: Secondary | ICD-10-CM | POA: Diagnosis not present

## 2019-06-09 DIAGNOSIS — K56609 Unspecified intestinal obstruction, unspecified as to partial versus complete obstruction: Secondary | ICD-10-CM | POA: Diagnosis not present

## 2019-06-09 DIAGNOSIS — Z20822 Contact with and (suspected) exposure to covid-19: Secondary | ICD-10-CM | POA: Diagnosis not present

## 2019-06-09 DIAGNOSIS — R1011 Right upper quadrant pain: Secondary | ICD-10-CM | POA: Diagnosis not present

## 2019-06-09 DIAGNOSIS — Z9049 Acquired absence of other specified parts of digestive tract: Secondary | ICD-10-CM | POA: Diagnosis not present

## 2019-06-09 DIAGNOSIS — K449 Diaphragmatic hernia without obstruction or gangrene: Secondary | ICD-10-CM | POA: Diagnosis not present

## 2019-06-09 DIAGNOSIS — K56600 Partial intestinal obstruction, unspecified as to cause: Secondary | ICD-10-CM | POA: Diagnosis not present

## 2019-06-09 DIAGNOSIS — K56699 Other intestinal obstruction unspecified as to partial versus complete obstruction: Secondary | ICD-10-CM | POA: Diagnosis not present

## 2019-06-09 DIAGNOSIS — Z4682 Encounter for fitting and adjustment of non-vascular catheter: Secondary | ICD-10-CM | POA: Diagnosis not present

## 2019-06-09 DIAGNOSIS — I10 Essential (primary) hypertension: Secondary | ICD-10-CM | POA: Diagnosis not present

## 2019-06-09 DIAGNOSIS — K7689 Other specified diseases of liver: Secondary | ICD-10-CM | POA: Diagnosis not present

## 2019-06-19 DIAGNOSIS — I471 Supraventricular tachycardia: Secondary | ICD-10-CM | POA: Diagnosis not present

## 2019-06-19 DIAGNOSIS — Z299 Encounter for prophylactic measures, unspecified: Secondary | ICD-10-CM | POA: Diagnosis not present

## 2019-06-19 DIAGNOSIS — I10 Essential (primary) hypertension: Secondary | ICD-10-CM | POA: Diagnosis not present

## 2019-06-19 DIAGNOSIS — Z8719 Personal history of other diseases of the digestive system: Secondary | ICD-10-CM | POA: Diagnosis not present

## 2019-06-19 DIAGNOSIS — K219 Gastro-esophageal reflux disease without esophagitis: Secondary | ICD-10-CM | POA: Diagnosis not present

## 2019-06-23 DIAGNOSIS — M79674 Pain in right toe(s): Secondary | ICD-10-CM | POA: Diagnosis not present

## 2019-06-23 DIAGNOSIS — R600 Localized edema: Secondary | ICD-10-CM | POA: Diagnosis not present

## 2019-06-23 DIAGNOSIS — M79671 Pain in right foot: Secondary | ICD-10-CM | POA: Diagnosis not present

## 2019-06-23 DIAGNOSIS — M79604 Pain in right leg: Secondary | ICD-10-CM | POA: Diagnosis not present

## 2019-06-23 DIAGNOSIS — M79672 Pain in left foot: Secondary | ICD-10-CM | POA: Diagnosis not present

## 2019-06-23 DIAGNOSIS — Z880 Allergy status to penicillin: Secondary | ICD-10-CM | POA: Diagnosis not present

## 2019-06-23 DIAGNOSIS — M79661 Pain in right lower leg: Secondary | ICD-10-CM | POA: Diagnosis not present

## 2019-06-23 DIAGNOSIS — M7989 Other specified soft tissue disorders: Secondary | ICD-10-CM | POA: Diagnosis not present

## 2019-06-23 DIAGNOSIS — I739 Peripheral vascular disease, unspecified: Secondary | ICD-10-CM | POA: Diagnosis not present

## 2019-06-23 DIAGNOSIS — B351 Tinea unguium: Secondary | ICD-10-CM | POA: Diagnosis not present

## 2019-06-24 DIAGNOSIS — M79661 Pain in right lower leg: Secondary | ICD-10-CM | POA: Diagnosis not present

## 2019-06-24 DIAGNOSIS — M7989 Other specified soft tissue disorders: Secondary | ICD-10-CM | POA: Diagnosis not present

## 2019-07-03 DIAGNOSIS — Z299 Encounter for prophylactic measures, unspecified: Secondary | ICD-10-CM | POA: Diagnosis not present

## 2019-07-03 DIAGNOSIS — E559 Vitamin D deficiency, unspecified: Secondary | ICD-10-CM | POA: Diagnosis not present

## 2019-07-03 DIAGNOSIS — Z0000 Encounter for general adult medical examination without abnormal findings: Secondary | ICD-10-CM | POA: Diagnosis not present

## 2019-07-03 DIAGNOSIS — Z7189 Other specified counseling: Secondary | ICD-10-CM | POA: Diagnosis not present

## 2019-07-03 DIAGNOSIS — Z1211 Encounter for screening for malignant neoplasm of colon: Secondary | ICD-10-CM | POA: Diagnosis not present

## 2019-07-03 DIAGNOSIS — I10 Essential (primary) hypertension: Secondary | ICD-10-CM | POA: Diagnosis not present

## 2019-07-03 DIAGNOSIS — Z79899 Other long term (current) drug therapy: Secondary | ICD-10-CM | POA: Diagnosis not present

## 2019-07-03 DIAGNOSIS — R5383 Other fatigue: Secondary | ICD-10-CM | POA: Diagnosis not present

## 2019-07-03 DIAGNOSIS — Z681 Body mass index (BMI) 19 or less, adult: Secondary | ICD-10-CM | POA: Diagnosis not present

## 2019-07-08 DIAGNOSIS — H401123 Primary open-angle glaucoma, left eye, severe stage: Secondary | ICD-10-CM | POA: Diagnosis not present

## 2019-08-21 DIAGNOSIS — H401123 Primary open-angle glaucoma, left eye, severe stage: Secondary | ICD-10-CM | POA: Diagnosis not present

## 2019-08-21 DIAGNOSIS — H524 Presbyopia: Secondary | ICD-10-CM | POA: Diagnosis not present

## 2019-09-02 DIAGNOSIS — G5690 Unspecified mononeuropathy of unspecified upper limb: Secondary | ICD-10-CM | POA: Diagnosis not present

## 2019-09-02 DIAGNOSIS — Z681 Body mass index (BMI) 19 or less, adult: Secondary | ICD-10-CM | POA: Diagnosis not present

## 2019-09-02 DIAGNOSIS — I10 Essential (primary) hypertension: Secondary | ICD-10-CM | POA: Diagnosis not present

## 2019-09-02 DIAGNOSIS — Z299 Encounter for prophylactic measures, unspecified: Secondary | ICD-10-CM | POA: Diagnosis not present

## 2019-09-02 DIAGNOSIS — I471 Supraventricular tachycardia: Secondary | ICD-10-CM | POA: Diagnosis not present

## 2019-09-02 DIAGNOSIS — E041 Nontoxic single thyroid nodule: Secondary | ICD-10-CM | POA: Diagnosis not present

## 2019-09-08 ENCOUNTER — Other Ambulatory Visit: Payer: Self-pay

## 2019-09-08 DIAGNOSIS — G5690 Unspecified mononeuropathy of unspecified upper limb: Secondary | ICD-10-CM

## 2019-09-09 ENCOUNTER — Ambulatory Visit (INDEPENDENT_AMBULATORY_CARE_PROVIDER_SITE_OTHER): Payer: Medicare Other | Admitting: Neurology

## 2019-09-09 ENCOUNTER — Other Ambulatory Visit: Payer: Self-pay

## 2019-09-09 DIAGNOSIS — G5603 Carpal tunnel syndrome, bilateral upper limbs: Secondary | ICD-10-CM | POA: Diagnosis not present

## 2019-09-09 DIAGNOSIS — G5690 Unspecified mononeuropathy of unspecified upper limb: Secondary | ICD-10-CM

## 2019-09-09 DIAGNOSIS — G5622 Lesion of ulnar nerve, left upper limb: Secondary | ICD-10-CM

## 2019-09-09 NOTE — Procedures (Signed)
[facility]  [address], [address]  [place], [place] [address] Tel: [phone] Fax:  [phone] Test Date:  09/09/2019  Patient: Dawn Estrada DOB: [birth year] Physician: Nita Sickle, DO  Sex: Female Height: 5\' 1"  Ref Phys: , M.D.  ID#: Kirstie Peri Temp: 32.0C Technician:    Patient Complaints: This is a 86 year old female referred for evaluation of bilateral hand numbness and tingling.  NCV & EMG Findings: Extensive electrodiagnostic testing of the right upper extremity and additional studies of the left shows:  1. Bilateral median sensory responses are absent. Bilateral ulnar sensory responses are within normal limits. 2. Right median motor response shows severely prolonged latency (8.0 ms) and reduced amplitude (3.5 mV). Left median motor response shows prolonged distal onset latency (5.9 ms). Left ulnar motor response shows slowed conduction velocity across the elbow (A Elbow-B Elbow, 40 m/s). Right ulnar motor responses within normal limits.  3. Chronic motor axonal loss changes are seen affecting bilateral abductor pollicis brevis muscles and the left first dorsal interosseous muscles. There is no evidence of accompanied active denervation   Impression: 1. Bilateral median neuropathy at or distal to the wrist, consistent with a clinical diagnosis of carpal tunnel syndrome.  Overall, these findings are severe in degree electrically, and worse on the right. 2. Left ulnar neuropathy with slowing across the elbow, mild.   ___________________________ 92, DO    Nerve Conduction Studies Anti Sensory Summary Table   Stim Site NR Peak (ms) Norm Peak (ms) P-T Amp (V) Norm P-T Amp  Left Median Anti Sensory (2nd Digit)  Wrist NR  <3.8  >10  Right Median Anti Sensory (2nd Digit)  32C  Wrist NR  <3.8  >10  Left Ulnar Anti Sensory (5th Digit)  Wrist    3.0 <3.2 9.1 >5  Right Ulnar Anti Sensory (5th Digit)  32C  Wrist    2.7 <3.2 8.3 >5    Motor Summary Table   Stim Site NR Onset (ms) Norm Onset (ms) O-P Amp (mV) Norm O-P Amp Site1 Site2 Delta-0 (ms) Dist (cm) Vel (m/s) Norm Vel (m/s)  Left Median Motor (Abd Poll Brev)  Wrist    5.9 <4.0 5.5 >5 Elbow Wrist 5.5 29.0 53 >50  Elbow    11.4  4.2         Right Median Motor (Abd Poll Brev)  32C  Wrist    8.0 <4.0 3.5 >5 Elbow Wrist 5.4 27.0 50 >50  Elbow    13.4  3.0         Left Ulnar Motor (Abd Dig Minimi)  Wrist    2.5 <3.1 9.6 >7 B Elbow Wrist 4.5 24.0 53 >50  B Elbow    7.0  9.4  A Elbow B Elbow 2.5 10.0 40 >50  A Elbow    9.5  8.7         Right Ulnar Motor (Abd Dig Minimi)  32C  Wrist    2.4 <3.1 10.2 >7 B Elbow Wrist 4.0 24.0 60 >50  B Elbow    6.4  9.6  A Elbow B Elbow 1.9 10.0 53 >50  A Elbow    8.3  9.4          EMG   Side Muscle Ins Act Fibs Psw Fasc Number Recrt Dur Dur. Amp Amp. Poly Poly. Comment  Left 1stDorInt Nml Nml Nml Nml 2- Rapid Some 1+ Some 1+ Nml Nml N/A  Left Abd Poll Brev Nml Nml Nml Nml 2-  Rapid Many 1+ Some 1+ Some 1+ N/A  Left PronatorTeres Nml Nml Nml Nml Nml Nml Nml Nml Nml Nml Nml Nml N/A  Left Biceps Nml Nml Nml Nml Nml Nml Nml Nml Nml Nml Nml Nml N/A  Left Triceps Nml Nml Nml Nml Nml Nml Nml Nml Nml Nml Nml Nml N/A  Left Deltoid Nml Nml Nml Nml Nml Nml Nml Nml Nml Nml Nml Nml N/A  Left FlexDigProf 4,5 Nml Nml Nml Nml Nml Nml Nml Nml Nml Nml Nml Nml N/A  Right 1stDorInt Nml Nml Nml Nml Nml Nml Nml Nml Nml Nml Nml Nml N/A  Right Abd Poll Brev Nml Nml Nml Nml 3- Rapid Many 1+ Many 1+ Some 1+ N/A  Right PronatorTeres Nml Nml Nml Nml Nml Nml Nml Nml Nml Nml Nml Nml N/A  Right Biceps Nml Nml Nml Nml Nml Nml Nml Nml Nml Nml Nml Nml N/A  Right Triceps Nml Nml Nml Nml Nml Nml Nml Nml Nml Nml Nml Nml N/A  Right Deltoid Nml Nml Nml Nml Nml Nml Nml Nml Nml Nml Nml Nml N/A      Waveforms:

## 2019-09-15 DIAGNOSIS — I739 Peripheral vascular disease, unspecified: Secondary | ICD-10-CM | POA: Diagnosis not present

## 2019-09-15 DIAGNOSIS — M79674 Pain in right toe(s): Secondary | ICD-10-CM | POA: Diagnosis not present

## 2019-09-15 DIAGNOSIS — L110 Acquired keratosis follicularis: Secondary | ICD-10-CM | POA: Diagnosis not present

## 2019-09-15 DIAGNOSIS — M79672 Pain in left foot: Secondary | ICD-10-CM | POA: Diagnosis not present

## 2019-09-15 DIAGNOSIS — M79671 Pain in right foot: Secondary | ICD-10-CM | POA: Diagnosis not present

## 2019-09-18 DIAGNOSIS — E041 Nontoxic single thyroid nodule: Secondary | ICD-10-CM | POA: Diagnosis not present

## 2019-09-18 DIAGNOSIS — E042 Nontoxic multinodular goiter: Secondary | ICD-10-CM | POA: Diagnosis not present

## 2019-09-23 DIAGNOSIS — H04123 Dry eye syndrome of bilateral lacrimal glands: Secondary | ICD-10-CM | POA: Diagnosis not present

## 2019-09-23 DIAGNOSIS — H11823 Conjunctivochalasis, bilateral: Secondary | ICD-10-CM | POA: Diagnosis not present

## 2019-09-25 DIAGNOSIS — Z299 Encounter for prophylactic measures, unspecified: Secondary | ICD-10-CM | POA: Diagnosis not present

## 2019-09-25 DIAGNOSIS — G5600 Carpal tunnel syndrome, unspecified upper limb: Secondary | ICD-10-CM | POA: Diagnosis not present

## 2019-09-25 DIAGNOSIS — I471 Supraventricular tachycardia: Secondary | ICD-10-CM | POA: Diagnosis not present

## 2019-09-25 DIAGNOSIS — Z681 Body mass index (BMI) 19 or less, adult: Secondary | ICD-10-CM | POA: Diagnosis not present

## 2019-09-25 DIAGNOSIS — G5601 Carpal tunnel syndrome, right upper limb: Secondary | ICD-10-CM | POA: Diagnosis not present

## 2019-09-25 DIAGNOSIS — I10 Essential (primary) hypertension: Secondary | ICD-10-CM | POA: Diagnosis not present

## 2019-10-07 DIAGNOSIS — K219 Gastro-esophageal reflux disease without esophagitis: Secondary | ICD-10-CM | POA: Diagnosis not present

## 2019-10-07 DIAGNOSIS — E559 Vitamin D deficiency, unspecified: Secondary | ICD-10-CM | POA: Diagnosis not present

## 2019-10-07 DIAGNOSIS — I10 Essential (primary) hypertension: Secondary | ICD-10-CM | POA: Diagnosis not present

## 2019-10-07 DIAGNOSIS — M1990 Unspecified osteoarthritis, unspecified site: Secondary | ICD-10-CM | POA: Diagnosis not present

## 2019-10-21 DIAGNOSIS — R079 Chest pain, unspecified: Secondary | ICD-10-CM | POA: Diagnosis not present

## 2019-10-21 DIAGNOSIS — I10 Essential (primary) hypertension: Secondary | ICD-10-CM | POA: Diagnosis not present

## 2019-10-21 DIAGNOSIS — Z299 Encounter for prophylactic measures, unspecified: Secondary | ICD-10-CM | POA: Diagnosis not present

## 2019-10-21 DIAGNOSIS — Z681 Body mass index (BMI) 19 or less, adult: Secondary | ICD-10-CM | POA: Diagnosis not present

## 2019-10-21 DIAGNOSIS — M1990 Unspecified osteoarthritis, unspecified site: Secondary | ICD-10-CM | POA: Diagnosis not present

## 2019-10-21 DIAGNOSIS — R002 Palpitations: Secondary | ICD-10-CM | POA: Diagnosis not present

## 2019-10-27 NOTE — Progress Notes (Signed)
Cardiology Office Note  Date: 10/28/2019   ID: Dawn, Estrada 1919/02/26, MRN [id number]  PCP:  Kirstie Peri, MD  Cardiologist:  Prentice Docker, MD (Inactive) Electrophysiologist:  None   Chief Complaint: Palpitations  History of Present Illness: Dawn Estrada is a 86 y.o. female with a history of palpitations, HTN, anxiety, chest pain.  Prior event monitoring demonstrated sinus rhythm with PACs and PVCs and PSVT longest lasting 8 beats.,  Stated SVT may have been atrial tachycardia with variable block.  Last encounter with Dr. Purvis Sheffield 04/18/2019 with telemedicine.  Having occasional chest pain and occasional left arm pain occurring once or twice a week.  States she took something for gas and symptoms resolved.  Has chronic leg weakness.  Denies any current palpitations.  She was taking metoprolol 12.5 mg p.o. twice daily.  Blood pressure was normal and no changes to therapy.  Was taking Xanax for anxiety and was symptomatically stable.  Chest thought to be a GI issue resolved with anti-gas medications.  This is a pleasant 86 year old African-American female here for 47-month follow-up.  She is here with her son.  She denies any significant issues recently.  She did have recent admission for right-sided calf pain [facility].  Her D-dimer was elevated at 3.88.  She had a negative right lower venous study for DVT.  Patient states she has occasional minor chest pain at rest which is very short-lived without radiation or associated symptoms.  States at times it is relieved with belching.  States her HCTZ was stopped at recent hospital stay states she is unsure as to why the medication was stopped.  She states since that time she is noticed more swelling in her lower extremities.  She denies any increased weight gain, or shortness of breath on exertion.  Otherwise she denies any palpitations or arrhythmias, PND, orthopnea, CVA or TIA-like symptoms, orthostatic symptoms,  claudication-like symptoms, DVT or PE-like symptoms.  Does have some mild nonpitting lower extremity edema since her HCTZ was stopped.   Past Medical History:  Diagnosis Date  . Anxiety   . Arthritis   . Hiatal hernia   . Hypertension   . Reflux     Past Surgical History:  Procedure Laterality Date  . ABDOMINAL HYSTERECTOMY    . ABDOMINAL SURGERY     resection  . APPENDECTOMY    . HEMORROIDECTOMY    . TONSILLECTOMY      Current Outpatient Medications  Medication Sig Dispense Refill  . ALPRAZolam (XANAX) 0.25 MG tablet Take 0.25 mg by mouth 2 (two) times daily as needed for anxiety.     . Cholecalciferol (VITAMIN D3) 400 units CAPS Take 1 capsule by mouth daily.  7  . hydrochlorothiazide (MICROZIDE) 12.5 MG capsule Take one tablet (12.5mg ) by mouth on Monday and Thursday only.    . metoprolol tartrate (LOPRESSOR) 25 MG tablet Take 12.5 mg by mouth 2 (two) times daily.   1  . Multiple Vitamin (MULTIVITAMIN) tablet Take 1 tablet by mouth daily.    Marland Kitchen OMEPRAZOLE PO Take by mouth.    . potassium chloride SA (K-DUR,KLOR-CON) 20 MEQ tablet Take 0.5 tablets by mouth daily.      No current facility-administered medications for this visit.   Allergies:  Penicillins   Social History: The patient  reports that she has never smoked. She has never used smokeless tobacco. She reports that she does not drink alcohol and does not use drugs.   Family History: The patient's family  history includes Arthritis in her mother; Cancer in her brother; Diabetes in her sister; Hypertension in her sister; Myasthenia gravis in her sister.   ROS:  Please see the history of present illness. Otherwise, complete review of systems is positive for none.  All other systems are reviewed and negative.   Physical Exam: VS:  BP (!) 152/64   Pulse (!) 58   Ht 5\' 6"  (1.676 m)   Wt 130 lb (59 kg)   SpO2 98%   BMI 20.98 kg/m , BMI Body mass index is 20.98 kg/m.  Wt Readings from Last 3 Encounters:  10/28/19  130 lb (59 kg)  04/18/19 125 lb (56.7 kg)  08/14/18 130 lb (59 kg)    General: Patient appears comfortable at rest. Neck: Supple, no elevated JVP or carotid bruits, no thyromegaly. Lungs: Clear to auscultation, nonlabored breathing at rest. Cardiac: Regular rate and rhythm, no S3 or significant systolic murmur, no pericardial rub. Extremities: Mild non-pitting edema, distal pulses 2+. Skin: Warm and dry. Musculoskeletal: No kyphosis. Neuropsychiatric: Alert and oriented x3, affect grossly appropriate.  ECG:  An ECG dated 10/28/2019 was personally reviewed today and demonstrated:  Normal sinus rhythm, left axis deviation rate of 67, possible anterior lateral infarct, age undetermined.  Recent Labwork: No results found for requested labs within last 8760 hours.  No results found for: CHOL, TRIG, HDL, CHOLHDL, VLDL, LDLCALC, LDLDIRECT  Other Studies Reviewed Today:  Right lower extremity venous Doppler ultrasound 06/24/2019 CLINICAL DATA: 86 year old female with right calf pain and  swelling.    EXAM:  RIGHT LOWER EXTREMITY VENOUS DOPPLER ULTRASOUND    TECHNIQUE:  Gray-scale sonography with compression, as well as color and duplex  ultrasound, were performed to evaluate the deep venous system(s)  from the level of the common femoral vein through the popliteal and  proximal calf veins.    COMPARISON: None.    FINDINGS:  VENOUS    Normal compressibility of the common femoral, superficial femoral,  and popliteal veins, as well as the visualized calf veins.  Visualized portions of profunda femoral vein and great saphenous  vein unremarkable. No filling defects to suggest DVT on grayscale or  color Doppler imaging. Doppler waveforms show normal direction of  venous flow, normal respiratory plasticity and response to  augmentation.    Limited views of the contralateral common femoral vein are  Unremarkable.  [facility] 09/09/2019 This is a  86 year old female referred for evaluation of bilateral hand numbness and tingling.  NCV & EMG Findings: Extensive electrodiagnostic testing of the right upper extremity and additional studies of the left shows:  1. Bilateral median sensory responses are absent. Bilateral ulnar sensory responses are within normal limits. 2. Right median motor response shows severely prolonged latency (8.0 ms) and reduced amplitude (3.5 mV). Left median motor response shows prolonged distal onset latency (5.9 ms). Left ulnar motor response shows slowed conduction velocity across the elbow (A Elbow-B Elbow, 40 m/s). Right ulnar motor responses within normal limits.  3. Chronic motor axonal loss changes are seen affecting bilateral abductor pollicis brevis muscles and the left first dorsal interosseous muscles. There is no evidence of accompanied active denervation   Impression: 1. Bilateral median neuropathy at or distal to the wrist, consistent with a clinical diagnosis of carpal tunnel syndrome.  Overall, these findings are severe in degree electrically, and worse on the right. 2. Left ulnar neuropathy with slowing across the elbow, mild.   Echocardiogram 11/26/2014  Study Conclusions   - Left ventricle: The  cavity size was normal. Wall thickness was  increased in a pattern of mild LVH. Systolic function was  vigorous. The estimated ejection fraction was in the range of 65%  to 70%. Wall motion was normal; there were no regional wall  motion abnormalities. Doppler parameters are consistent with  abnormal left ventricular relaxation (grade 1 diastolic  dysfunction). Doppler parameters are consistent with high  ventricular filling pressure.     Assessment and Plan:  1. Palpitations   2. Essential hypertension   3. Chest pain, unspecified type   4. Lower extremity edema     1. Palpitations Denies any recent palpitations or arrhythmias.  Continue metoprolol 12.5 mg p.o. twice  daily.  2. Essential hypertension Blood pressure elevated today at 152/64.  Son who is with her states her blood pressure at home is usually in the 130s systolic.  Continue metoprolol 12.5 mg p.o. twice daily.  3. Chest pain, unspecified type Mild intermittent resting chest pain which is transient in nature without radiation or associated symptoms.  Patient states symptoms improve if she sometimes belches.  She has a history of GERD and takes omeprazole.  4.  Lower extremity edema Has some mild nonpitting lower extremity edema.  Patient states her HCTZ was recently stopped during her recent hospital stay.  Restart HCTZ 12.5 mg p.o. twice weekly on Mondays and Thursdays.  Continue potassium chloride 10 mEq daily.    Medication Adjustments/Labs and Tests Ordered: Current medicines are reviewed at length with the patient today.  Concerns regarding medicines are outlined above.   Disposition: Follow-up with Dr. Wyline Mood or APP 6 months  Signed, Rennis Harding, NP 10/28/2019 4:48 PM    [facility] [address] [place], [place], [place] [address] Phone: [phone]; Fax: [phone]

## 2019-10-28 ENCOUNTER — Encounter: Payer: Self-pay | Admitting: Family Medicine

## 2019-10-28 ENCOUNTER — Ambulatory Visit (INDEPENDENT_AMBULATORY_CARE_PROVIDER_SITE_OTHER): Payer: Medicare Other | Admitting: Family Medicine

## 2019-10-28 ENCOUNTER — Ambulatory Visit: Payer: Medicare Other | Admitting: Cardiovascular Disease

## 2019-10-28 VITALS — BP 152/64 | HR 58 | Ht 66.0 in | Wt 130.0 lb

## 2019-10-28 DIAGNOSIS — R002 Palpitations: Secondary | ICD-10-CM

## 2019-10-28 DIAGNOSIS — R079 Chest pain, unspecified: Secondary | ICD-10-CM | POA: Diagnosis not present

## 2019-10-28 DIAGNOSIS — R600 Localized edema: Secondary | ICD-10-CM | POA: Diagnosis not present

## 2019-10-28 DIAGNOSIS — I10 Essential (primary) hypertension: Secondary | ICD-10-CM | POA: Diagnosis not present

## 2019-10-28 MED ORDER — HYDROCHLOROTHIAZIDE 12.5 MG PO CAPS: ORAL_CAPSULE | ORAL | Status: DC

## 2019-10-28 NOTE — Patient Instructions (Signed)
Medication Instructions:   Restart your HCTZ at 12.5mg  on Monday and Thursday only.   Continue all other medications.    Labwork:  BMET, Magnesium - orders given today.   Please do in 2 weeks, around 11/11/19.  Office will contact with results via phone or letter.    Testing/Procedures: none  Follow-Up: 6 months   Any Other Special Instructions Will Be Listed Below (If Applicable).  If you need a refill on your cardiac medications before your next appointment, please call your pharmacy.

## 2019-11-13 DIAGNOSIS — I10 Essential (primary) hypertension: Secondary | ICD-10-CM | POA: Diagnosis not present

## 2019-12-22 DIAGNOSIS — E538 Deficiency of other specified B group vitamins: Secondary | ICD-10-CM | POA: Diagnosis not present

## 2019-12-22 DIAGNOSIS — Z23 Encounter for immunization: Secondary | ICD-10-CM | POA: Diagnosis not present

## 2019-12-22 DIAGNOSIS — I10 Essential (primary) hypertension: Secondary | ICD-10-CM | POA: Diagnosis not present

## 2019-12-22 DIAGNOSIS — I471 Supraventricular tachycardia: Secondary | ICD-10-CM | POA: Diagnosis not present

## 2019-12-22 DIAGNOSIS — Z299 Encounter for prophylactic measures, unspecified: Secondary | ICD-10-CM | POA: Diagnosis not present

## 2020-01-06 DIAGNOSIS — M79672 Pain in left foot: Secondary | ICD-10-CM | POA: Diagnosis not present

## 2020-01-06 DIAGNOSIS — I739 Peripheral vascular disease, unspecified: Secondary | ICD-10-CM | POA: Diagnosis not present

## 2020-01-06 DIAGNOSIS — M79674 Pain in right toe(s): Secondary | ICD-10-CM | POA: Diagnosis not present

## 2020-01-06 DIAGNOSIS — E559 Vitamin D deficiency, unspecified: Secondary | ICD-10-CM | POA: Diagnosis not present

## 2020-01-06 DIAGNOSIS — L110 Acquired keratosis follicularis: Secondary | ICD-10-CM | POA: Diagnosis not present

## 2020-01-06 DIAGNOSIS — I10 Essential (primary) hypertension: Secondary | ICD-10-CM | POA: Diagnosis not present

## 2020-01-06 DIAGNOSIS — K219 Gastro-esophageal reflux disease without esophagitis: Secondary | ICD-10-CM | POA: Diagnosis not present

## 2020-01-06 DIAGNOSIS — M79671 Pain in right foot: Secondary | ICD-10-CM | POA: Diagnosis not present

## 2020-01-22 DIAGNOSIS — N390 Urinary tract infection, site not specified: Secondary | ICD-10-CM | POA: Diagnosis not present

## 2020-01-22 DIAGNOSIS — Z299 Encounter for prophylactic measures, unspecified: Secondary | ICD-10-CM | POA: Diagnosis not present

## 2020-01-22 DIAGNOSIS — Z681 Body mass index (BMI) 19 or less, adult: Secondary | ICD-10-CM | POA: Diagnosis not present

## 2020-01-22 DIAGNOSIS — N183 Chronic kidney disease, stage 3 unspecified: Secondary | ICD-10-CM | POA: Diagnosis not present

## 2020-01-22 DIAGNOSIS — M1990 Unspecified osteoarthritis, unspecified site: Secondary | ICD-10-CM | POA: Diagnosis not present

## 2020-01-22 DIAGNOSIS — R5383 Other fatigue: Secondary | ICD-10-CM | POA: Diagnosis not present

## 2020-01-22 DIAGNOSIS — I471 Supraventricular tachycardia: Secondary | ICD-10-CM | POA: Diagnosis not present

## 2020-01-22 DIAGNOSIS — R350 Frequency of micturition: Secondary | ICD-10-CM | POA: Diagnosis not present

## 2020-02-06 DIAGNOSIS — I10 Essential (primary) hypertension: Secondary | ICD-10-CM | POA: Diagnosis not present

## 2020-02-06 DIAGNOSIS — E559 Vitamin D deficiency, unspecified: Secondary | ICD-10-CM | POA: Diagnosis not present

## 2020-02-06 DIAGNOSIS — K219 Gastro-esophageal reflux disease without esophagitis: Secondary | ICD-10-CM | POA: Diagnosis not present

## 2020-03-10 DIAGNOSIS — Z299 Encounter for prophylactic measures, unspecified: Secondary | ICD-10-CM | POA: Diagnosis not present

## 2020-03-10 DIAGNOSIS — K219 Gastro-esophageal reflux disease without esophagitis: Secondary | ICD-10-CM | POA: Diagnosis not present

## 2020-03-10 DIAGNOSIS — I10 Essential (primary) hypertension: Secondary | ICD-10-CM | POA: Diagnosis not present

## 2020-03-22 DIAGNOSIS — I10 Essential (primary) hypertension: Secondary | ICD-10-CM | POA: Diagnosis not present

## 2020-03-22 DIAGNOSIS — N183 Chronic kidney disease, stage 3 unspecified: Secondary | ICD-10-CM | POA: Diagnosis not present

## 2020-03-22 DIAGNOSIS — I471 Supraventricular tachycardia: Secondary | ICD-10-CM | POA: Diagnosis not present

## 2020-03-22 DIAGNOSIS — Z299 Encounter for prophylactic measures, unspecified: Secondary | ICD-10-CM | POA: Diagnosis not present

## 2020-03-22 DIAGNOSIS — Z789 Other specified health status: Secondary | ICD-10-CM | POA: Diagnosis not present

## 2020-03-22 DIAGNOSIS — R2681 Unsteadiness on feet: Secondary | ICD-10-CM | POA: Diagnosis not present

## 2020-03-23 DIAGNOSIS — M79672 Pain in left foot: Secondary | ICD-10-CM | POA: Diagnosis not present

## 2020-03-23 DIAGNOSIS — M79671 Pain in right foot: Secondary | ICD-10-CM | POA: Diagnosis not present

## 2020-03-23 DIAGNOSIS — M79674 Pain in right toe(s): Secondary | ICD-10-CM | POA: Diagnosis not present

## 2020-03-23 DIAGNOSIS — I739 Peripheral vascular disease, unspecified: Secondary | ICD-10-CM | POA: Diagnosis not present

## 2020-03-23 DIAGNOSIS — L110 Acquired keratosis follicularis: Secondary | ICD-10-CM | POA: Diagnosis not present

## 2020-03-23 DIAGNOSIS — M79675 Pain in left toe(s): Secondary | ICD-10-CM | POA: Diagnosis not present

## 2020-03-24 DIAGNOSIS — R002 Palpitations: Secondary | ICD-10-CM | POA: Diagnosis not present

## 2020-03-24 DIAGNOSIS — K5900 Constipation, unspecified: Secondary | ICD-10-CM | POA: Diagnosis not present

## 2020-03-24 DIAGNOSIS — N183 Chronic kidney disease, stage 3 unspecified: Secondary | ICD-10-CM | POA: Diagnosis not present

## 2020-03-24 DIAGNOSIS — I10 Essential (primary) hypertension: Secondary | ICD-10-CM | POA: Diagnosis not present

## 2020-03-24 DIAGNOSIS — Z299 Encounter for prophylactic measures, unspecified: Secondary | ICD-10-CM | POA: Diagnosis not present

## 2020-03-24 DIAGNOSIS — I471 Supraventricular tachycardia: Secondary | ICD-10-CM | POA: Diagnosis not present

## 2020-03-29 DIAGNOSIS — I10 Essential (primary) hypertension: Secondary | ICD-10-CM | POA: Diagnosis not present

## 2020-03-29 DIAGNOSIS — Z299 Encounter for prophylactic measures, unspecified: Secondary | ICD-10-CM | POA: Diagnosis not present

## 2020-03-29 DIAGNOSIS — R002 Palpitations: Secondary | ICD-10-CM | POA: Diagnosis not present

## 2020-03-30 DIAGNOSIS — I10 Essential (primary) hypertension: Secondary | ICD-10-CM | POA: Diagnosis not present

## 2020-04-08 ENCOUNTER — Other Ambulatory Visit: Payer: Self-pay

## 2020-04-08 ENCOUNTER — Emergency Department (HOSPITAL_COMMUNITY)
Admission: EM | Admit: 2020-04-08 | Discharge: 2020-04-08 | Disposition: A | Discharge status: Home / Self Care | Payer: Medicare Other | Attending: Emergency Medicine | Admitting: Emergency Medicine

## 2020-04-08 ENCOUNTER — Encounter (HOSPITAL_COMMUNITY): Payer: Self-pay | Admitting: Emergency Medicine

## 2020-04-08 ENCOUNTER — Emergency Department (HOSPITAL_COMMUNITY): Payer: Medicare Other

## 2020-04-08 DIAGNOSIS — I10 Essential (primary) hypertension: Secondary | ICD-10-CM | POA: Insufficient documentation

## 2020-04-08 DIAGNOSIS — W01198A Fall on same level from slipping, tripping and stumbling with subsequent striking against other object, initial encounter: Secondary | ICD-10-CM | POA: Insufficient documentation

## 2020-04-08 DIAGNOSIS — W19XXXA Unspecified fall, initial encounter: Secondary | ICD-10-CM

## 2020-04-08 DIAGNOSIS — R519 Headache, unspecified: Secondary | ICD-10-CM

## 2020-04-08 DIAGNOSIS — R030 Elevated blood-pressure reading, without diagnosis of hypertension: Secondary | ICD-10-CM

## 2020-04-08 DIAGNOSIS — S0990XA Unspecified injury of head, initial encounter: Secondary | ICD-10-CM | POA: Diagnosis not present

## 2020-04-08 DIAGNOSIS — E876 Hypokalemia: Secondary | ICD-10-CM | POA: Diagnosis not present

## 2020-04-08 DIAGNOSIS — Z79899 Other long term (current) drug therapy: Secondary | ICD-10-CM | POA: Insufficient documentation

## 2020-04-08 LAB — URINALYSIS, ROUTINE W REFLEX MICROSCOPIC
Bilirubin Urine: NEGATIVE
Glucose, UA: NEGATIVE mg/dL
Hgb urine dipstick: NEGATIVE
Ketones, ur: NEGATIVE mg/dL
Nitrite: NEGATIVE
Protein, ur: NEGATIVE mg/dL
Specific Gravity, Urine: 1.009 (ref 1.005–1.030)
pH: 7 (ref 5.0–8.0)

## 2020-04-08 LAB — COMPREHENSIVE METABOLIC PANEL
ALT: 13 U/L (ref 0–44)
AST: 20 U/L (ref 15–41)
Albumin: 3.1 g/dL — ABNORMAL LOW (ref 3.5–5.0)
Alkaline Phosphatase: 54 U/L (ref 38–126)
Anion gap: 10 (ref 5–15)
BUN: 8 mg/dL (ref 8–23)
CO2: 23 mmol/L (ref 22–32)
Calcium: 8.6 mg/dL — ABNORMAL LOW (ref 8.9–10.3)
Chloride: 108 mmol/L (ref 98–111)
Creatinine, Ser: 0.92 mg/dL (ref 0.44–1.00)
GFR, Estimated: 56 mL/min — ABNORMAL LOW (ref >60)
Glucose, Bld: 132 mg/dL — ABNORMAL HIGH (ref 70–99)
Potassium: 3.3 mmol/L — ABNORMAL LOW (ref 3.5–5.1)
Sodium: 141 mmol/L (ref 135–145)
Total Bilirubin: 1.1 mg/dL (ref 0.3–1.2)
Total Protein: 6.4 g/dL — ABNORMAL LOW (ref 6.5–8.1)

## 2020-04-08 LAB — CBC WITH DIFFERENTIAL/PLATELET
Abs Immature Granulocytes: 0.01 10*3/uL (ref 0.00–0.07)
Basophils Absolute: 0 10*3/uL (ref 0.0–0.1)
Basophils Relative: 1 %
Eosinophils Absolute: 0 10*3/uL (ref 0.0–0.5)
Eosinophils Relative: 1 %
HCT: 38.9 % (ref 36.0–46.0)
Hemoglobin: 12.1 g/dL (ref 12.0–15.0)
Immature Granulocytes: 0 %
Lymphocytes Relative: 29 %
Lymphs Abs: 1.5 10*3/uL (ref 0.7–4.0)
MCH: 29.2 pg (ref 26.0–34.0)
MCHC: 31.1 g/dL (ref 30.0–36.0)
MCV: 94 fL (ref 80.0–100.0)
Monocytes Absolute: 0.4 10*3/uL (ref 0.1–1.0)
Monocytes Relative: 7 %
Neutro Abs: 3.1 10*3/uL (ref 1.7–7.7)
Neutrophils Relative %: 62 %
Platelets: 158 10*3/uL (ref 150–400)
RBC: 4.14 MIL/uL (ref 3.87–5.11)
RDW: 13.7 % (ref 11.5–15.5)
WBC: 5 10*3/uL (ref 4.0–10.5)
nRBC: 0 % (ref 0.0–0.2)

## 2020-04-08 MED ORDER — POTASSIUM CHLORIDE CRYS ER 20 MEQ PO TBCR
40.0000 meq | EXTENDED_RELEASE_TABLET | Freq: Once | ORAL | Status: AC
  Administered 2020-04-08: 40 meq via ORAL
  Filled 2020-04-08: qty 2

## 2020-04-08 NOTE — Discharge Instructions (Addendum)
Check blood pressure once daily. Record readings and follow-up with your doctor.

## 2020-04-08 NOTE — ED Triage Notes (Signed)
Pt c/o high bp off and on for about 2 weeks. Pt fell also 2 weeks and still having pain in front and back of head.

## 2020-04-08 NOTE — ED Notes (Signed)

## 2020-04-08 NOTE — ED Provider Notes (Signed)
[facility] EMERGENCY DEPARTMENT Provider Note   CSN: [id number] Arrival date & time: 04/08/20  1257     History Chief Complaint  Patient presents with  . Hypertension    Dawn Estrada is a 87 y.o. female.  87 year old female with history of HTN, brought in by family for feeling unwell for the past 2 weeks. Patient reported to family that she stumbled and hit her head in the doorframe 2 weeks ago, patient was seen by PCP at that time who felt she was fine and no imaging was ordered. Since that time, has reported feeling nauseous, headaches, generally unwell. Denies changes in bowel or bladder habits, chest pain, abdominal pain. Patient's family checks her BP three times daily, has been to PCP for medication adjustment yet BP remains high. Increase in BP meds caused patient to feel unwell and meds were decreased.         Past Medical History:  Diagnosis Date  . Anxiety   . Arthritis   . Hiatal hernia   . Hypertension   . Reflux     Patient Active Problem List   Diagnosis Date Noted  . Hypertension   . CAP (community acquired pneumonia) 11/25/2014  . Generalized weakness 11/25/2014  . Hypokalemia 11/25/2014  . Elevated troponin 11/25/2014  . Chest pain 11/25/2014  . Leukocytosis 11/25/2014  . Gastroenteritis 11/25/2014  . Sinus tachycardia 11/25/2014    Past Surgical History:  Procedure Laterality Date  . ABDOMINAL HYSTERECTOMY    . ABDOMINAL SURGERY     resection  . APPENDECTOMY    . HEMORROIDECTOMY    . TONSILLECTOMY       OB History    Gravida  3   Para  3   Term  3   Preterm      AB      Living        SAB      IAB      Ectopic      Multiple      Live Births              Family History  Problem Relation Age of Onset  . Arthritis Mother   . Myasthenia gravis Sister   . Cancer Brother   . Hypertension Sister   . Diabetes Sister     Social History   Tobacco Use  . Smoking status: Never Smoker  . Smokeless tobacco: Never  Used  Vaping Use  . Vaping Use: Never used  Substance Use Topics  . Alcohol use: No  . Drug use: Never    Home Medications Prior to Admission medications   Medication Sig Start Date End Date Taking? Authorizing Provider  ALPRAZolam (XANAX) 0.25 MG tablet Take 0.25 mg by mouth 2 (two) times daily as needed for anxiety.    Yes [provider]  Cholecalciferol (VITAMIN D3) 400 units CAPS Take 1 capsule by mouth daily. 06/22/17  Yes [provider]  hydrochlorothiazide (MICROZIDE) 12.5 MG capsule Take one tablet (12.5mg ) by mouth on Monday and Thursday only. 10/28/19  Yes Netta Neat., NP  metoprolol tartrate (LOPRESSOR) 25 MG tablet Take 12.5 mg by mouth 2 (two) times daily.  07/24/17  Yes [provider]  Multiple Vitamin (MULTIVITAMIN) tablet Take 1 tablet by mouth daily.   Yes [provider]  omeprazole (PRILOSEC) 20 MG capsule Take 20 mg by mouth daily. 03/20/20  Yes [provider]  potassium chloride SA (K-DUR,KLOR-CON) 20 MEQ tablet Take  0.5 tablets by mouth daily.  11/19/14  Yes [provider]    Allergies    Penicillins  Review of Systems   Review of Systems  Constitutional: Negative for chills and fever.  Respiratory: Negative for shortness of breath.   Cardiovascular: Negative for chest pain.  Gastrointestinal: Positive for nausea. Negative for vomiting.  Genitourinary: Negative for decreased urine volume, difficulty urinating and dysuria.  Musculoskeletal: Negative for back pain and neck pain.  Skin: Negative for rash and wound.  Allergic/Immunologic: Negative for immunocompromised state.  Neurological: Positive for headaches. Negative for weakness.  Psychiatric/Behavioral: Negative for confusion.  All other systems reviewed and are negative.   Physical Exam Updated Vital Signs BP (!) 185/80 (BP Location: Right Arm)   Pulse 62   Temp 98.3 F (36.8 C) (Oral)   Resp 20   Ht 5\' 6"  (1.676 m)   Wt 57.2 kg    SpO2 99%   BMI 20.34 kg/m   Physical Exam Vitals and nursing note reviewed.  Constitutional:      General: She is not in acute distress.    Appearance: She is well-developed and well-nourished. She is not diaphoretic.  HENT:     Head: Normocephalic and atraumatic.     Mouth/Throat:     Mouth: Mucous membranes are moist.  Eyes:     Extraocular Movements: Extraocular movements intact.     Pupils: Pupils are equal, round, and reactive to light.  Cardiovascular:     Rate and Rhythm: Normal rate and regular rhythm.     Pulses: Normal pulses.     Heart sounds: Normal heart sounds.  Pulmonary:     Effort: Pulmonary effort is normal.     Breath sounds: Normal breath sounds.  Abdominal:     Palpations: Abdomen is soft.     Tenderness: There is no abdominal tenderness.  Musculoskeletal:     Right lower leg: No edema.     Left lower leg: No edema.  Skin:    General: Skin is warm and dry.     Findings: No erythema or rash.  Neurological:     Mental Status: She is alert and oriented to person, place, and time.     Sensory: No sensory deficit.     Motor: No weakness.  Psychiatric:        Mood and Affect: Mood and affect normal.        Behavior: Behavior normal.     ED Results / Procedures / Treatments   Labs (all labs ordered are listed, but only abnormal results are displayed) Labs Reviewed  COMPREHENSIVE METABOLIC PANEL - Abnormal; Notable for the following components:      Result Value   Potassium 3.3 (*)    Glucose, Bld 132 (*)    Calcium 8.6 (*)    Total Protein 6.4 (*)    Albumin 3.1 (*)    GFR, Estimated 56 (*)    All other components within normal limits  URINALYSIS, ROUTINE W REFLEX MICROSCOPIC - Abnormal; Notable for the following components:   Leukocytes,Ua TRACE (*)    Bacteria, UA RARE (*)    All other components within normal limits  CBC WITH DIFFERENTIAL/PLATELET    EKG None  Radiology CT Head Wo Contrast  Result Date: 04/08/2020 CLINICAL DATA:   Head injury.  Headache. EXAM: CT HEAD WITHOUT CONTRAST TECHNIQUE: Contiguous axial images were obtained from the base of the skull through the vertex without intravenous contrast. COMPARISON:  CT head report 05/21/2017 FINDINGS: Brain:  Mild atrophy.  Mild white matter hypodensity bilaterally. Negative for acute infarct, hemorrhage, mass. Vascular: Negative for hyperdense vessel Skull: Negative Sinuses/Orbits: Bilateral cataract extraction. Paranasal sinus clear Other: None IMPRESSION: No acute abnormality. Mild atrophy and mild white matter changes consistent with chronic microvascular ischemic Electronically Signed   By: Marlan Palau M.D.   On: 04/08/2020 15:17    Procedures Procedures   Medications Ordered in ED Medications  potassium chloride SA (KLOR-CON) CR tablet 40 mEq (has no administration in time range)    ED Course  I have reviewed the triage vital signs and the nursing notes.  Pertinent labs & imaging results that were available during my care of the patient were reviewed by me and considered in my medical decision making (see chart for details).  Clinical Course as of 04/08/20 1744  Thu Apr 08, 2020  2142 87 year old female brought in by family member for evaluation of intermittent pain in her head and elevated blood pressure.  Currently does not have any pain.  Fall few weeks ago.  Getting some screening labs and a head CT.  Likely discharge if no significant abnormalities. [MB]  1742 CT head unremarkable. UA without obvious infection. CMP with mild hypokalemia, will replenish with oral prior to dc. CBC unremarkable.  [LM]    Clinical Course User Index [LM] Jeannie Fend, PA-C [MB] Terrilee Files, MD   MDM Rules/Calculators/A&P                          Final Clinical Impression(s) / ED Diagnoses Final diagnoses:  Hypokalemia  Elevated blood pressure reading  Fall, initial encounter  Nonintractable episodic headache, unspecified headache type    Rx / DC  Orders ED Discharge Orders    None       Alden Hipp 04/08/20 1744    Terrilee Files, MD 04/08/20 2047

## 2020-04-12 DIAGNOSIS — Z299 Encounter for prophylactic measures, unspecified: Secondary | ICD-10-CM | POA: Diagnosis not present

## 2020-04-12 DIAGNOSIS — Z789 Other specified health status: Secondary | ICD-10-CM | POA: Diagnosis not present

## 2020-04-12 DIAGNOSIS — I10 Essential (primary) hypertension: Secondary | ICD-10-CM | POA: Diagnosis not present

## 2020-04-16 DIAGNOSIS — R002 Palpitations: Secondary | ICD-10-CM | POA: Diagnosis not present

## 2020-04-20 DIAGNOSIS — R002 Palpitations: Secondary | ICD-10-CM | POA: Diagnosis not present

## 2020-04-23 DIAGNOSIS — R2689 Other abnormalities of gait and mobility: Secondary | ICD-10-CM | POA: Diagnosis not present

## 2020-04-26 NOTE — Progress Notes (Unsigned)
Cardiology Office Note  Date: 04/27/2020   ID: Dawn Estrada, Eagles Nov 26, 1919, MRN [id number]  PCP:  Dawn Peri, MD  Cardiologist:  No primary care provider on file. Electrophysiologist:  None   Chief Complaint: Palpitations  History of Present Illness: Dawn Estrada is a 87 y.o. female with a history of palpitations, HTN, anxiety, chest pain.  Prior event monitoring demonstrated sinus rhythm with PACs and PVCs and PSVT longest lasting 8 beats.,  Stated SVT may have been atrial tachycardia with variable block.  Last encounter with Dr. Purvis Estrada 04/18/2019 with telemedicine.  Having occasional chest pain and occasional left arm pain occurring once or twice a week.  States she took something for gas and symptoms resolved.  Has chronic leg weakness.  Denies any current palpitations.  She was taking metoprolol 12.5 mg p.o. twice daily.  Blood pressure was normal and no changes to therapy.  Was taking Xanax for anxiety and was symptomatically stable.  Chest thought to be a GI issue resolved with anti-gas medications.  She is here for 39-month follow-up.  She denies any issues in the interim since last visit other than recent ED visit for intermittent pain in her head and elevated blood pressure.  She had a fall a few weeks prior.  Screening labs and CT were ordered.  CT of the head was unremarkable.  UA was negative.  CMP mild hypokalemia.  CBC was unremarkable.  She was started on oral potassium.  States she has some palpitations mostly at night.  Recently had a cardiac monitor placed by PCP.  Son states they do not have the results yet.  She has a follow-up tomorrow with Dr. Sherryll Estrada.  Otherwise she denies any anginal or exertional symptoms.  She states she has a hiatal hernia and has some occasional epigastric pain. Denies any obvious anginal symptoms.  Son states she was recently started on amlodipine for elevated blood pressure.  Blood pressure today 118/58.  She continues on a regimen of  amlodipine 2.5 mg daily.  Hydrochlorothiazide 12.5 mg on Mondays and Thursdays only.  Metoprolol 12.5 mg p.o. twice daily and potassium 10 mEq daily.    Past Medical History:  Diagnosis Date  . Anxiety   . Arthritis   . Hiatal hernia   . Hypertension   . Reflux     Past Surgical History:  Procedure Laterality Date  . ABDOMINAL HYSTERECTOMY    . ABDOMINAL SURGERY     resection  . APPENDECTOMY    . HEMORROIDECTOMY    . TONSILLECTOMY      Current Outpatient Medications  Medication Sig Dispense Refill  . ALPRAZolam (XANAX) 0.25 MG tablet Take 0.25 mg by mouth 2 (two) times daily as needed for anxiety.     Marland Kitchen amLODipine (NORVASC) 2.5 MG tablet Take 2.5 mg by mouth daily.    . Cholecalciferol (VITAMIN D3) 400 units CAPS Take 1 capsule by mouth daily.  7  . hydrochlorothiazide (MICROZIDE) 12.5 MG capsule Take one tablet (12.5mg ) by mouth on Monday and Thursday only.    . metoprolol tartrate (LOPRESSOR) 25 MG tablet Take 12.5 mg by mouth 2 (two) times daily.   1  . Multiple Vitamin (MULTIVITAMIN) tablet Take 1 tablet by mouth daily.    Marland Kitchen omeprazole (PRILOSEC) 20 MG capsule Take 20 mg by mouth daily.    . potassium chloride SA (K-DUR,KLOR-CON) 20 MEQ tablet Take 0.5 tablets by mouth daily.     Marland Kitchen VYZULTA 0.024 % SOLN Apply 1 drop  to eye at bedtime.     No current facility-administered medications for this visit.   Allergies:  Penicillins   Social History: The patient  reports that she has never smoked. She has never used smokeless tobacco. She reports that she does not drink alcohol and does not use drugs.   Family History: The patient's family history includes Arthritis in her mother; Cancer in her brother; Diabetes in her sister; Hypertension in her sister; Myasthenia gravis in her sister.   ROS:  Please see the history of present illness. Otherwise, complete review of systems is positive for none.  All other systems are reviewed and negative.   Physical Exam: VS:  BP (!) 118/58    Pulse 72   Ht 5\' 6"  (1.676 m)   Wt 128 lb (58.1 kg)   SpO2 97%   BMI 20.66 kg/m , BMI Body mass index is 20.66 kg/m.  Wt Readings from Last 3 Encounters:  04/27/20 128 lb (58.1 kg)  04/08/20 126 lb (57.2 kg)  10/28/19 130 lb (59 kg)    General: Patient appears comfortable at rest. Neck: Supple, no elevated JVP or carotid bruits, no thyromegaly. Lungs: Clear to auscultation, nonlabored breathing at rest. Cardiac: Regular rate and rhythm, no S3 or significant systolic murmur, no pericardial rub. Extremities: Mild non-pitting edema, distal pulses 2+. Skin: Warm and dry. Musculoskeletal: No kyphosis. Neuropsychiatric: Alert and oriented x3, affect grossly appropriate.  ECG:  An ECG dated 10/28/2019 was personally reviewed today and demonstrated:  Normal sinus rhythm, left axis deviation rate of 67, possible anterior lateral infarct, age undetermined.  Recent Labwork: 04/08/2020: ALT 13; AST 20; BUN 8; Creatinine, Ser 0.92; Hemoglobin 12.1; Platelets 158; Potassium 3.3; Sodium 141  No results found for: CHOL, TRIG, HDL, CHOLHDL, VLDL, LDLCALC, LDLDIRECT  Other Studies Reviewed Today:  Right lower extremity venous Doppler ultrasound 06/24/2019 CLINICAL DATA: 87 year old female with right calf pain and  swelling.    EXAM:  RIGHT LOWER EXTREMITY VENOUS DOPPLER ULTRASOUND    TECHNIQUE:  Gray-scale sonography with compression, as well as color and duplex  ultrasound, were performed to evaluate the deep venous system(s)  from the level of the common femoral vein through the popliteal and  proximal calf veins.    COMPARISON: None.    FINDINGS:  VENOUS    Normal compressibility of the common femoral, superficial femoral,  and popliteal veins, as well as the visualized calf veins.  Visualized portions of profunda femoral vein and great saphenous  vein unremarkable. No filling defects to suggest DVT on grayscale or  color Doppler imaging. Doppler waveforms  show normal direction of  venous flow, normal respiratory plasticity and response to  augmentation.    Limited views of the contralateral common femoral vein are  Unremarkable.   Neurology 09/09/2019 This is a 87 year old female referred for evaluation of bilateral hand numbness and tingling.  NCV & EMG Findings: Extensive electrodiagnostic testing of the right upper extremity and additional studies of the left shows:  1. Bilateral median sensory responses are absent. Bilateral ulnar sensory responses are within normal limits. 2. Right median motor response shows severely prolonged latency (8.0 ms) and reduced amplitude (3.5 mV). Left median motor response shows prolonged distal onset latency (5.9 ms). Left ulnar motor response shows slowed conduction velocity across the elbow (A Elbow-B Elbow, 40 m/s). Right ulnar motor responses within normal limits.  3. Chronic motor axonal loss changes are seen affecting bilateral abductor pollicis brevis muscles and the left first dorsal interosseous muscles. There  is no evidence of accompanied active denervation   Impression: 1. Bilateral median neuropathy at or distal to the wrist, consistent with a clinical diagnosis of carpal tunnel syndrome.  Overall, these findings are severe in degree electrically, and worse on the right. 2. Left ulnar neuropathy with slowing across the elbow, mild.   Echocardiogram 11/26/2014  Study Conclusions   - Left ventricle: The cavity size was normal. Wall thickness was  increased in a pattern of mild LVH. Systolic function was  vigorous. The estimated ejection fraction was in the range of 65%  to 70%. Wall motion was normal; there were no regional wall  motion abnormalities. Doppler parameters are consistent with  abnormal left ventricular relaxation (grade 1 diastolic  dysfunction). Doppler parameters are consistent with high  ventricular filling pressure.     Assessment and  Plan:  1. Palpitations   2. Essential hypertension   3. Chest pain, unspecified type   4. Lower extremity edema     1. Palpitations States she is recently had some palpitations and Dr. Sherryll Estrada ordered a cardiac monitor.  Son states the results are pending.  Continue metoprolol 12.5 mg p.o. twice daily.  2. Essential hypertension Blood pressure today 118/58.  Son states she was recently placed on amlodipine 2.5 mg and blood pressure has been better but sometimes elevated at home.  Continue metoprolol 12.5 mg p.o. twice daily.  Continue amlodipine 2.5 mg daily.  Continue HCTZ 12.5 mg on Mondays and Thursdays only.  Continue potassium supplementation 10 mEq daily.  3. Chest pain, unspecified type Continues with mild intermittent resting chest pain which is transient in nature without radiation or associated symptoms.  Patient states symptoms improve if she sometimes belches.  She states she has a hiatal hernia.  She has a history of GERD and takes omeprazole.  4.  Lower extremity edema Has some mild nonpitting lower extremity edema.  Continue HCTZ 12.5 mg p.o. twice weekly on Mondays and Thursdays.  Continue potassium chloride 10 mEq daily.    Medication Adjustments/Labs and Tests Ordered: Current medicines are reviewed at length with the patient today.  Concerns regarding medicines are outlined above.   Disposition: Follow-up with Dr. Wyline Mood or APP 6 months  Signed, Rennis Harding, NP 04/27/2020 1:34 PM    [facility] at [facility] [address] [place], [place], [place] [address] Phone: [phone]; Fax: [phone]

## 2020-04-27 ENCOUNTER — Encounter: Payer: Self-pay | Admitting: Family Medicine

## 2020-04-27 ENCOUNTER — Ambulatory Visit: Payer: Medicare Other | Admitting: Family Medicine

## 2020-04-27 VITALS — BP 118/58 | HR 72 | Ht 66.0 in | Wt 128.0 lb

## 2020-04-27 DIAGNOSIS — R002 Palpitations: Secondary | ICD-10-CM | POA: Diagnosis not present

## 2020-04-27 DIAGNOSIS — I10 Essential (primary) hypertension: Secondary | ICD-10-CM

## 2020-04-27 DIAGNOSIS — R600 Localized edema: Secondary | ICD-10-CM

## 2020-04-27 DIAGNOSIS — R079 Chest pain, unspecified: Secondary | ICD-10-CM | POA: Diagnosis not present

## 2020-04-27 NOTE — Patient Instructions (Signed)
Your physician recommends that you schedule a follow-up appointment in: 6 MONTHS WITH MD  Your physician recommends that you continue on your current medications as directed. Please refer to the Current Medication list given to you today.  Thank you for choosing  [facility]!!      

## 2020-04-28 DIAGNOSIS — Z789 Other specified health status: Secondary | ICD-10-CM | POA: Diagnosis not present

## 2020-04-28 DIAGNOSIS — I10 Essential (primary) hypertension: Secondary | ICD-10-CM | POA: Diagnosis not present

## 2020-04-28 DIAGNOSIS — Z681 Body mass index (BMI) 19 or less, adult: Secondary | ICD-10-CM | POA: Diagnosis not present

## 2020-04-28 DIAGNOSIS — I471 Supraventricular tachycardia: Secondary | ICD-10-CM | POA: Diagnosis not present

## 2020-04-28 DIAGNOSIS — N183 Chronic kidney disease, stage 3 unspecified: Secondary | ICD-10-CM | POA: Diagnosis not present

## 2020-04-28 DIAGNOSIS — M549 Dorsalgia, unspecified: Secondary | ICD-10-CM | POA: Diagnosis not present

## 2020-04-28 DIAGNOSIS — Z299 Encounter for prophylactic measures, unspecified: Secondary | ICD-10-CM | POA: Diagnosis not present

## 2020-04-30 DIAGNOSIS — I10 Essential (primary) hypertension: Secondary | ICD-10-CM | POA: Diagnosis not present

## 2020-05-05 DIAGNOSIS — M4800 Spinal stenosis, site unspecified: Secondary | ICD-10-CM | POA: Diagnosis not present

## 2020-05-05 DIAGNOSIS — E559 Vitamin D deficiency, unspecified: Secondary | ICD-10-CM | POA: Diagnosis not present

## 2020-05-05 DIAGNOSIS — K219 Gastro-esophageal reflux disease without esophagitis: Secondary | ICD-10-CM | POA: Diagnosis not present

## 2020-05-06 DIAGNOSIS — I10 Essential (primary) hypertension: Secondary | ICD-10-CM | POA: Diagnosis not present

## 2020-05-10 DIAGNOSIS — I471 Supraventricular tachycardia: Secondary | ICD-10-CM | POA: Diagnosis not present

## 2020-05-10 DIAGNOSIS — N183 Chronic kidney disease, stage 3 unspecified: Secondary | ICD-10-CM | POA: Diagnosis not present

## 2020-05-10 DIAGNOSIS — Z299 Encounter for prophylactic measures, unspecified: Secondary | ICD-10-CM | POA: Diagnosis not present

## 2020-05-10 DIAGNOSIS — I10 Essential (primary) hypertension: Secondary | ICD-10-CM | POA: Diagnosis not present

## 2020-05-18 ENCOUNTER — Emergency Department (HOSPITAL_COMMUNITY): Payer: Medicare Other

## 2020-05-18 ENCOUNTER — Other Ambulatory Visit: Payer: Self-pay

## 2020-05-18 ENCOUNTER — Encounter (HOSPITAL_COMMUNITY): Payer: Self-pay | Admitting: *Deleted

## 2020-05-18 ENCOUNTER — Emergency Department (HOSPITAL_COMMUNITY)
Admission: EM | Admit: 2020-05-18 | Discharge: 2020-05-18 | Disposition: A | Discharge status: Home / Self Care | Payer: Medicare Other | Attending: Emergency Medicine | Admitting: Emergency Medicine

## 2020-05-18 DIAGNOSIS — K529 Noninfective gastroenteritis and colitis, unspecified: Secondary | ICD-10-CM | POA: Diagnosis not present

## 2020-05-18 DIAGNOSIS — K449 Diaphragmatic hernia without obstruction or gangrene: Secondary | ICD-10-CM | POA: Diagnosis not present

## 2020-05-18 DIAGNOSIS — R109 Unspecified abdominal pain: Secondary | ICD-10-CM | POA: Diagnosis not present

## 2020-05-18 DIAGNOSIS — Z79899 Other long term (current) drug therapy: Secondary | ICD-10-CM | POA: Diagnosis not present

## 2020-05-18 DIAGNOSIS — J9811 Atelectasis: Secondary | ICD-10-CM | POA: Diagnosis not present

## 2020-05-18 DIAGNOSIS — I10 Essential (primary) hypertension: Secondary | ICD-10-CM | POA: Diagnosis not present

## 2020-05-18 DIAGNOSIS — R1110 Vomiting, unspecified: Secondary | ICD-10-CM | POA: Diagnosis not present

## 2020-05-18 DIAGNOSIS — I444 Left anterior fascicular block: Secondary | ICD-10-CM | POA: Diagnosis not present

## 2020-05-18 LAB — CBC WITH DIFFERENTIAL/PLATELET
Abs Immature Granulocytes: 0.02 10*3/uL (ref 0.00–0.07)
Basophils Absolute: 0 10*3/uL (ref 0.0–0.1)
Basophils Relative: 1 %
Eosinophils Absolute: 0 10*3/uL (ref 0.0–0.5)
Eosinophils Relative: 0 %
HCT: 38.1 % (ref 36.0–46.0)
Hemoglobin: 12.4 g/dL (ref 12.0–15.0)
Immature Granulocytes: 0 %
Lymphocytes Relative: 17 %
Lymphs Abs: 1.1 10*3/uL (ref 0.7–4.0)
MCH: 29.7 pg (ref 26.0–34.0)
MCHC: 32.5 g/dL (ref 30.0–36.0)
MCV: 91.4 fL (ref 80.0–100.0)
Monocytes Absolute: 0.4 10*3/uL (ref 0.1–1.0)
Monocytes Relative: 6 %
Neutro Abs: 4.9 10*3/uL (ref 1.7–7.7)
Neutrophils Relative %: 76 %
Platelets: 194 10*3/uL (ref 150–400)
RBC: 4.17 MIL/uL (ref 3.87–5.11)
RDW: 14 % (ref 11.5–15.5)
WBC: 6.5 10*3/uL (ref 4.0–10.5)
nRBC: 0 % (ref 0.0–0.2)

## 2020-05-18 LAB — COMPREHENSIVE METABOLIC PANEL
ALT: 14 U/L (ref 0–44)
AST: 23 U/L (ref 15–41)
Albumin: 3.3 g/dL — ABNORMAL LOW (ref 3.5–5.0)
Alkaline Phosphatase: 61 U/L (ref 38–126)
Anion gap: 11 (ref 5–15)
BUN: 9 mg/dL (ref 8–23)
CO2: 23 mmol/L (ref 22–32)
Calcium: 8.8 mg/dL — ABNORMAL LOW (ref 8.9–10.3)
Chloride: 106 mmol/L (ref 98–111)
Creatinine, Ser: 0.97 mg/dL (ref 0.44–1.00)
GFR, Estimated: 52 mL/min — ABNORMAL LOW (ref >60)
Glucose, Bld: 146 mg/dL — ABNORMAL HIGH (ref 70–99)
Potassium: 3.4 mmol/L — ABNORMAL LOW (ref 3.5–5.1)
Sodium: 140 mmol/L (ref 135–145)
Total Bilirubin: 0.9 mg/dL (ref 0.3–1.2)
Total Protein: 6.8 g/dL (ref 6.5–8.1)

## 2020-05-18 LAB — URINALYSIS, ROUTINE W REFLEX MICROSCOPIC
Bacteria, UA: NONE SEEN
Bilirubin Urine: NEGATIVE
Glucose, UA: NEGATIVE mg/dL
Hgb urine dipstick: NEGATIVE
Ketones, ur: NEGATIVE mg/dL
Nitrite: NEGATIVE
Protein, ur: NEGATIVE mg/dL
Specific Gravity, Urine: 1.023 (ref 1.005–1.030)
pH: 6 (ref 5.0–8.0)

## 2020-05-18 LAB — LIPASE, BLOOD: Lipase: 42 U/L (ref 11–51)

## 2020-05-18 LAB — TROPONIN I (HIGH SENSITIVITY)
Troponin I (High Sensitivity): 14 ng/L (ref <18)
Troponin I (High Sensitivity): 21 ng/L — ABNORMAL HIGH (ref <18)
Troponin I (High Sensitivity): 24 ng/L — ABNORMAL HIGH (ref <18)

## 2020-05-18 LAB — LACTIC ACID, PLASMA: Lactic Acid, Venous: 1.2 mmol/L (ref 0.5–1.9)

## 2020-05-18 MED ORDER — ONDANSETRON HCL 4 MG/2ML IJ SOLN
4.0000 mg | Freq: Once | INTRAMUSCULAR | Status: AC
  Administered 2020-05-18: 4 mg via INTRAVENOUS
  Filled 2020-05-18: qty 2

## 2020-05-18 MED ORDER — SODIUM CHLORIDE 0.9 % IV BOLUS
500.0000 mL | Freq: Once | INTRAVENOUS | Status: AC
  Administered 2020-05-18: 500 mL via INTRAVENOUS

## 2020-05-18 MED ORDER — IOHEXOL 300 MG/ML  SOLN
100.0000 mL | Freq: Once | INTRAMUSCULAR | Status: AC | PRN
  Administered 2020-05-18: 100 mL via INTRAVENOUS

## 2020-05-18 MED ORDER — ONDANSETRON 4 MG PO TBDP: ORAL_TABLET | ORAL | 0 refills | Status: DC

## 2020-05-18 MED ORDER — PANTOPRAZOLE SODIUM 40 MG IV SOLR
40.0000 mg | Freq: Once | INTRAVENOUS | Status: AC
  Administered 2020-05-18: 40 mg via INTRAVENOUS
  Filled 2020-05-18: qty 40

## 2020-05-18 NOTE — ED Notes (Signed)
No vomiting or diarrhea noted this shift.

## 2020-05-18 NOTE — Discharge Instructions (Addendum)
Follow-up in the next 2 to 3 days with the gastroenterologist Dr. Levon Hedger.  Or you can see one of his partners.  Return to the emergency department sooner if any problems.

## 2020-05-18 NOTE — ED Triage Notes (Signed)
Pt c/o vomiting and epigastric pain that started in the middle of the night. Son reports she has a hernia and states, "that might be what's bothering her". Denies diarrhea.

## 2020-05-18 NOTE — ED Provider Notes (Signed)
[facility] EMERGENCY DEPARTMENT Provider Note   CSN: [id number] Arrival date & time: 05/18/20  6546     History Chief Complaint  Patient presents with  . Emesis    Dawn Estrada is a 87 y.o. female.  Patient complains of vomiting which started today.  Occasional chest pain.  The history is provided by the patient and medical records. No language interpreter was used.  Emesis Severity:  Moderate Timing:  Intermittent Quality:  Stomach contents Able to tolerate:  Solids Progression:  Worsening Recent urination:  Normal Relieved by:  Nothing Worsened by:  Nothing Associated symptoms: no abdominal pain, no cough, no diarrhea and no headaches        Past Medical History:  Diagnosis Date  . Anxiety   . Arthritis   . Hiatal hernia   . Hypertension   . Reflux     Patient Active Problem List   Diagnosis Date Noted  . Hypertension   . CAP (community acquired pneumonia) 11/25/2014  . Generalized weakness 11/25/2014  . Hypokalemia 11/25/2014  . Elevated troponin 11/25/2014  . Chest pain 11/25/2014  . Leukocytosis 11/25/2014  . Gastroenteritis 11/25/2014  . Sinus tachycardia 11/25/2014    Past Surgical History:  Procedure Laterality Date  . ABDOMINAL HYSTERECTOMY    . ABDOMINAL SURGERY     resection  . APPENDECTOMY    . HEMORROIDECTOMY    . TONSILLECTOMY       OB History    Gravida  3   Para  3   Term  3   Preterm      AB      Living        SAB      IAB      Ectopic      Multiple      Live Births              Family History  Problem Relation Age of Onset  . Arthritis Mother   . Myasthenia gravis Sister   . Cancer Brother   . Hypertension Sister   . Diabetes Sister     Social History   Tobacco Use  . Smoking status: Never Smoker  . Smokeless tobacco: Never Used  Vaping Use  . Vaping Use: Never used  Substance Use Topics  . Alcohol use: No  . Drug use: Never    Home Medications Prior to Admission medications    Medication Sig Start Date End Date Taking? Authorizing Provider  ALPRAZolam (XANAX) 0.25 MG tablet Take 0.25 mg by mouth 2 (two) times daily as needed for anxiety.    Yes [provider]  amLODipine (NORVASC) 2.5 MG tablet Take 2.5 mg by mouth daily. 04/09/20  Yes [provider]  Cholecalciferol (VITAMIN D3) 400 units CAPS Take 1 capsule by mouth daily. 06/22/17  Yes [provider]  hydrochlorothiazide (MICROZIDE) 12.5 MG capsule Take one tablet (12.5mg ) by mouth on Monday and Thursday only. 10/28/19  Yes Netta Neat., NP  metoprolol tartrate (LOPRESSOR) 25 MG tablet Take 12.5 mg by mouth 2 (two) times daily.  07/24/17  Yes [provider]  Multiple Vitamin (MULTIVITAMIN) tablet Take 1 tablet by mouth daily.   Yes [provider]  omeprazole (PRILOSEC) 20 MG capsule Take 20 mg by mouth daily. 03/20/20  Yes [provider]  ondansetron (ZOFRAN ODT) 4 MG disintegrating tablet 4mg  ODT q4 hours prn nausea/vomit 05/18/20  Yes 07/18/20, MD  potassium chloride SA (K-DUR,KLOR-CON) 20 MEQ tablet Take  20 mEq by mouth daily. 11/19/14  Yes [provider]  VYZULTA 0.024 % SOLN Apply 1 drop to eye at bedtime. 04/12/20  Yes [provider]    Allergies    Penicillins  Review of Systems   Review of Systems  Constitutional: Negative for appetite change and fatigue.  HENT: Negative for congestion, ear discharge and sinus pressure.   Eyes: Negative for discharge.  Respiratory: Negative for cough.   Cardiovascular: Positive for chest pain.  Gastrointestinal: Positive for vomiting. Negative for abdominal pain and diarrhea.  Genitourinary: Negative for frequency and hematuria.  Musculoskeletal: Negative for back pain.  Skin: Negative for rash.  Neurological: Negative for seizures and headaches.  Psychiatric/Behavioral: Negative for hallucinations.    Physical Exam Updated Vital Signs BP 137/63   Pulse 75   Temp 98.8 F  (37.1 C) (Oral)   Resp (!) 25   Ht 5\' 6"  (1.676 m)   Wt 58.1 kg   SpO2 99%   BMI 20.66 kg/m   Physical Exam Vitals and nursing note reviewed.  Constitutional:      Appearance: She is well-developed.  HENT:     Head: Normocephalic.     Nose: Nose normal.  Eyes:     General: No scleral icterus.    Conjunctiva/sclera: Conjunctivae normal.  Neck:     Thyroid: No thyromegaly.  Cardiovascular:     Rate and Rhythm: Normal rate and regular rhythm.     Heart sounds: No murmur heard. No friction rub. No gallop.   Pulmonary:     Breath sounds: No stridor. No wheezing or rales.  Chest:     Chest wall: No tenderness.  Abdominal:     General: There is no distension.     Tenderness: There is abdominal tenderness. There is no rebound.  Musculoskeletal:        General: Normal range of motion.     Cervical back: Neck supple.  Lymphadenopathy:     Cervical: No cervical adenopathy.  Skin:    Findings: No erythema or rash.  Neurological:     Mental Status: She is oriented to person, place, and time.     Motor: No abnormal muscle tone.     Coordination: Coordination normal.  Psychiatric:        Behavior: Behavior normal.     ED Results / Procedures / Treatments   Labs (all labs ordered are listed, but only abnormal results are displayed) Labs Reviewed  COMPREHENSIVE METABOLIC PANEL - Abnormal; Notable for the following components:      Result Value   Potassium 3.4 (*)    Glucose, Bld 146 (*)    Calcium 8.8 (*)    Albumin 3.3 (*)    GFR, Estimated 52 (*)    All other components within normal limits  URINALYSIS, ROUTINE W REFLEX MICROSCOPIC - Abnormal; Notable for the following components:   Color, Urine STRAW (*)    Leukocytes,Ua SMALL (*)    All other components within normal limits  TROPONIN I (HIGH SENSITIVITY) - Abnormal; Notable for the following components:   Troponin I (High Sensitivity) 21 (*)    All other components within normal limits  TROPONIN I (HIGH  SENSITIVITY) - Abnormal; Notable for the following components:   Troponin I (High Sensitivity) 24 (*)    All other components within normal limits  CBC WITH DIFFERENTIAL/PLATELET  LIPASE, BLOOD  LACTIC ACID, PLASMA  TROPONIN I (HIGH SENSITIVITY)  TROPONIN I (HIGH SENSITIVITY)    EKG None  Radiology  CT ABDOMEN PELVIS W CONTRAST  Result Date: 05/18/2020 CLINICAL DATA:  Upper abdominal pain, emesis since last night EXAM: CT ABDOMEN AND PELVIS WITH CONTRAST TECHNIQUE: Multidetector CT imaging of the abdomen and pelvis was performed using the standard protocol following bolus administration of intravenous contrast. CONTRAST:  OMNIPAQUE IOHEXOL 300 MG/ML  SOLN COMPARISON:  06/09/2019 FINDINGS: Lower chest: Bibasilar scarring versus atelectasis. Normal heart size. No pericardial or pleural effusion. Moderately large hiatal hernia as before. Hepatobiliary: Similar scattered hypodense hepatic cysts. No biliary dilatation. Remote cholecystectomy. Stable extrahepatic biliary prominence, suspect postcholecystectomy related. Hepatic and portal veins are patent. Pancreas: Unremarkable. No pancreatic ductal dilatation or surrounding inflammatory changes. Spleen: Normal in size without focal abnormality. Adrenals/Urinary Tract: Normal adrenal glands. Stable bilateral renal cortical and parapelvic cysts. No renal obstruction or hydronephrosis. No new renal abnormality. Ureters are symmetric and decompressed. No hydroureter or ureteral calculus. Bladder unremarkable. Stomach/Bowel: Mildly dilated loops of small bowel in the left abdomen with a few scattered air-fluid levels. Fluid-filled cecum as well. Findings remain nonspecific for mild enteritis. No obstruction pattern, significant ileus, free air, or pneumatosis. Appendix not visualized. No definite acute inflammatory process in the right lower quadrant. No fluid collection, hemorrhage, hematoma, or abscess. Trace ascites over the liver beneath the  hemidiaphragm and dependently within the pelvis. Vascular/Lymphatic: Aortoiliac atherosclerosis without aneurysm or occlusive process. No acute vascular finding or dissection. No retroperitoneal hemorrhage or hematoma. Mesenteric and renal vasculature appear to remain patent. No veno-occlusive process. No bulky adenopathy. Reproductive: Previous hysterectomy. Trace pelvic free fluid. No adnexal abnormality or mass. Other: Negative for hernia. Musculoskeletal: Degenerative changes.  No acute osseous finding. IMPRESSION: Mild fluid distended small bowel in the left abdomen as well as a fluid distended cecum with scattered air-fluid levels can be seen with mild enteritis. No obstruction pattern. Negative for free air or abscess. Stable moderately large hiatal hernia Benign hepatic and renal cysts as before. Aortic Atherosclerosis (ICD10-I70.0). Electronically Signed   By: Judie Petit.  Shick M.D.   On: 05/18/2020 10:18   DG Chest Port 1 View  Result Date: 05/18/2020 CLINICAL DATA:  Pain with vomiting EXAM: PORTABLE CHEST 1 VIEW COMPARISON:  Jun 09, 2019 FINDINGS: There is slight bibasilar atelectasis. No edema or airspace opacity. The heart size and pulmonary vascularity are normal. No adenopathy. There is a focal hiatal hernia. No evident bone lesions. IMPRESSION: Hiatal hernia. Slight bibasilar atelectasis. No edema or airspace opacity. Heart size normal. Electronically Signed   By: Bretta Bang III M.D.   On: 05/18/2020 08:14    Procedures Procedures   Medications Ordered in ED Medications  sodium chloride 0.9 % bolus 500 mL (0 mLs Intravenous Stopped 05/18/20 0852)  ondansetron (ZOFRAN) injection 4 mg (4 mg Intravenous Given 05/18/20 0806)  pantoprazole (PROTONIX) injection 40 mg (40 mg Intravenous Given 05/18/20 0807)  iohexol (OMNIPAQUE) 300 MG/ML solution 100 mL (100 mLs Intravenous Contrast Given 05/18/20 0950)    ED Course  I have reviewed the triage vital signs and the nursing notes.  Pertinent  labs & imaging results that were available during my care of the patient were reviewed by me and considered in my medical decision making (see chart for details). CRITICAL CARE Performed by: Bethann Berkshire Total critical care time: 45 minutes Critical care time was exclusive of separately billable procedures and treating other patients. Critical care was necessary to treat or prevent imminent or life-threatening deterioration. Critical care was time spent personally by me on the following activities: development of treatment plan with  patient and/or surrogate as well as nursing, discussions with consultants, evaluation of patient's response to treatment, examination of patient, obtaining history from patient or surrogate, ordering and performing treatments and interventions, ordering and review of laboratory studies, ordering and review of radiographic studies, pulse oximetry and re-evaluation of patient's condition. I spoke with cardiology Dr. Francee Piccolo and he felt like the patient could go home if the third troponin had not shown a significant increase from second.  Her third troponin went  from 21-24.  I spoke with the gastroenterologist Dr. Levon Hedger and he felt like the patient could go home if her lactic acid was normal.  They will follow up this week.  Patient's lactic acid was normal   MDM Rules/Calculators/A&P                          Vomiting with mild enteritis on CT scan.  Patient sent home with Zofran and will follow up with GI Final Clinical Impression(s) / ED Diagnoses Final diagnoses:  Gastroenteritis    Rx / DC Orders ED Discharge Orders         Ordered    ondansetron (ZOFRAN ODT) 4 MG disintegrating tablet        05/18/20 1511           Bethann Berkshire, MD 05/18/20 1516

## 2020-05-20 ENCOUNTER — Telehealth: Payer: Self-pay | Admitting: *Deleted

## 2020-05-20 NOTE — Telephone Encounter (Signed)
-----   Message from Antoine Poche, MD sent at 05/10/2020  3:36 PM EDT ----- Monitor from Dr Shirlee Limerick. Occasioanl extra heart beat and short episodes of an abnormal heart rhythm called SVT. None of these findings are dangerous and are similar to her prior monitor. If no symptoms of heart racing then continue current meds, if ongoing symptoms we could increase her lopressor some   Dominga Ferry MD

## 2020-05-20 NOTE — Telephone Encounter (Signed)
Pt states that she is still having episodes of racing heart. Please advise.

## 2020-05-24 NOTE — Telephone Encounter (Signed)
Called to notify pt of medication increase. Spoke with son who states that pt is already taking Lopressor 25 mg two times daily. Please advise.

## 2020-05-24 NOTE — Telephone Encounter (Signed)
Increase lopressor to 25mg  bid and update 1 week    [place] MD

## 2020-05-25 MED ORDER — METOPROLOL TARTRATE 25 MG PO TABS: 37.5000 mg | ORAL_TABLET | Freq: Two times a day (BID) | ORAL | 3 refills | Status: DC

## 2020-05-25 NOTE — Telephone Encounter (Signed)
Increase lopressor to 37.5mg bid  J Obrian Bulson MD 

## 2020-05-25 NOTE — Telephone Encounter (Signed)
Patients son verbalized understanding. New prescription sent to pharmacy and will update our office in one week on how patient is doing.

## 2020-05-26 ENCOUNTER — Ambulatory Visit (INDEPENDENT_AMBULATORY_CARE_PROVIDER_SITE_OTHER): Payer: Medicare Other | Admitting: Gastroenterology

## 2020-05-26 ENCOUNTER — Encounter (INDEPENDENT_AMBULATORY_CARE_PROVIDER_SITE_OTHER): Payer: Self-pay | Admitting: Gastroenterology

## 2020-05-26 ENCOUNTER — Other Ambulatory Visit: Payer: Self-pay

## 2020-05-26 VITALS — BP 154/76 | HR 70 | Temp 97.9°F | Ht 66.0 in | Wt 127.0 lb

## 2020-05-26 DIAGNOSIS — N183 Chronic kidney disease, stage 3 unspecified: Secondary | ICD-10-CM | POA: Diagnosis not present

## 2020-05-26 DIAGNOSIS — K219 Gastro-esophageal reflux disease without esophagitis: Secondary | ICD-10-CM | POA: Insufficient documentation

## 2020-05-26 DIAGNOSIS — K529 Noninfective gastroenteritis and colitis, unspecified: Secondary | ICD-10-CM

## 2020-05-26 DIAGNOSIS — I471 Supraventricular tachycardia: Secondary | ICD-10-CM | POA: Diagnosis not present

## 2020-05-26 DIAGNOSIS — Z299 Encounter for prophylactic measures, unspecified: Secondary | ICD-10-CM | POA: Diagnosis not present

## 2020-05-26 DIAGNOSIS — I10 Essential (primary) hypertension: Secondary | ICD-10-CM | POA: Diagnosis not present

## 2020-05-26 MED ORDER — ONDANSETRON HCL 4 MG PO TABS: 4.0000 mg | ORAL_TABLET | Freq: Two times a day (BID) | ORAL | 1 refills | Status: DC | PRN

## 2020-05-26 NOTE — Patient Instructions (Addendum)
Agree with increasing omeprazole to 40 mg every day Can take Zofran as needed for nausea episodes

## 2020-05-26 NOTE — Progress Notes (Signed)
Dawn Estrada, M.D. Gastroenterology & Hepatology [facility] [address] [place], [place] [address] Primary Care Physician: Kirstie Peri, MD [address] [place] [address]  Referring MD: PCP  Chief Complaint: Abdominal pain and vomiting  History of Present Illness: Dawn Estrada is a 87 y.o. female with past medical history of hiatal hernia, hypertension, anxiety and arthritis, who presents for evaluation of abdominal pain and vomiting.  Patient was seen in the ER at [facility] on 05/18/2020 after presenting persistent new onset vomiting and some chest pain the day of her visit to the ER.  The patient and her companion report that she presented new onset of symptoms characterized by bilious vomiting along with some chest discomfort and epigastric pain.  Patient had work-up performed at the ER which showed potassium of 3.4 with rest of the electrolytes and renal function within normal limits, her liver enzymes were also unremarkable with AST 23, ALT 14, total bilirubin 0.9, alkaline phosphatase 61, lipase 42, CBC was also within normal limits with white cell count 6.5, hemoglobin 12.4 and platelets 194.  CT abdomen and pelvis with IV contrast was performed which showed mild fluid distention of the small bowel in the left abdomen and also in the cecum with some air-fluid levels but no presence of findings concerning for bowel obstruction, possibly related to enteritis.  There were also some small liver cysts.  Patient was discharged home on Zofran but she has not been taking his medication frequently.  Patient reports that for the last year she has felt some discomfort in her mid chest, along with the sensation of an "abnormal heartbeat" but denies having any palpitations. She reports that since the last time she went to the hospital she has only vomited once. Has also presented discomfort on her upper abdomen for the last year.   States she has been tolerating diet adequately and denies having any frequent nausea symptoms.  Due to the persistence of chest discomfort, she saw her PCP today and had her Protonix increased to 40 mg.   The patient denies having fever, chills, hematochezia, melena, hematemesis, abdominal distention, diarrhea, jaundice, pruritus or weight loss.  FHx: neg for any gastrointestinal/liver disease, brother prostate cancer Social: neg smoking, alcohol or illicit drug use  Past Medical History: Past Medical History:  Diagnosis Date  . Anxiety   . Arthritis   . Hiatal hernia   . Hypertension   . Reflux     Past Surgical History: Past Surgical History:  Procedure Laterality Date  . ABDOMINAL HYSTERECTOMY    . ABDOMINAL SURGERY     resection  . APPENDECTOMY    . HEMORROIDECTOMY    . TONSILLECTOMY      Family History: Family History  Problem Relation Age of Onset  . Arthritis Mother   . Myasthenia gravis Sister   . Cancer Brother   . Hypertension Sister   . Diabetes Sister     Social History: Social History   Tobacco Use  Smoking Status Never Smoker  Smokeless Tobacco Never Used   Social History   Substance and Sexual Activity  Alcohol Use No   Social History   Substance and Sexual Activity  Drug Use Never    Allergies: Allergies  Allergen Reactions  . Penicillins Swelling    Has patient had a PCN reaction causing immediate rash, facial/tongue/throat swelling, SOB or lightheadedness with hypotension: NO Has patient had a PCN reaction causing severe rash involving mucus membranes  or skin necrosis: no Has patient had a PCN reaction that required hospitalization : no Has patient had a PCN reaction occurring within the last 10 years: no If all of the above answers are "NO", then may proceed with Cephalosporin use.    Medications: Current Outpatient Medications  Medication Sig Dispense Refill  . ALPRAZolam (XANAX) 0.25 MG tablet Take 0.25 mg by mouth 2 (two)  times daily as needed for anxiety.     Marland Kitchen amLODipine (NORVASC) 2.5 MG tablet Take 2.5 mg by mouth daily.    . Cholecalciferol (VITAMIN D3) 400 units CAPS Take 1 capsule by mouth daily.  7  . hydrochlorothiazide (MICROZIDE) 12.5 MG capsule Take one tablet (12.5mg ) by mouth on Monday and Thursday only.    . metoprolol tartrate (LOPRESSOR) 25 MG tablet Take 1.5 tablets (37.5 mg total) by mouth 2 (two) times daily. 270 tablet 3  . Multiple Vitamin (MULTIVITAMIN) tablet Take 1 tablet by mouth daily.    Marland Kitchen omeprazole (PRILOSEC) 20 MG capsule Take 40 mg by mouth daily.    . ondansetron (ZOFRAN ODT) 4 MG disintegrating tablet 4mg  ODT q4 hours prn nausea/vomit 12 tablet 0  . potassium chloride SA (K-DUR,KLOR-CON) 20 MEQ tablet Take 20 mEq by mouth daily.    VYZULTA 0.024 % SOLN Apply 1 drop to eye at bedtime.     No current facility-administered medications for this visit.    Review of Systems: GENERAL: negative for malaise, night sweats HEENT: No changes in hearing or vision, no nose bleeds or other nasal problems. NECK: Negative for lumps, goiter, pain and significant neck swelling RESPIRATORY: Negative for cough, wheezing CARDIOVASCULAR: Negative for chest pain, leg swelling, palpitations, orthopnea GI: SEE HPI MUSCULOSKELETAL: Negative for joint pain or swelling, back pain, and muscle pain. SKIN: Negative for lesions, rash PSYCH: Negative for sleep disturbance, mood disorder and recent psychosocial stressors. HEMATOLOGY Negative for prolonged bleeding, bruising easily, and swollen nodes. ENDOCRINE: Negative for cold or heat intolerance, polyuria, polydipsia and goiter. NEURO: negative for tremor, gait imbalance, syncope and seizures. The remainder of the review of systems is noncontributory.   Physical Exam: BP (!) 154/76 (BP Location: Left Arm, Patient Position: Sitting, Cuff Size: Small)   Pulse 70   Temp 97.9 F (36.6 C) (Oral)   Ht 5\' 6"  (1.676 m)   Wt 127 lb (57.6 kg)   BMI 20.50  kg/m  GENERAL: The patient is AO x3, in no acute distress. Elder. Uses cane. HEENT: Head is normocephalic and atraumatic. EOMI are intact. Mouth is well hydrated and without lesions. NECK: Supple. No masses LUNGS: Clear to auscultation. No presence of rhonchi/wheezing/rales. Adequate chest expansion HEART: RRR, normal s1 and s2. ABDOMEN: Soft, nontender, no guarding, no peritoneal signs, and nondistended. BS +. No masses. EXTREMITIES: Without any cyanosis, clubbing, rash, lesions or edema. NEUROLOGIC: AOx3, no focal motor deficit. SKIN: no jaundice, no rashes   Imaging/Labs: as above  I personally reviewed and interpreted the available labs, imaging and endoscopic files.  Impression and Plan: EULAH WALKUP is a 87 y.o. female with past medical history of hiatal hernia, hypertension, anxiety and arthritis, who presents for evaluation of abdominal pain and vomiting.  Patient had sudden onset of symptoms which have improved on its own.  I consider her symptoms could be related to an episode of infectious gastroenteritis that was self-limited.  She can take some Zofran as needed if she is presenting recurrent nausea but I do not consider that she warrants any further investigations at  this point.  Regarding her chest discomfort, this could be related to GERD for which I agree with increasing her dose of omeprazole to 40 mg every day.  Both the patient and her companion agree and understood.  - Agree with increasing omeprazole to 40 mg every day - Can take Zofran as needed for nausea episodes  All questions were answered.      Dawn Blazing, MD Gastroenterology and Hepatology [facility]

## 2020-06-04 DIAGNOSIS — I10 Essential (primary) hypertension: Secondary | ICD-10-CM | POA: Diagnosis not present

## 2020-06-05 DIAGNOSIS — E785 Hyperlipidemia, unspecified: Secondary | ICD-10-CM | POA: Diagnosis not present

## 2020-06-05 DIAGNOSIS — J449 Chronic obstructive pulmonary disease, unspecified: Secondary | ICD-10-CM | POA: Diagnosis not present

## 2020-06-05 DIAGNOSIS — G2581 Restless legs syndrome: Secondary | ICD-10-CM | POA: Diagnosis not present

## 2020-06-22 DIAGNOSIS — M79675 Pain in left toe(s): Secondary | ICD-10-CM | POA: Diagnosis not present

## 2020-06-22 DIAGNOSIS — I739 Peripheral vascular disease, unspecified: Secondary | ICD-10-CM | POA: Diagnosis not present

## 2020-06-22 DIAGNOSIS — M79671 Pain in right foot: Secondary | ICD-10-CM | POA: Diagnosis not present

## 2020-06-22 DIAGNOSIS — M79672 Pain in left foot: Secondary | ICD-10-CM | POA: Diagnosis not present

## 2020-06-22 DIAGNOSIS — L110 Acquired keratosis follicularis: Secondary | ICD-10-CM | POA: Diagnosis not present

## 2020-06-22 DIAGNOSIS — M79674 Pain in right toe(s): Secondary | ICD-10-CM | POA: Diagnosis not present

## 2020-07-06 DIAGNOSIS — I10 Essential (primary) hypertension: Secondary | ICD-10-CM | POA: Diagnosis not present

## 2020-07-07 DIAGNOSIS — R5383 Other fatigue: Secondary | ICD-10-CM | POA: Diagnosis not present

## 2020-07-07 DIAGNOSIS — Z7189 Other specified counseling: Secondary | ICD-10-CM | POA: Diagnosis not present

## 2020-07-07 DIAGNOSIS — E559 Vitamin D deficiency, unspecified: Secondary | ICD-10-CM | POA: Diagnosis not present

## 2020-07-07 DIAGNOSIS — Z299 Encounter for prophylactic measures, unspecified: Secondary | ICD-10-CM | POA: Diagnosis not present

## 2020-07-07 DIAGNOSIS — Z79899 Other long term (current) drug therapy: Secondary | ICD-10-CM | POA: Diagnosis not present

## 2020-07-07 DIAGNOSIS — Z0000 Encounter for general adult medical examination without abnormal findings: Secondary | ICD-10-CM | POA: Diagnosis not present

## 2020-07-07 DIAGNOSIS — Z681 Body mass index (BMI) 19 or less, adult: Secondary | ICD-10-CM | POA: Diagnosis not present

## 2020-07-07 DIAGNOSIS — E7800 Pure hypercholesterolemia, unspecified: Secondary | ICD-10-CM | POA: Diagnosis not present

## 2020-07-29 ENCOUNTER — Ambulatory Visit (INDEPENDENT_AMBULATORY_CARE_PROVIDER_SITE_OTHER): Payer: Medicare Other | Admitting: Gastroenterology

## 2020-08-05 DIAGNOSIS — G2581 Restless legs syndrome: Secondary | ICD-10-CM | POA: Diagnosis not present

## 2020-08-05 DIAGNOSIS — E785 Hyperlipidemia, unspecified: Secondary | ICD-10-CM | POA: Diagnosis not present

## 2020-08-05 DIAGNOSIS — J449 Chronic obstructive pulmonary disease, unspecified: Secondary | ICD-10-CM | POA: Diagnosis not present

## 2020-08-05 DIAGNOSIS — I10 Essential (primary) hypertension: Secondary | ICD-10-CM | POA: Diagnosis not present

## 2020-09-03 DIAGNOSIS — I10 Essential (primary) hypertension: Secondary | ICD-10-CM | POA: Diagnosis not present

## 2020-09-05 DIAGNOSIS — I10 Essential (primary) hypertension: Secondary | ICD-10-CM | POA: Diagnosis not present

## 2020-09-16 DIAGNOSIS — Z299 Encounter for prophylactic measures, unspecified: Secondary | ICD-10-CM | POA: Diagnosis not present

## 2020-09-16 DIAGNOSIS — I471 Supraventricular tachycardia: Secondary | ICD-10-CM | POA: Diagnosis not present

## 2020-09-16 DIAGNOSIS — N183 Chronic kidney disease, stage 3 unspecified: Secondary | ICD-10-CM | POA: Diagnosis not present

## 2020-09-16 DIAGNOSIS — Z789 Other specified health status: Secondary | ICD-10-CM | POA: Diagnosis not present

## 2020-09-16 DIAGNOSIS — K219 Gastro-esophageal reflux disease without esophagitis: Secondary | ICD-10-CM | POA: Diagnosis not present

## 2020-09-27 DIAGNOSIS — H35033 Hypertensive retinopathy, bilateral: Secondary | ICD-10-CM | POA: Diagnosis not present

## 2020-09-27 DIAGNOSIS — H524 Presbyopia: Secondary | ICD-10-CM | POA: Diagnosis not present

## 2020-10-06 DIAGNOSIS — I10 Essential (primary) hypertension: Secondary | ICD-10-CM | POA: Diagnosis not present

## 2020-10-07 DIAGNOSIS — M778 Other enthesopathies, not elsewhere classified: Secondary | ICD-10-CM | POA: Diagnosis not present

## 2020-10-07 DIAGNOSIS — Z681 Body mass index (BMI) 19 or less, adult: Secondary | ICD-10-CM | POA: Diagnosis not present

## 2020-10-07 DIAGNOSIS — Z299 Encounter for prophylactic measures, unspecified: Secondary | ICD-10-CM | POA: Diagnosis not present

## 2020-10-07 DIAGNOSIS — J029 Acute pharyngitis, unspecified: Secondary | ICD-10-CM | POA: Diagnosis not present

## 2020-10-07 DIAGNOSIS — M1990 Unspecified osteoarthritis, unspecified site: Secondary | ICD-10-CM | POA: Diagnosis not present

## 2020-10-28 DIAGNOSIS — M79675 Pain in left toe(s): Secondary | ICD-10-CM | POA: Diagnosis not present

## 2020-10-28 DIAGNOSIS — I82811 Embolism and thrombosis of superficial veins of right lower extremities: Secondary | ICD-10-CM | POA: Diagnosis not present

## 2020-10-28 DIAGNOSIS — M79671 Pain in right foot: Secondary | ICD-10-CM | POA: Diagnosis not present

## 2020-10-28 DIAGNOSIS — I739 Peripheral vascular disease, unspecified: Secondary | ICD-10-CM | POA: Diagnosis not present

## 2020-10-28 DIAGNOSIS — I8000 Phlebitis and thrombophlebitis of superficial vessels of unspecified lower extremity: Secondary | ICD-10-CM | POA: Diagnosis not present

## 2020-10-28 DIAGNOSIS — I8001 Phlebitis and thrombophlebitis of superficial vessels of right lower extremity: Secondary | ICD-10-CM | POA: Diagnosis not present

## 2020-10-28 DIAGNOSIS — M79672 Pain in left foot: Secondary | ICD-10-CM | POA: Diagnosis not present

## 2020-10-28 DIAGNOSIS — M79674 Pain in right toe(s): Secondary | ICD-10-CM | POA: Diagnosis not present

## 2020-10-28 DIAGNOSIS — L110 Acquired keratosis follicularis: Secondary | ICD-10-CM | POA: Diagnosis not present

## 2020-11-02 ENCOUNTER — Encounter: Payer: Self-pay | Admitting: Cardiology

## 2020-11-02 ENCOUNTER — Ambulatory Visit: Payer: Medicare Other | Admitting: Cardiology

## 2020-11-02 VITALS — BP 130/54 | HR 68 | Ht 66.0 in | Wt 133.0 lb

## 2020-11-02 DIAGNOSIS — Z681 Body mass index (BMI) 19 or less, adult: Secondary | ICD-10-CM | POA: Diagnosis not present

## 2020-11-02 DIAGNOSIS — R002 Palpitations: Secondary | ICD-10-CM

## 2020-11-02 DIAGNOSIS — Z23 Encounter for immunization: Secondary | ICD-10-CM | POA: Diagnosis not present

## 2020-11-02 DIAGNOSIS — K219 Gastro-esophageal reflux disease without esophagitis: Secondary | ICD-10-CM | POA: Diagnosis not present

## 2020-11-02 DIAGNOSIS — I82890 Acute embolism and thrombosis of other specified veins: Secondary | ICD-10-CM | POA: Diagnosis not present

## 2020-11-02 DIAGNOSIS — Z299 Encounter for prophylactic measures, unspecified: Secondary | ICD-10-CM | POA: Diagnosis not present

## 2020-11-02 DIAGNOSIS — I10 Essential (primary) hypertension: Secondary | ICD-10-CM | POA: Diagnosis not present

## 2020-11-02 MED ORDER — METOPROLOL TARTRATE 25 MG PO TABS: 37.5000 mg | ORAL_TABLET | Freq: Two times a day (BID) | ORAL | Status: DC

## 2020-11-02 NOTE — Patient Instructions (Signed)
Medication Instructions:  Continue all current medications.   Labwork: none  Testing/Procedures: none  Follow-Up: 6 months   Any Other Special Instructions Will Be Listed Below (If Applicable).   If you need a refill on your cardiac medications before your next appointment, please call your pharmacy.  

## 2020-11-02 NOTE — Progress Notes (Signed)
Clinical Summary Ms. Rockholt is a 87 y.o.female seen today for follow up of the following medical problems.    Palpitations - Prior event monitoring demonstrated sinus rhythm with PACs and PVCs and PSVT, the longest lasting 8 beats.  Monitor from Dr Sherryll Burger reviwed. Occasioanl extra heart beat and short episodes of an abnormal heart rhythm called SVT. None of these findings are dangerous and are similar to her prior monitor  - we increased lopressor to 37.5mg  bid - palpitations have improved    2. HTN - compliant with meds - home bp's vary 140s-150s in general   3. CHest pain - has been thought to be GI related, improves with belching   4. LE edema - some chronic LE edema unchagned   Past Medical History:  Diagnosis Date   Anxiety    Arthritis    Hiatal hernia    Hypertension    Reflux      Allergies  Allergen Reactions   Penicillins Swelling    Has patient had a PCN reaction causing immediate rash, facial/tongue/throat swelling, SOB or lightheadedness with hypotension: NO Has patient had a PCN reaction causing severe rash involving mucus membranes or skin necrosis: no Has patient had a PCN reaction that required hospitalization : no Has patient had a PCN reaction occurring within the last 10 years: no If all of the above answers are "NO", then may proceed with Cephalosporin use.     Current Outpatient Medications  Medication Sig Dispense Refill   ALPRAZolam (XANAX) 0.25 MG tablet Take 0.25 mg by mouth 2 (two) times daily as needed for anxiety.      amLODipine (NORVASC) 2.5 MG tablet Take 2.5 mg by mouth daily.     Cholecalciferol (VITAMIN D3) 400 units CAPS Take 1 capsule by mouth daily.  7   hydrochlorothiazide (MICROZIDE) 12.5 MG capsule Take one tablet (12.5mg ) by mouth on Monday and Thursday only.     metoprolol tartrate (LOPRESSOR) 25 MG tablet Take 1.5 tablets (37.5 mg total) by mouth 2 (two) times daily. 270 tablet 3   Multiple Vitamin  (MULTIVITAMIN) tablet Take 1 tablet by mouth daily.     omeprazole (PRILOSEC) 20 MG capsule Take 40 mg by mouth daily.     ondansetron (ZOFRAN) 4 MG tablet Take 1 tablet (4 mg total) by mouth every 12 (twelve) hours as needed for nausea or vomiting. 30 tablet 1   potassium chloride SA (K-DUR,KLOR-CON) 20 MEQ tablet Take 20 mEq by mouth daily.     VYZULTA 0.024 % SOLN Apply 1 drop to eye at bedtime.     No current facility-administered medications for this visit.     Past Surgical History:  Procedure Laterality Date   ABDOMINAL HYSTERECTOMY     ABDOMINAL SURGERY     resection   APPENDECTOMY     HEMORROIDECTOMY     TONSILLECTOMY       Allergies  Allergen Reactions   Penicillins Swelling    Has patient had a PCN reaction causing immediate rash, facial/tongue/throat swelling, SOB or lightheadedness with hypotension: NO Has patient had a PCN reaction causing severe rash involving mucus membranes or skin necrosis: no Has patient had a PCN reaction that required hospitalization : no Has patient had a PCN reaction occurring within the last 10 years: no If all of the above answers are "NO", then may proceed with Cephalosporin use.      Family History  Problem Relation Age of Onset   Arthritis Mother  Myasthenia gravis Sister    Cancer Brother    Hypertension Sister    Diabetes Sister      Social History Ms. Demuro reports that she has never smoked. She has never used smokeless tobacco. Ms. Gains reports no history of alcohol use.   Review of Systems CONSTITUTIONAL: No weight loss, fever, chills, weakness or fatigue.  HEENT: Eyes: No visual loss, blurred vision, double vision or yellow sclerae.No hearing loss, sneezing, congestion, runny nose or sore throat.  SKIN: No rash or itching.  CARDIOVASCULAR: per hpi RESPIRATORY: No shortness of breath, cough or sputum.  GASTROINTESTINAL: No anorexia, nausea, vomiting or diarrhea. No abdominal pain or blood.   GENITOURINARY: No burning on urination, no polyuria NEUROLOGICAL: No headache, dizziness, syncope, paralysis, ataxia, numbness or tingling in the extremities. No change in bowel or bladder control.  MUSCULOSKELETAL: No muscle, back pain, joint pain or stiffness.  LYMPHATICS: No enlarged nodes. No history of splenectomy.  PSYCHIATRIC: No history of depression or anxiety.  ENDOCRINOLOGIC: No reports of sweating, cold or heat intolerance. No polyuria or polydipsia.  Marland Kitchen   Physical Examination Today's Vitals   11/02/20 1314  BP: (!) 130/54  Pulse: 68  SpO2: 98%  Weight: 133 lb (60.3 kg)  Height: 5\' 6"  (1.676 m)   Body mass index is 21.47 kg/m.  Gen: resting comfortably, no acute distress HEENT: no scleral icterus, pupils equal round and reactive, no palptable cervical adenopathy,  CV: RRR, no m/r/g no jvd Resp: Clear to auscultation bilaterally GI: abdomen is soft, non-tender, non-distended, normal bowel sounds, no hepatosplenomegaly MSK: extremities are warm, no edema.  Skin: warm, no rash Neuro:  no focal deficits Psych: appropriate affect    Assessment and Plan  Palpitations - improved on higher lopressor dosing, continue current dose  2. HTN - overall reasonable control given advanced age, would not be aggressive with meds - continue current therapy      , M.D.

## 2020-11-05 DIAGNOSIS — I10 Essential (primary) hypertension: Secondary | ICD-10-CM | POA: Diagnosis not present

## 2020-11-24 DIAGNOSIS — Z681 Body mass index (BMI) 19 or less, adult: Secondary | ICD-10-CM | POA: Diagnosis not present

## 2020-11-24 DIAGNOSIS — R5383 Other fatigue: Secondary | ICD-10-CM | POA: Diagnosis not present

## 2020-11-24 DIAGNOSIS — I10 Essential (primary) hypertension: Secondary | ICD-10-CM | POA: Diagnosis not present

## 2020-11-24 DIAGNOSIS — Z299 Encounter for prophylactic measures, unspecified: Secondary | ICD-10-CM | POA: Diagnosis not present

## 2020-11-24 DIAGNOSIS — I82890 Acute embolism and thrombosis of other specified veins: Secondary | ICD-10-CM | POA: Diagnosis not present

## 2020-11-25 DIAGNOSIS — R5383 Other fatigue: Secondary | ICD-10-CM | POA: Diagnosis not present

## 2020-11-30 DIAGNOSIS — R1011 Right upper quadrant pain: Secondary | ICD-10-CM | POA: Diagnosis not present

## 2020-11-30 DIAGNOSIS — H9193 Unspecified hearing loss, bilateral: Secondary | ICD-10-CM | POA: Diagnosis not present

## 2020-11-30 DIAGNOSIS — R52 Pain, unspecified: Secondary | ICD-10-CM | POA: Diagnosis not present

## 2020-11-30 DIAGNOSIS — Z299 Encounter for prophylactic measures, unspecified: Secondary | ICD-10-CM | POA: Diagnosis not present

## 2020-11-30 DIAGNOSIS — I10 Essential (primary) hypertension: Secondary | ICD-10-CM | POA: Diagnosis not present

## 2020-12-06 DIAGNOSIS — G2581 Restless legs syndrome: Secondary | ICD-10-CM | POA: Diagnosis not present

## 2020-12-06 DIAGNOSIS — J449 Chronic obstructive pulmonary disease, unspecified: Secondary | ICD-10-CM | POA: Diagnosis not present

## 2020-12-06 DIAGNOSIS — E785 Hyperlipidemia, unspecified: Secondary | ICD-10-CM | POA: Diagnosis not present

## 2020-12-06 DIAGNOSIS — I10 Essential (primary) hypertension: Secondary | ICD-10-CM | POA: Diagnosis not present

## 2020-12-07 ENCOUNTER — Emergency Department (HOSPITAL_COMMUNITY): Payer: Medicare Other

## 2020-12-07 ENCOUNTER — Encounter (HOSPITAL_COMMUNITY): Payer: Self-pay | Admitting: Emergency Medicine

## 2020-12-07 ENCOUNTER — Emergency Department (HOSPITAL_COMMUNITY)
Admission: EM | Admit: 2020-12-07 | Discharge: 2020-12-07 | Disposition: A | Discharge status: Home / Self Care | Payer: Medicare Other | Attending: Emergency Medicine | Admitting: Emergency Medicine

## 2020-12-07 ENCOUNTER — Other Ambulatory Visit: Payer: Self-pay

## 2020-12-07 DIAGNOSIS — M25551 Pain in right hip: Secondary | ICD-10-CM | POA: Diagnosis not present

## 2020-12-07 DIAGNOSIS — M545 Low back pain, unspecified: Secondary | ICD-10-CM | POA: Diagnosis not present

## 2020-12-07 DIAGNOSIS — I10 Essential (primary) hypertension: Secondary | ICD-10-CM | POA: Diagnosis not present

## 2020-12-07 DIAGNOSIS — Z79899 Other long term (current) drug therapy: Secondary | ICD-10-CM | POA: Diagnosis not present

## 2020-12-07 DIAGNOSIS — I6782 Cerebral ischemia: Secondary | ICD-10-CM | POA: Diagnosis not present

## 2020-12-07 DIAGNOSIS — R531 Weakness: Secondary | ICD-10-CM | POA: Diagnosis not present

## 2020-12-07 DIAGNOSIS — W1839XA Other fall on same level, initial encounter: Secondary | ICD-10-CM | POA: Diagnosis not present

## 2020-12-07 DIAGNOSIS — R29898 Other symptoms and signs involving the musculoskeletal system: Secondary | ICD-10-CM

## 2020-12-07 DIAGNOSIS — M25561 Pain in right knee: Secondary | ICD-10-CM | POA: Diagnosis not present

## 2020-12-07 DIAGNOSIS — M549 Dorsalgia, unspecified: Secondary | ICD-10-CM | POA: Insufficient documentation

## 2020-12-07 DIAGNOSIS — M79604 Pain in right leg: Secondary | ICD-10-CM | POA: Diagnosis not present

## 2020-12-07 DIAGNOSIS — G319 Degenerative disease of nervous system, unspecified: Secondary | ICD-10-CM | POA: Diagnosis not present

## 2020-12-07 DIAGNOSIS — G238 Other specified degenerative diseases of basal ganglia: Secondary | ICD-10-CM | POA: Diagnosis not present

## 2020-12-07 LAB — CBC WITH DIFFERENTIAL/PLATELET
Abs Immature Granulocytes: 0.02 10*3/uL (ref 0.00–0.07)
Basophils Absolute: 0 10*3/uL (ref 0.0–0.1)
Basophils Relative: 0 %
Eosinophils Absolute: 0 10*3/uL (ref 0.0–0.5)
Eosinophils Relative: 1 %
HCT: 38.2 % (ref 36.0–46.0)
Hemoglobin: 12.4 g/dL (ref 12.0–15.0)
Immature Granulocytes: 0 %
Lymphocytes Relative: 30 %
Lymphs Abs: 1.7 10*3/uL (ref 0.7–4.0)
MCH: 29.9 pg (ref 26.0–34.0)
MCHC: 32.5 g/dL (ref 30.0–36.0)
MCV: 92 fL (ref 80.0–100.0)
Monocytes Absolute: 0.5 10*3/uL (ref 0.1–1.0)
Monocytes Relative: 9 %
Neutro Abs: 3.4 10*3/uL (ref 1.7–7.7)
Neutrophils Relative %: 60 %
Platelets: 192 10*3/uL (ref 150–400)
RBC: 4.15 MIL/uL (ref 3.87–5.11)
RDW: 13.7 % (ref 11.5–15.5)
WBC: 5.6 10*3/uL (ref 4.0–10.5)
nRBC: 0 % (ref 0.0–0.2)

## 2020-12-07 LAB — BASIC METABOLIC PANEL
Anion gap: 6 (ref 5–15)
BUN: 10 mg/dL (ref 8–23)
CO2: 25 mmol/L (ref 22–32)
Calcium: 9 mg/dL (ref 8.9–10.3)
Chloride: 108 mmol/L (ref 98–111)
Creatinine, Ser: 0.92 mg/dL (ref 0.44–1.00)
GFR, Estimated: 56 mL/min — ABNORMAL LOW (ref >60)
Glucose, Bld: 126 mg/dL — ABNORMAL HIGH (ref 70–99)
Potassium: 3.5 mmol/L (ref 3.5–5.1)
Sodium: 139 mmol/L (ref 135–145)

## 2020-12-07 LAB — CK: Total CK: 91 U/L (ref 38–234)

## 2020-12-07 MED ORDER — HYDROCODONE-ACETAMINOPHEN 5-325 MG PO TABS
1.0000 | ORAL_TABLET | Freq: Once | ORAL | Status: AC
  Administered 2020-12-07: 1 via ORAL
  Filled 2020-12-07: qty 1

## 2020-12-07 NOTE — ED Notes (Signed)
Pt walked using a walker to doorway and back to bed. Total of 10 ft.

## 2020-12-07 NOTE — Discharge Instructions (Addendum)
You are seen in the emergency department for weakness in your legs right greater than left.  You had a CAT scan of your head and lumbar spine along with lab work and x-rays of your right hip and knee and an ultrasound of your leg to look for blood clot.  All these test do not show an obvious explanation for your symptoms.  Please contact your primary care doctor for follow-up.  You may end up needing an MRI for further evaluation.  Return to the emergency department if any worsening or concerning symptoms

## 2020-12-07 NOTE — ED Triage Notes (Signed)
Pt reports right leg pain that radiates to her right hip and back x 2 weeks. Pt was seen for the same at [facility] 2 weeks ago.

## 2020-12-07 NOTE — ED Provider Notes (Signed)
[facility] EMERGENCY DEPARTMENT Provider Note   CSN: [id number] Arrival date & time: 12/07/20  1440     History Chief Complaint  Patient presents with   Leg Pain    Dawn Estrada is a 87 y.o. female.  She is here with inability to walk due to right leg pain and weakness that started this morning.  She said she had a fall about 3 weeks ago.  She has chronic back pain and that worsened along with pain in her right hip and knee.  No numbness.  Denies any headache blurry vision double vision.  She was seen last month at [facility] for calf pain and had a duplex that showed superficial thrombophlebitis.  The history is provided by the patient.  Leg Pain Location:  Hip, leg and knee Time since incident:  2 weeks Injury: yes   Mechanism of injury: fall   Hip location:  R hip Leg location:  R upper leg Knee location:  R knee Pain details:    Quality:  Aching   Severity:  Severe   Onset quality:  Gradual   Duration:  1 day   Timing:  Constant   Progression:  Unchanged Chronicity:  New Dislocation: no   Relieved by:  None tried Worsened by:  Bearing weight Ineffective treatments:  None tried Associated symptoms: back pain and muscle weakness   Associated symptoms: no fever, no neck pain and no numbness       Past Medical History:  Diagnosis Date   Anxiety    Arthritis    Hiatal hernia    Hypertension    Reflux     Patient Active Problem List   Diagnosis Date Noted   GERD (gastroesophageal reflux disease) 05/26/2020   Hypertension    CAP (community acquired pneumonia) 11/25/2014   Generalized weakness 11/25/2014   Hypokalemia 11/25/2014   Elevated troponin 11/25/2014   Chest pain 11/25/2014   Leukocytosis 11/25/2014   Gastroenteritis 11/25/2014   Sinus tachycardia 11/25/2014    Past Surgical History:  Procedure Laterality Date   ABDOMINAL HYSTERECTOMY     ABDOMINAL SURGERY     resection   APPENDECTOMY     HEMORROIDECTOMY     TONSILLECTOMY        OB History     Gravida  3   Para  3   Term  3   Preterm      AB      Living         SAB      IAB      Ectopic      Multiple      Live Births              Family History  Problem Relation Age of Onset   Arthritis Mother    Myasthenia gravis Sister    Cancer Brother    Hypertension Sister    Diabetes Sister     Social History   Tobacco Use   Smoking status: Never   Smokeless tobacco: Never  Vaping Use   Vaping Use: Never used  Substance Use Topics   Alcohol use: No   Drug use: Never    Home Medications Prior to Admission medications   Medication Sig Start Date End Date Taking? Authorizing Provider  ALPRAZolam (XANAX) 0.25 MG tablet Take 0.25 mg by mouth 2 (two) times daily as needed for anxiety.     [provider]  amLODipine (NORVASC) 2.5 MG tablet Take 2.5 mg by  mouth daily. 04/09/20   [provider]  Cholecalciferol (VITAMIN D3) 400 units CAPS Take 1 capsule by mouth daily. 06/22/17   [provider]  hydrochlorothiazide (MICROZIDE) 12.5 MG capsule Take one tablet (12.5mg ) by mouth on Monday and Thursday only. 10/28/19   Verta Ellen., NP  metoprolol tartrate (LOPRESSOR) 25 MG tablet Take 1.5 tablets (37.5 mg total) by mouth 2 (two) times daily. 11/02/20   Arnoldo Lenis, MD  Multiple Vitamin (MULTIVITAMIN) tablet Take 1 tablet by mouth daily.    [provider]  omeprazole (PRILOSEC) 20 MG capsule Take 40 mg by mouth daily. 03/20/20   [provider]  ondansetron (ZOFRAN) 4 MG tablet Take 1 tablet (4 mg total) by mouth every 12 (twelve) hours as needed for nausea or vomiting. 05/26/20   Montez Morita, Quillian Quince, MD  potassium chloride SA (K-DUR,KLOR-CON) 20 MEQ tablet Take 20 mEq by mouth daily. 11/19/14   [provider]  VYZULTA 0.024 % SOLN Apply 1 drop to eye at bedtime. 04/12/20   [provider]    Allergies    Diclofenac sodium and Penicillins  Review of Systems    Review of Systems  Constitutional:  Negative for fever.  HENT:  Negative for sore throat.   Eyes:  Negative for visual disturbance.  Respiratory:  Negative for shortness of breath.   Cardiovascular:  Negative for chest pain.  Gastrointestinal:  Negative for abdominal pain.  Genitourinary:  Negative for dysuria.  Musculoskeletal:  Positive for back pain and gait problem. Negative for neck pain.  Skin:  Negative for rash.  Neurological:  Positive for weakness. Negative for speech difficulty and headaches.   Physical Exam Updated Vital Signs BP (!) 156/69 (BP Location: Right Arm)   Pulse 77   Temp 98.4 F (36.9 C) (Oral)   Resp 17   SpO2 99%   Physical Exam Vitals and nursing note reviewed.  Constitutional:      General: She is not in acute distress.    Appearance: Normal appearance. She is well-developed.  HENT:     Head: Normocephalic and atraumatic.  Eyes:     Conjunctiva/sclera: Conjunctivae normal.  Cardiovascular:     Rate and Rhythm: Normal rate and regular rhythm.     Heart sounds: No murmur heard. Pulmonary:     Effort: Pulmonary effort is normal. No respiratory distress.     Breath sounds: Normal breath sounds.  Abdominal:     Palpations: Abdomen is soft.     Tenderness: There is no abdominal tenderness. There is no guarding or rebound.  Musculoskeletal:        General: Tenderness present. No deformity.     Cervical back: Neck supple.     Comments: She has diffuse tenderness from right hip right thigh and right knee.  Compartments are soft.  No gross deformity shortening or rotation.  Skin:    General: Skin is warm and dry.  Neurological:     Mental Status: She is alert.     Cranial Nerves: No cranial nerve deficit.     Sensory: No sensory deficit.     Comments: She is awake and alert.  She is barely able to lift her left leg off of the bed and cannot lift her right leg off of the bed.  Plantarflexion and dorsiflexion are normal.  Difficult to ascertain  whether pain is limiting her or there is true weakness.    ED Results / Procedures / Treatments   Labs (all labs  ordered are listed, but only abnormal results are displayed) Labs Reviewed  BASIC METABOLIC PANEL - Abnormal; Notable for the following components:      Result Value   Glucose, Bld 126 (*)    GFR, Estimated 56 (*)    All other components within normal limits  CBC WITH DIFFERENTIAL/PLATELET  CK    EKG None  Radiology CT Head Wo Contrast  Result Date: 12/07/2020 CLINICAL DATA:  RIGHT leg and lower back pain for 2 weeks, recent fall, neurological deficit acute, suspected stroke EXAM: CT HEAD WITHOUT CONTRAST TECHNIQUE: Contiguous axial images were obtained from the base of the skull through the vertex without intravenous contrast. COMPARISON:  04/08/2020 FINDINGS: Brain: Generalized atrophy. Normal ventricular morphology. No midline shift or mass effect. Mild small vessel chronic ischemic changes of deep cerebral white matter. Basal ganglia calcifications bilaterally. Dense dural calcification along the tentorium 14 x 12 x 7 mm, unchanged. No intracranial hemorrhage, mass lesion, or evidence of acute infarction. No extra-axial fluid collections. Her Vascular: No hyperdense vessels Skull: Calvaria intact. 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 Sinuses/Orbits: Clear Other: N/A IMPRESSION: Atrophy with small vessel chronic ischemic changes of deep cerebral white matter. No acute intracranial abnormalities. Electronically Signed   By: Lavonia Dana M.D.   On: 12/07/2020 18:51   CT Lumbar Spine Wo Contrast  Result Date: 12/07/2020 CLINICAL DATA:  Low back pain, progressive neurologic deficit. Low back and right leg pain for 2 weeks. Recent fall. EXAM: CT LUMBAR SPINE WITHOUT CONTRAST TECHNIQUE: Multidetector CT imaging of the lumbar spine was performed without intravenous contrast administration.  Multiplanar CT image reconstructions were also generated. COMPARISON:  CT abdomen and pelvis 05/18/2020 FINDINGS: Segmentation: 5 lumbar type vertebrae. Alignment: Normal. Vertebrae: No acute fracture or suspicious osseous lesion. Paraspinal and other soft tissues: Mild abdominal aortic atherosclerosis without aneurysm. Partially visualized bilateral renal cysts. Disc levels: T12-L1: Negative. L1-2: Mild disc space narrowing and vacuum disc. Disc bulging, endplate spurring, mildly prominent dorsal epidural fat, and mild facet and ligamentum flavum hypertrophy result in mild spinal stenosis without significant neural foraminal stenosis. L2-3: Disc bulging and mild facet and ligamentum flavum hypertrophy result in moderate spinal stenosis without significant neural foraminal stenosis. L3-4: Disc bulging and mild facet and ligamentum flavum hypertrophy result in mild spinal stenosis without significant neural foraminal stenosis. L4-5: Mild disc space narrowing. Disc bulging, a suspected central to right paracentral disc protrusion, and mild facet and ligamentum flavum hypertrophy result in moderate spinal stenosis and mild bilateral neural foraminal stenosis. L5-S1: Prominent epidural fat. Disc bulging without significant stenosis. IMPRESSION: 1. No acute osseous abnormality. 2. Multilevel lumbar disc and facet degeneration with moderate spinal stenosis at L2-3 and L4-5. 3. Aortic Atherosclerosis (ICD10-I70.0). Electronically Signed   By: Logan Bores M.D.   On: 12/07/2020 18:49   US Venous Img Lower Right (DVT Study)  Result Date: 12/07/2020 CLINICAL DATA:  Pain. EXAM: RIGHT LOWER EXTREMITY VENOUS DOPPLER ULTRASOUND TECHNIQUE: Gray-scale sonography with compression, as well as color and duplex ultrasound, were performed to evaluate the deep venous system(s) from the level of the common femoral vein through the popliteal and proximal calf veins. COMPARISON:  None. FINDINGS: VENOUS Normal compressibility of the  common femoral, superficial femoral, and popliteal veins, as well as the visualized calf veins. Visualized portions of profunda femoral vein and great saphenous vein unremarkable. No filling defects to suggest DVT on grayscale or color Doppler imaging. Doppler waveforms show normal  direction of venous flow, normal respiratory plasticity and response to augmentation. Limited views of the contralateral common femoral vein are unremarkable. OTHER None. Limitations: none IMPRESSION: Negative. Electronically Signed   By: Darliss Cheney M.D.   On: 12/07/2020 17:49   DG Knee Complete 4 Views Right  Result Date: 12/07/2020 CLINICAL DATA:  87 year old with right knee pain. EXAM: RIGHT KNEE - COMPLETE 4+ VIEW COMPARISON:  None. FINDINGS: Bones are under mineralized. Mild medial tibiofemoral joint space narrowing. Mild patellofemoral spurring with subchondral cyst in the central patella. Chondrocalcinosis. Quadriceps and patellar tendon enthesophytes. No significant joint effusion. No fracture, erosion, or bony destruction. IMPRESSION: 1. No acute osseous abnormality. 2. Mild patellofemoral osteoarthritis and chondrocalcinosis. 3. Quadriceps and patellar tendon enthesophytes. Electronically Signed   By: Narda Rutherford M.D.   On: 12/07/2020 16:45   DG Hip Unilat With Pelvis 2-3 Views Right  Result Date: 12/07/2020 CLINICAL DATA:  RIGHT hip pain with radiation into the knee in a 87 year old. EXAM: DG HIP (WITH OR WITHOUT PELVIS) 2-3V RIGHT COMPARISON:  Knee evaluation of the same date. Abdomen and pelvis CT of the April of 2022. FINDINGS: Osteopenia. No sign of acute fracture of the bony pelvis. RIGHT hip is located without evidence of fracture. Degenerative changes are noted about the RIGHT hip. IMPRESSION: Mild degenerative change of the right hip. No acute fracture. Electronically Signed   By: Donzetta Kohut M.D.   On: 12/07/2020 16:34    Procedures Procedures   Medications Ordered in ED Medications   HYDROcodone-acetaminophen (NORCO/VICODIN) 5-325 MG per tablet 1 tablet (1 tablet Oral Given 12/07/20 1914)    ED Course  I have reviewed the triage vital signs and the nursing notes.  Pertinent labs & imaging results that were available during my care of the patient were reviewed by me and considered in my medical decision making (see chart for details).  Clinical Course as of 12/08/20 1037  Tue Dec 07, 2020  2200 Patient was able to walk about 10 feet in the department.  Son is angry that we have not figured out what is wrong with her.  I recommended that she may need an MRI but that study is not available at this time.  He said he just wants to take her home. [MB]    Clinical Course User Index [MB] Terrilee Files, MD   MDM Rules/Calculators/A&P                          This patient complains of right hip and knee pain, difficulty ambulating; this involves an extensive number of treatment Options and is a complaint that carries with it a high risk of complications and Morbidity. The differential includes fracture, arthritis, DVT, stroke, radiculopathy  I ordered, reviewed and interpreted labs, which included CBC with normal white count normal hemoglobin, chemistries normal other than mildly elevated glucose, CK normal I ordered medication for pain medication with some improvement in her pain I ordered imaging studies which included x-rays of right hip and right knee, CAT scan of head and lumbar spine, duplex right lower extremity and I independently    visualized and interpreted imaging which showed degenerative changes no fracture no DVT Additional history obtained from patient's son Previous records obtained and reviewed in epic was seen at [facility] last month and had a superficial thrombophlebitis  After the interventions stated above, I reevaluated the patient and found patient still to be symptomatic and having difficulty ambulating.  Son is  asking to take patient home.  We  will follow-up with her providers.  Return instructions discussed   Final Clinical Impression(s) / ED Diagnoses Final diagnoses:  Weakness of right leg    Rx / DC Orders ED Discharge Orders     None        Hayden Rasmussen, MD 12/08/20 1039

## 2020-12-08 DIAGNOSIS — M791 Myalgia, unspecified site: Secondary | ICD-10-CM | POA: Diagnosis not present

## 2020-12-08 DIAGNOSIS — I10 Essential (primary) hypertension: Secondary | ICD-10-CM | POA: Diagnosis not present

## 2020-12-08 DIAGNOSIS — M549 Dorsalgia, unspecified: Secondary | ICD-10-CM | POA: Diagnosis not present

## 2020-12-08 DIAGNOSIS — R350 Frequency of micturition: Secondary | ICD-10-CM | POA: Diagnosis not present

## 2020-12-08 DIAGNOSIS — Z299 Encounter for prophylactic measures, unspecified: Secondary | ICD-10-CM | POA: Diagnosis not present

## 2020-12-10 DIAGNOSIS — R002 Palpitations: Secondary | ICD-10-CM | POA: Diagnosis not present

## 2020-12-10 DIAGNOSIS — Z299 Encounter for prophylactic measures, unspecified: Secondary | ICD-10-CM | POA: Diagnosis not present

## 2020-12-10 DIAGNOSIS — M549 Dorsalgia, unspecified: Secondary | ICD-10-CM | POA: Diagnosis not present

## 2020-12-10 DIAGNOSIS — N390 Urinary tract infection, site not specified: Secondary | ICD-10-CM | POA: Diagnosis not present

## 2020-12-10 DIAGNOSIS — I10 Essential (primary) hypertension: Secondary | ICD-10-CM | POA: Diagnosis not present

## 2020-12-17 DIAGNOSIS — M129 Arthropathy, unspecified: Secondary | ICD-10-CM | POA: Diagnosis not present

## 2020-12-17 DIAGNOSIS — R296 Repeated falls: Secondary | ICD-10-CM | POA: Diagnosis not present

## 2020-12-17 DIAGNOSIS — R2681 Unsteadiness on feet: Secondary | ICD-10-CM | POA: Diagnosis not present

## 2020-12-20 DIAGNOSIS — M47816 Spondylosis without myelopathy or radiculopathy, lumbar region: Secondary | ICD-10-CM | POA: Diagnosis not present

## 2020-12-20 DIAGNOSIS — M48061 Spinal stenosis, lumbar region without neurogenic claudication: Secondary | ICD-10-CM | POA: Diagnosis not present

## 2020-12-20 DIAGNOSIS — R937 Abnormal findings on diagnostic imaging of other parts of musculoskeletal system: Secondary | ICD-10-CM | POA: Diagnosis not present

## 2020-12-27 DIAGNOSIS — M1990 Unspecified osteoarthritis, unspecified site: Secondary | ICD-10-CM | POA: Diagnosis not present

## 2020-12-27 DIAGNOSIS — Z713 Dietary counseling and surveillance: Secondary | ICD-10-CM | POA: Diagnosis not present

## 2020-12-27 DIAGNOSIS — Z299 Encounter for prophylactic measures, unspecified: Secondary | ICD-10-CM | POA: Diagnosis not present

## 2020-12-27 DIAGNOSIS — I10 Essential (primary) hypertension: Secondary | ICD-10-CM | POA: Diagnosis not present

## 2020-12-27 DIAGNOSIS — R2681 Unsteadiness on feet: Secondary | ICD-10-CM | POA: Diagnosis not present

## 2021-01-05 DIAGNOSIS — I10 Essential (primary) hypertension: Secondary | ICD-10-CM | POA: Diagnosis not present

## 2021-01-11 DIAGNOSIS — I10 Essential (primary) hypertension: Secondary | ICD-10-CM | POA: Diagnosis not present

## 2021-01-11 DIAGNOSIS — M7989 Other specified soft tissue disorders: Secondary | ICD-10-CM | POA: Diagnosis not present

## 2021-01-11 DIAGNOSIS — K219 Gastro-esophageal reflux disease without esophagitis: Secondary | ICD-10-CM | POA: Diagnosis not present

## 2021-01-11 DIAGNOSIS — Z299 Encounter for prophylactic measures, unspecified: Secondary | ICD-10-CM | POA: Diagnosis not present

## 2021-01-16 DIAGNOSIS — M129 Arthropathy, unspecified: Secondary | ICD-10-CM | POA: Diagnosis not present

## 2021-01-16 DIAGNOSIS — R296 Repeated falls: Secondary | ICD-10-CM | POA: Diagnosis not present

## 2021-01-16 DIAGNOSIS — R2681 Unsteadiness on feet: Secondary | ICD-10-CM | POA: Diagnosis not present

## 2021-02-04 DIAGNOSIS — I10 Essential (primary) hypertension: Secondary | ICD-10-CM | POA: Diagnosis not present

## 2021-02-16 DIAGNOSIS — R2681 Unsteadiness on feet: Secondary | ICD-10-CM | POA: Diagnosis not present

## 2021-02-16 DIAGNOSIS — M129 Arthropathy, unspecified: Secondary | ICD-10-CM | POA: Diagnosis not present

## 2021-02-16 DIAGNOSIS — R296 Repeated falls: Secondary | ICD-10-CM | POA: Diagnosis not present

## 2021-02-22 DIAGNOSIS — I471 Supraventricular tachycardia: Secondary | ICD-10-CM | POA: Diagnosis not present

## 2021-02-22 DIAGNOSIS — J069 Acute upper respiratory infection, unspecified: Secondary | ICD-10-CM | POA: Diagnosis not present

## 2021-02-22 DIAGNOSIS — Z789 Other specified health status: Secondary | ICD-10-CM | POA: Diagnosis not present

## 2021-02-22 DIAGNOSIS — Z299 Encounter for prophylactic measures, unspecified: Secondary | ICD-10-CM | POA: Diagnosis not present

## 2021-02-22 DIAGNOSIS — N183 Chronic kidney disease, stage 3 unspecified: Secondary | ICD-10-CM | POA: Diagnosis not present

## 2021-02-22 DIAGNOSIS — I10 Essential (primary) hypertension: Secondary | ICD-10-CM | POA: Diagnosis not present

## 2021-03-06 DIAGNOSIS — I10 Essential (primary) hypertension: Secondary | ICD-10-CM | POA: Diagnosis not present

## 2021-03-08 DIAGNOSIS — I10 Essential (primary) hypertension: Secondary | ICD-10-CM | POA: Diagnosis not present

## 2021-03-08 DIAGNOSIS — E782 Mixed hyperlipidemia: Secondary | ICD-10-CM | POA: Diagnosis not present

## 2021-03-09 DIAGNOSIS — Z299 Encounter for prophylactic measures, unspecified: Secondary | ICD-10-CM | POA: Diagnosis not present

## 2021-03-09 DIAGNOSIS — M549 Dorsalgia, unspecified: Secondary | ICD-10-CM | POA: Diagnosis not present

## 2021-03-09 DIAGNOSIS — I10 Essential (primary) hypertension: Secondary | ICD-10-CM | POA: Diagnosis not present

## 2021-03-09 DIAGNOSIS — Z681 Body mass index (BMI) 19 or less, adult: Secondary | ICD-10-CM | POA: Diagnosis not present

## 2021-03-09 DIAGNOSIS — I471 Supraventricular tachycardia: Secondary | ICD-10-CM | POA: Diagnosis not present

## 2021-03-09 DIAGNOSIS — Z789 Other specified health status: Secondary | ICD-10-CM | POA: Diagnosis not present

## 2021-03-10 ENCOUNTER — Other Ambulatory Visit: Payer: Self-pay | Admitting: Cardiology

## 2021-03-19 DIAGNOSIS — R296 Repeated falls: Secondary | ICD-10-CM | POA: Diagnosis not present

## 2021-03-19 DIAGNOSIS — R2681 Unsteadiness on feet: Secondary | ICD-10-CM | POA: Diagnosis not present

## 2021-03-19 DIAGNOSIS — M129 Arthropathy, unspecified: Secondary | ICD-10-CM | POA: Diagnosis not present

## 2021-04-05 DIAGNOSIS — I10 Essential (primary) hypertension: Secondary | ICD-10-CM | POA: Diagnosis not present

## 2021-04-16 DIAGNOSIS — R296 Repeated falls: Secondary | ICD-10-CM | POA: Diagnosis not present

## 2021-04-16 DIAGNOSIS — R2681 Unsteadiness on feet: Secondary | ICD-10-CM | POA: Diagnosis not present

## 2021-04-16 DIAGNOSIS — M129 Arthropathy, unspecified: Secondary | ICD-10-CM | POA: Diagnosis not present

## 2021-05-02 ENCOUNTER — Ambulatory Visit: Payer: Medicare Other | Admitting: Cardiology

## 2021-05-02 ENCOUNTER — Encounter: Payer: Self-pay | Admitting: Cardiology

## 2021-05-02 VITALS — BP 120/52 | HR 72 | Ht 66.0 in | Wt 136.2 lb

## 2021-05-02 DIAGNOSIS — I10 Essential (primary) hypertension: Secondary | ICD-10-CM | POA: Diagnosis not present

## 2021-05-02 DIAGNOSIS — R002 Palpitations: Secondary | ICD-10-CM | POA: Diagnosis not present

## 2021-05-02 NOTE — Progress Notes (Signed)
? ? ? ?Clinical Summary ?Ms. Broadbent is a 88 y.o.female ? ?Palpitations ?- Prior event monitoring demonstrated sinus rhythm with PACs and PVCs and PSVT, the longest lasting 8 beats.  ? ?- some recent palpitations, feeling of quivering. Overall tolerable.  ?  ?  ?2. HTN ?- compliant with meds ?  ?  ?3. CHest pain ?- has been thought to be GI related, improves with belching ?- no significant recent symptoms ?  ?  ?4. LE edema ?- some chronic LE edema ?- overall swelling is unchanged ? ? ?Past Medical History:  ?Diagnosis Date  ? Anxiety   ? Arthritis   ? Hiatal hernia   ? Hypertension   ? Reflux   ? ? ? ?Allergies  ?Allergen Reactions  ? Diclofenac Sodium   ? Penicillins Swelling  ?  Has patient had a PCN reaction causing immediate rash, facial/tongue/throat swelling, SOB or lightheadedness with hypotension: NO ?Has patient had a PCN reaction causing severe rash involving mucus membranes or skin necrosis: no ?Has patient had a PCN reaction that required hospitalization : no ?Has patient had a PCN reaction occurring within the last 10 years: no ?If all of the above answers are "NO", then may proceed with Cephalosporin use.  ? ? ? ?Current Outpatient Medications  ?Medication Sig Dispense Refill  ? ALPRAZolam (XANAX) 0.25 MG tablet Take 0.25 mg by mouth 2 (two) times daily as needed for anxiety.  (Patient not taking: Reported on 12/07/2020)    ? ALPRAZolam (XANAX) 0.5 MG tablet Take 0.5 mg by mouth 2 (two) times daily as needed. Take 1/2 tablet by mouth twice a day as needed for anxiety    ? amLODipine (NORVASC) 2.5 MG tablet Take 2.5 mg by mouth 2 (two) times daily.    ? Cholecalciferol (VITAMIN D3) 400 units CAPS Take 250 mcg by mouth daily.  7  ? hydrochlorothiazide (MICROZIDE) 12.5 MG capsule Take one tablet (12.5mg ) by mouth on Monday and Thursday only. (Patient taking differently: Take 12.5 mg by mouth daily. Take one tablet (12.5mg ) by mouth on Monday and Thursday only.)    ? metoprolol tartrate (LOPRESSOR) 25  MG tablet TAKE 1 AND 1/2 TABLETS BY MOUTH TWICE DAILY 270 tablet 1  ? Multiple Vitamin (MULTIVITAMIN) tablet Take 1 tablet by mouth daily.    ? omeprazole (PRILOSEC) 20 MG capsule Take 20 mg by mouth daily.    ? ondansetron (ZOFRAN) 4 MG tablet Take 1 tablet (4 mg total) by mouth every 12 (twelve) hours as needed for nausea or vomiting. (Patient not taking: No sig reported) 30 tablet 1  ? potassium chloride (KLOR-CON) 10 MEQ tablet Take 10 mEq by mouth daily.    ? potassium chloride SA (K-DUR,KLOR-CON) 20 MEQ tablet Take 20 mEq by mouth daily. (Patient not taking: Reported on 12/07/2020)    ? VYZULTA 0.024 % SOLN Apply 1 drop to eye at bedtime.    ? ?No current facility-administered medications for this visit.  ? ? ? ?Past Surgical History:  ?Procedure Laterality Date  ? ABDOMINAL HYSTERECTOMY    ? ABDOMINAL SURGERY    ? resection  ? APPENDECTOMY    ? HEMORROIDECTOMY    ? TONSILLECTOMY    ? ? ? ?Allergies  ?Allergen Reactions  ? Diclofenac Sodium   ? Penicillins Swelling  ?  Has patient had a PCN reaction causing immediate rash, facial/tongue/throat swelling, SOB or lightheadedness with hypotension: NO ?Has patient had a PCN reaction causing severe rash involving mucus membranes or skin necrosis:  no ?Has patient had a PCN reaction that required hospitalization : no ?Has patient had a PCN reaction occurring within the last 10 years: no ?If all of the above answers are "NO", then may proceed with Cephalosporin use.  ? ? ? ? ?Family History  ?Problem Relation Age of Onset  ? Arthritis Mother   ? Myasthenia gravis Sister   ? Cancer Brother   ? Hypertension Sister   ? Diabetes Sister   ? ? ? ?Social History ?Ms. Ackerley reports that she has never smoked. She has never used smokeless tobacco. ?Ms. Tincher reports no history of alcohol use. ? ? ?Review of Systems ?CONSTITUTIONAL: No weight loss, fever, chills, weakness or fatigue.  ?HEENT: Eyes: No visual loss, blurred vision, double vision or yellow sclerae.No  hearing loss, sneezing, congestion, runny nose or sore throat.  ?SKIN: No rash or itching.  ?CARDIOVASCULAR: per hpi ?RESPIRATORY: No shortness of breath, cough or sputum.  ?GASTROINTESTINAL: No anorexia, nausea, vomiting or diarrhea. No abdominal pain or blood.  ?GENITOURINARY: No burning on urination, no polyuria ?NEUROLOGICAL: No headache, dizziness, syncope, paralysis, ataxia, numbness or tingling in the extremities. No change in bowel or bladder control.  ?MUSCULOSKELETAL: No muscle, back pain, joint pain or stiffness.  ?LYMPHATICS: No enlarged nodes. No history of splenectomy.  ?PSYCHIATRIC: No history of depression or anxiety.  ?ENDOCRINOLOGIC: No reports of sweating, cold or heat intolerance. No polyuria or polydipsia.  ?. ? ? ?Physical Examination ?Today's Vitals  ? 05/02/21 1437  ?BP: (!) 120/52  ?Pulse: 72  ?SpO2: 98%  ?Weight: 136 lb 3.2 oz (61.8 kg)  ?Height: 5\' 6"  (1.676 m)  ? ?Body mass index is 21.98 kg/m?. ? ?Gen: resting comfortably, no acute distress ?HEENT: no scleral icterus, pupils equal round and reactive, no palptable cervical adenopathy,  ?CV: RRR, no m/r/g, no jvd ?Resp: Clear to auscultation bilaterally ?GI: abdomen is soft, non-tender, non-distended, normal bowel sounds, no hepatosplenomegaly ?MSK: extremities are warm, no edema.  ?Skin: warm, no rash ?Neuro:  no focal deficits ?Psych: appropriate affect ? ? ?Diagnostic Studies ? ? ? ? ?Assessment and Plan  ? ?Palpitations ?- mild symptoms, we discussed increasing lopressor however she is in favor of contniuing current dose of 37.5mg  bid ?- EKG today shows NSR ?  ?2. HTN ?- at goal, continue current meds. Conservative target given advanced age ? ? ?F/u 6 months ? ? ? ?Arnoldo Lenis, M.D. ?

## 2021-05-02 NOTE — Patient Instructions (Signed)
Medication Instructions:  Continue all current medications.   Labwork: none  Testing/Procedures: none  Follow-Up: 6 months   Any Other Special Instructions Will Be Listed Below (If Applicable).   If you need a refill on your cardiac medications before your next appointment, please call your pharmacy.  

## 2021-05-03 ENCOUNTER — Encounter: Payer: Self-pay | Admitting: *Deleted

## 2021-05-05 DIAGNOSIS — I10 Essential (primary) hypertension: Secondary | ICD-10-CM | POA: Diagnosis not present

## 2021-05-17 DIAGNOSIS — R296 Repeated falls: Secondary | ICD-10-CM | POA: Diagnosis not present

## 2021-05-17 DIAGNOSIS — M129 Arthropathy, unspecified: Secondary | ICD-10-CM | POA: Diagnosis not present

## 2021-05-17 DIAGNOSIS — R2681 Unsteadiness on feet: Secondary | ICD-10-CM | POA: Diagnosis not present

## 2021-05-25 DIAGNOSIS — I10 Essential (primary) hypertension: Secondary | ICD-10-CM | POA: Diagnosis not present

## 2021-05-25 DIAGNOSIS — Z299 Encounter for prophylactic measures, unspecified: Secondary | ICD-10-CM | POA: Diagnosis not present

## 2021-05-25 DIAGNOSIS — N183 Chronic kidney disease, stage 3 unspecified: Secondary | ICD-10-CM | POA: Diagnosis not present

## 2021-05-25 DIAGNOSIS — Z713 Dietary counseling and surveillance: Secondary | ICD-10-CM | POA: Diagnosis not present

## 2021-05-25 DIAGNOSIS — Z681 Body mass index (BMI) 19 or less, adult: Secondary | ICD-10-CM | POA: Diagnosis not present

## 2021-05-26 DIAGNOSIS — Z299 Encounter for prophylactic measures, unspecified: Secondary | ICD-10-CM | POA: Diagnosis not present

## 2021-05-26 DIAGNOSIS — I700 Atherosclerosis of aorta: Secondary | ICD-10-CM | POA: Diagnosis not present

## 2021-05-26 DIAGNOSIS — Z681 Body mass index (BMI) 19 or less, adult: Secondary | ICD-10-CM | POA: Diagnosis not present

## 2021-05-26 DIAGNOSIS — R5383 Other fatigue: Secondary | ICD-10-CM | POA: Diagnosis not present

## 2021-05-26 DIAGNOSIS — Z79899 Other long term (current) drug therapy: Secondary | ICD-10-CM | POA: Diagnosis not present

## 2021-05-26 DIAGNOSIS — Z0000 Encounter for general adult medical examination without abnormal findings: Secondary | ICD-10-CM | POA: Diagnosis not present

## 2021-05-26 DIAGNOSIS — I10 Essential (primary) hypertension: Secondary | ICD-10-CM | POA: Diagnosis not present

## 2021-05-26 DIAGNOSIS — E559 Vitamin D deficiency, unspecified: Secondary | ICD-10-CM | POA: Diagnosis not present

## 2021-05-26 DIAGNOSIS — Z7189 Other specified counseling: Secondary | ICD-10-CM | POA: Diagnosis not present

## 2021-06-05 DIAGNOSIS — I10 Essential (primary) hypertension: Secondary | ICD-10-CM | POA: Diagnosis not present

## 2021-06-16 DIAGNOSIS — M129 Arthropathy, unspecified: Secondary | ICD-10-CM | POA: Diagnosis not present

## 2021-06-16 DIAGNOSIS — R296 Repeated falls: Secondary | ICD-10-CM | POA: Diagnosis not present

## 2021-06-16 DIAGNOSIS — R2681 Unsteadiness on feet: Secondary | ICD-10-CM | POA: Diagnosis not present

## 2021-06-21 DIAGNOSIS — Z299 Encounter for prophylactic measures, unspecified: Secondary | ICD-10-CM | POA: Diagnosis not present

## 2021-06-21 DIAGNOSIS — M79605 Pain in left leg: Secondary | ICD-10-CM | POA: Diagnosis not present

## 2021-06-21 DIAGNOSIS — I10 Essential (primary) hypertension: Secondary | ICD-10-CM | POA: Diagnosis not present

## 2021-06-21 DIAGNOSIS — K449 Diaphragmatic hernia without obstruction or gangrene: Secondary | ICD-10-CM | POA: Diagnosis not present

## 2021-06-22 ENCOUNTER — Encounter (INDEPENDENT_AMBULATORY_CARE_PROVIDER_SITE_OTHER): Payer: Self-pay | Admitting: *Deleted

## 2021-07-05 DIAGNOSIS — I10 Essential (primary) hypertension: Secondary | ICD-10-CM | POA: Diagnosis not present

## 2021-07-11 ENCOUNTER — Encounter (INDEPENDENT_AMBULATORY_CARE_PROVIDER_SITE_OTHER): Payer: Self-pay | Admitting: Gastroenterology

## 2021-07-11 ENCOUNTER — Ambulatory Visit (INDEPENDENT_AMBULATORY_CARE_PROVIDER_SITE_OTHER): Payer: Medicare Other | Admitting: Gastroenterology

## 2021-07-11 VITALS — BP 130/63 | HR 75 | Temp 98.2°F | Ht 66.0 in | Wt 134.4 lb

## 2021-07-11 DIAGNOSIS — K219 Gastro-esophageal reflux disease without esophagitis: Secondary | ICD-10-CM | POA: Diagnosis not present

## 2021-07-11 DIAGNOSIS — I10 Essential (primary) hypertension: Secondary | ICD-10-CM | POA: Diagnosis not present

## 2021-07-11 DIAGNOSIS — Z299 Encounter for prophylactic measures, unspecified: Secondary | ICD-10-CM | POA: Diagnosis not present

## 2021-07-11 DIAGNOSIS — Z789 Other specified health status: Secondary | ICD-10-CM | POA: Diagnosis not present

## 2021-07-11 DIAGNOSIS — Z681 Body mass index (BMI) 19 or less, adult: Secondary | ICD-10-CM | POA: Diagnosis not present

## 2021-07-11 DIAGNOSIS — R1013 Epigastric pain: Secondary | ICD-10-CM

## 2021-07-11 DIAGNOSIS — Z0000 Encounter for general adult medical examination without abnormal findings: Secondary | ICD-10-CM | POA: Diagnosis not present

## 2021-07-11 MED ORDER — FAMOTIDINE 20 MG PO TABS: 20.0000 mg | ORAL_TABLET | Freq: Every day | ORAL | 2 refills | Status: DC

## 2021-07-11 NOTE — Patient Instructions (Signed)
Please continue omeprazole 20mg  once daily, take this 30 minutes prior to breakfast in the morning Make sure you are staying upright 2-3 hours after eating, prior to lying down Try to avoid heavier meals later in the day, as well as greasy, spicy, tomato based foods We will start famotidine 20mg , take this in the evening prior to bed  I would like to see you back in 4-6 weeks to see how symptoms are doing, we can do a virtual or telephone visit at that time if it is easier for you.

## 2021-07-11 NOTE — Progress Notes (Unsigned)
Referring Provider: Kirstie Peri, MD Primary Care Physician:  Kirstie Peri, MD Primary GI Physician: Levon Hedger  Chief Complaint  Patient presents with   Hernia    Patient arrives with son Dawn Estrada to follow up on hernia. Starting to have pain in abdomen and spits up some after eating now. Appetite is fine, has one to two stools per day.    HPI:   Dawn Estrada is a 88 y.o. female with past medical history of hiatal hernia, hypertension, anxiety and arthritis  Patient presenting today for epigastric pain/regurgitation.  History: Last seen April 2022, for hospital follow up of n/v/ chest discomfort, at that time she reported continued chest discomfort and some discomfort in upper abdomen. Omeprazole was increased to 40mg  daily at that time with zofran PRN   Present:  Patient states that she has been having some chest discomfort for the past 2-3 weeks, she reports that she will notice that she feels a throbbing like sensation in her chest that lasts for a few minutes, pain will resolve on its own or she will take a xanax. She sometimes has some radiation of pain to her L arm, but denies radiation of pain elsewhere else. Denies sob. She does endorse that she sometimes feels that she is "too full" will belch and note "water" in her throat. She denies any heartburn. She does endorse some episodes of regurgitation, usually of liquids. Does not seem to matter what she eats. Denies issues with dysphagia or odynophagia. Has some occasional soreness in her lower abdominal soreness. Her son states that she has rolaids that she takes, patient is unable to pinpoint if this helps her symptoms. Weight is good, up 7 pounds since last visit in April 2022. She feels that appetite is stable.   Last Colonoscopy:  Last Endoscopy:  Recommendations:    Past Medical History:  Diagnosis Date   Anxiety    Arthritis    Hiatal hernia    Hypertension    Reflux     Past Surgical History:  Procedure  Laterality Date   ABDOMINAL HYSTERECTOMY     ABDOMINAL SURGERY     resection   APPENDECTOMY     HEMORROIDECTOMY     TONSILLECTOMY      Current Outpatient Medications  Medication Sig Dispense Refill   albuterol (VENTOLIN HFA) 108 (90 Base) MCG/ACT inhaler Inhale 1 puff into the lungs every 4 (four) hours as needed.     ALPRAZolam (XANAX) 0.5 MG tablet Take 0.5 mg by mouth 2 (two) times daily as needed. Take 1/2 tablet by mouth twice a day as needed for anxiety     amLODipine (NORVASC) 2.5 MG tablet Take 2.5 mg by mouth 2 (two) times daily.     Cholecalciferol (VITAMIN D3) 400 units CAPS Take 250 mcg by mouth daily.  7   hydrochlorothiazide (HYDRODIURIL) 25 MG tablet Take 12.5 mg by mouth as directed. Tuesday & Friday     metoprolol tartrate (LOPRESSOR) 25 MG tablet TAKE 1 AND 1/2 TABLETS BY MOUTH TWICE DAILY 270 tablet 1   Multiple Vitamin (MULTIVITAMIN) tablet Take 1 tablet by mouth daily.     omeprazole (PRILOSEC) 20 MG capsule Take 20 mg by mouth daily.     potassium chloride (KLOR-CON) 10 MEQ tablet Take 10 mEq by mouth daily.     vitamin C (ASCORBIC ACID) 500 MG tablet Take 500 mg by mouth daily. One chewable once daily     VYZULTA 0.024 % SOLN Apply 1 drop to eye  at bedtime.     No current facility-administered medications for this visit.    Allergies as of 07/11/2021 - Review Complete 07/11/2021  Allergen Reaction Noted   Diclofenac sodium  11/02/2020   Penicillins Swelling 11/20/2014    Family History  Problem Relation Age of Onset   Arthritis Mother    Myasthenia gravis Sister    Cancer Brother    Hypertension Sister    Diabetes Sister     Social History   Socioeconomic History   Marital status: Widowed    Spouse name: Not on file   Number of children: Not on file   Years of education: Not on file   Highest education level: Not on file  Occupational History   Not on file  Tobacco Use   Smoking status: Never    Passive exposure: Never   Smokeless tobacco:  Never  Vaping Use   Vaping Use: Never used  Substance and Sexual Activity   Alcohol use: No   Drug use: Never   Sexual activity: Not on file  Other Topics Concern   Not on file  Social History Narrative   Not on file   Social Determinants of Health   Financial Resource Strain: Not on file  Food Insecurity: Not on file  Transportation Needs: Not on file  Physical Activity: Not on file  Stress: Not on file  Social Connections: Not on file    Review of systems General: negative for malaise, night sweats, fever, chills, weight los Neck: Negative for lumps, goiter, pain and significant neck swelling Resp: Negative for cough, wheezing, dyspnea at rest CV: Negative for chest pain, leg swelling, palpitations, orthopnea GI: denies melena, hematochezia, nausea, vomiting, diarrhea, constipation, dysphagia, odyonophagia, early satiety or unintentional weight loss.  MSK: Negative for joint pain or swelling, back pain, and muscle pain. Derm: Negative for itching or rash Psych: Denies depression, anxiety, memory loss, confusion. No homicidal or suicidal ideation.  Heme: Negative for prolonged bleeding, bruising easily, and swollen nodes. Endocrine: Negative for cold or heat intolerance, polyuria, polydipsia and goiter. Neuro: negative for tremor, gait imbalance, syncope and seizures. The remainder of the review of systems is noncontributory.  Physical Exam: BP 130/63 (BP Location: Right Arm, Patient Position: Sitting, Cuff Size: Normal)   Pulse 75   Temp 98.2 F (36.8 C) (Oral)   Ht 5\' 6"  (1.676 m)   Wt 134 lb 6.4 oz (61 kg)   BMI 21.69 kg/m  General:   Alert and oriented. No distress noted. Pleasant and cooperative.  Head:  Normocephalic and atraumatic. Eyes:  Conjuctiva clear without scleral icterus. Mouth:  Oral mucosa pink and moist. Good dentition. No lesions. Heart: Normal rate and rhythm, s1 and s2 heart sounds present.  Lungs: Clear lung sounds in all lobes. Respirations  equal and unlabored. Abdomen:  +BS, soft, non-tender and non-distended. No rebound or guarding. No HSM or masses noted. Derm: No palmar erythema or jaundice Msk:  Symmetrical without gross deformities. Normal posture. Extremities:  Without edema. Neurologic:  Alert and  oriented x4 Psych:  Alert and cooperative. Normal mood and affect.  Invalid input(s): 6 MONTHS   ASSESSMENT: TANGANYIKA BOWLDS is a 88 y.o. female presenting today epigastric pain and regurgitation.  Patient reports epigastric pain, especially when lying down, also with frequent acid/food regurgitation. Notably, she did not increase her PPI at last visit as her son reports she was nervous about side effects. Patient and son wer re-assured regarding PPI use and safety of when appropriate  indications in question. Most recent studies on PPI therapy that association of symptoms is not equivalent to causation and overall association with for example osteoporosis is weak and based on observational studies. When PPI use is indicated, it is safe to proceed with therapy and titrate dosing/use based on symptom response. Given that patient has presented with no red flag symptoms and is of advanced age, we will hold off on endoscopic evaluation, as I suspect that symptoms are likely related to uncontrolled GERD, could be influenced by known hiatal hernia, though appetite is good, she has gained 7 pounds since her last visit in April 2022. Will continue with omeprazole 20mg  daily and add famotidine 20mg  QHS, I discussed the importance of avoiding greasy, spicy, tomato based foods, avoiding heavier meals in the evening and staying upright 2-3 hours after eating.    PLAN:  Continue omeprazole 20mg  2. Start famotidine 20mg  QHS  3. Reflux precautions  All questions were answered, patient verbalized understanding and is in agreement with plan as outlined above.   Follow Up: 1 month  Piper Albro L. Jeanmarie Hubertarlan, MSN, APRN, AGNP-C Adult-Gerontology  Nurse Practitioner [facility]

## 2021-07-12 DIAGNOSIS — R1013 Epigastric pain: Secondary | ICD-10-CM | POA: Insufficient documentation

## 2021-07-13 ENCOUNTER — Other Ambulatory Visit: Payer: Self-pay | Admitting: *Deleted

## 2021-07-13 MED ORDER — FAMOTIDINE 20 MG PO TABS: 20.0000 mg | ORAL_TABLET | Freq: Every day | ORAL | 2 refills | Status: DC

## 2021-07-17 DIAGNOSIS — R2681 Unsteadiness on feet: Secondary | ICD-10-CM | POA: Diagnosis not present

## 2021-07-17 DIAGNOSIS — R296 Repeated falls: Secondary | ICD-10-CM | POA: Diagnosis not present

## 2021-07-17 DIAGNOSIS — M129 Arthropathy, unspecified: Secondary | ICD-10-CM | POA: Diagnosis not present

## 2021-08-01 DIAGNOSIS — I80203 Phlebitis and thrombophlebitis of unspecified deep vessels of lower extremities, bilateral: Secondary | ICD-10-CM | POA: Diagnosis not present

## 2021-08-01 DIAGNOSIS — I809 Phlebitis and thrombophlebitis of unspecified site: Secondary | ICD-10-CM | POA: Diagnosis not present

## 2021-08-04 DIAGNOSIS — I10 Essential (primary) hypertension: Secondary | ICD-10-CM | POA: Diagnosis not present

## 2021-08-16 DIAGNOSIS — M129 Arthropathy, unspecified: Secondary | ICD-10-CM | POA: Diagnosis not present

## 2021-08-16 DIAGNOSIS — R296 Repeated falls: Secondary | ICD-10-CM | POA: Diagnosis not present

## 2021-08-16 DIAGNOSIS — R2681 Unsteadiness on feet: Secondary | ICD-10-CM | POA: Diagnosis not present

## 2021-08-23 ENCOUNTER — Ambulatory Visit (INDEPENDENT_AMBULATORY_CARE_PROVIDER_SITE_OTHER): Payer: Medicare Other | Admitting: Gastroenterology

## 2021-08-23 ENCOUNTER — Telehealth (INDEPENDENT_AMBULATORY_CARE_PROVIDER_SITE_OTHER): Payer: Self-pay | Admitting: *Deleted

## 2021-08-23 DIAGNOSIS — K219 Gastro-esophageal reflux disease without esophagitis: Secondary | ICD-10-CM

## 2021-08-23 DIAGNOSIS — Z299 Encounter for prophylactic measures, unspecified: Secondary | ICD-10-CM | POA: Diagnosis not present

## 2021-08-23 DIAGNOSIS — I10 Essential (primary) hypertension: Secondary | ICD-10-CM | POA: Diagnosis not present

## 2021-08-23 DIAGNOSIS — M549 Dorsalgia, unspecified: Secondary | ICD-10-CM | POA: Diagnosis not present

## 2021-08-23 DIAGNOSIS — R1013 Epigastric pain: Secondary | ICD-10-CM | POA: Diagnosis not present

## 2021-08-23 DIAGNOSIS — Z681 Body mass index (BMI) 19 or less, adult: Secondary | ICD-10-CM | POA: Diagnosis not present

## 2021-08-23 DIAGNOSIS — I471 Supraventricular tachycardia: Secondary | ICD-10-CM | POA: Diagnosis not present

## 2021-08-23 MED ORDER — SUCRALFATE 1 GM/10ML PO SUSP: 1.0000 g | Freq: Four times a day (QID) | ORAL | 2 refills | Status: DC

## 2021-08-23 NOTE — Progress Notes (Signed)
Primary Care Physician:  Kirstie Peri, MD  Primary GI: Levon Hedger  Patient Location: Home   Provider Location: [facility]   Reason for Visit: follow up of chest pain   Persons present on the virtual encounter, with roles: Coltan Spinello L. Jeanmarie Hubert, MSN, APRN, AGNP-C, Gwenlyn Fudge, patient, [name], patient's Son   Total time (minutes) spent on medical discussion:  minutes  Virtual Visit via Telephone visit is conducted virtually and was requested by patient.   I connected with Cristela Felt on 08/23/21 at  3:30 PM EDT by telephone and verified that I am speaking with the correct person using two identifiers.   I discussed the limitations, risks, security and privacy concerns of performing an evaluation and management service by telephone and the availability of in person appointments. I also discussed with the patient that there may be a patient responsible charge related to this service. The patient expressed understanding and agreed to proceed.  Chief Complaint  Patient presents with   Gastroesophageal Reflux    Virtual appointment for a 6 week follow up on GERD. Tried famotidine and states it made her heart race and she stopped taking med. Reflux is about the same as when seen.    History of Present Illness: Dawn Estrada is a 88 y.o. female with past medical history of hiatal hernia, hypertension, anxiety and arthritis.  History: Last seen 07/11/21, at that time having some chest discomfort for the past 2-3 weeks, feeling a throbbing like sensation in her chest that lasts for a few minutes, pain will resolve on its own or she will take a xanax. She has seen cardiology who ruled out cardiac etiology. She sometimes has some radiation of pain to her L arm, but denies radiation of pain elsewhere else. Denies sob. She does endorse that she sometimes feels that she is "too full" will belch and note "water" in her throat. She denies any heartburn. She does endorse some  episodes of regurgitation, usually of liquids. Does not seem to matter what she eats. Denies issues with dysphagia or odynophagia. Has some occasional soreness in her lower abdomen. Her son states that she has rolaids that she takes often, patient is unable to pinpoint if this helps her symptoms. Weight good, up 7 pounds since last visit in April 2022. feels that appetite is stable. Denies any dysphagia or odynophagia. Notably patient did not increase her PPI dose as recommended at last visit, her son tells me that she read the side effects on the included handout and was concerned about increasing the dose, therefore, she is continued on Omeprazole 20mg  daily. Son also reports that pt often eats and goes and lies down in her recliner shortly thereafter.   Famotidine 20mg  added on QHS, continued on omeprazole 20mg  daily and discussed implementing reflux precautions.  Present:  Son states that patient stopped taking famotidine as it made her feel like she was having palpitations, she also stopped the omeprazole after her son tried increasing it to BID as she felt that the omeprazole made her feel nauseated so went back to once daily PPI dosing. She is having episodes of epigastric discomfort maybe 2-3 times per week. She is not able to pinpoint what triggers her discomfort. Appetite is good and son reports she is eating very well, no weight loss. She is having some regurgitation with belching, usually liquid coming up in her throat, this occurs maybe 2-3x/week. She does not eat spicy foods, sometimes symptoms will occur with greasier  foods. Denies radiation of pain from epigastric area. Denies dysphagia or odynophagia. She does sometimes wake up at night with a palpitation like feeling and some throbbing in her chest/upper abdomen. She did see PCP who increased her potassium to per day, K+ was 3.7 per her son.   No red flag symptoms. Patient denies melena, nausea, vomiting, hematochezia,  diarrhea, constipation, dysphagia, odyonophagia, early satiety or weight loss.   Past Medical History:  Diagnosis Date   Anxiety    Arthritis    Hiatal hernia    Hypertension    Reflux      Past Surgical History:  Procedure Laterality Date   ABDOMINAL HYSTERECTOMY     ABDOMINAL SURGERY     resection   APPENDECTOMY     HEMORROIDECTOMY     TONSILLECTOMY       Current Meds  Medication Sig   albuterol (VENTOLIN HFA) 108 (90 Base) MCG/ACT inhaler Inhale 1 puff into the lungs every 4 (four) hours as needed.   ALPRAZolam (XANAX) 0.5 MG tablet Take 0.5 mg by mouth 2 (two) times daily as needed. Take 1/2 tablet by mouth twice a day as needed for anxiety   amLODipine (NORVASC) 2.5 MG tablet Take 2.5 mg by mouth 2 (two) times daily.   Cholecalciferol (VITAMIN D3) 400 units CAPS Take 250 mcg by mouth daily.   hydrochlorothiazide (HYDRODIURIL) 25 MG tablet Take 12.5 mg by mouth as directed. Tuesday & Friday   metoprolol tartrate (LOPRESSOR) 25 MG tablet TAKE 1 AND 1/2 TABLETS BY MOUTH TWICE DAILY   Multiple Vitamin (MULTIVITAMIN) tablet Take 1 tablet by mouth daily.   omeprazole (PRILOSEC) 20 MG capsule Take 20 mg by mouth daily.   potassium chloride (KLOR-CON) 10 MEQ tablet Take 10 mEq by mouth daily.   vitamin C (ASCORBIC ACID) 500 MG tablet Take 500 mg by mouth daily. One chewable once daily   VYZULTA 0.024 % SOLN Apply 1 drop to eye at bedtime.     Family History  Problem Relation Age of Onset   Arthritis Mother    Myasthenia gravis Sister    Cancer Brother    Hypertension Sister    Diabetes Sister     Social History   Socioeconomic History   Marital status: Widowed    Spouse name: Not on file   Number of children: Not on file   Years of education: Not on file   Highest education level: Not on file  Occupational History   Not on file  Tobacco Use   Smoking status: Never    Passive exposure: Never   Smokeless tobacco: Never  Vaping Use   Vaping Use: Never used   Substance and Sexual Activity   Alcohol use: No   Drug use: Never   Sexual activity: Not on file  Other Topics Concern   Not on file  Social History Narrative   Not on file   Social Determinants of Health   Financial Resource Strain: Not on file  Food Insecurity: Not on file  Transportation Needs: Not on file  Physical Activity: Not on file  Stress: Not on file  Social Connections: Not on file   Review of Systems: Gen: Denies fever, chills, anorexia. Denies fatigue, weakness, weight loss.  CV: Denies chest pain, palpitations, syncope, peripheral edema, and claudication. Resp: Denies dyspnea at rest, cough, wheezing, coughing up blood, and pleurisy. GI: see HPI Derm: Denies rash, itching, dry skin Psych: Denies depression, anxiety, memory loss, confusion. No homicidal or suicidal ideation.  Heme: Denies bruising, bleeding, and enlarged lymph nodes.  Observations/Objective: No distress. Unable to perform physical exam due to telephone encounter. No video available.   Assessment and Plan: SRIJA SOUTHARD is a 88 y.o. female with past medical history of hiatal hernia, hypertension, anxiety and arthritis.  Patient did not tolerate H2B or PPI dosing BID, continued on omeprazole 20mg  one daily, continues to have epigastric discomfort, belching and some regurgitation about 2-3x/week. Appetite is good. No nausea, vomiting, early satiety, weight loss, or post prandial pain. Cannot pinpoint any specific triggers of her symptoms. Denies radiation of pain. No dysphagia or odynophagia. Discussed further evaluation of symptoms via EGD, however, given lack of red flag symptoms and advanced age, risk may actually outweigh the benefits of her undergoing this procedure, which patient and her son agree with and wish to hold off on pursing at this time. Will try Carafate 1g QID to see if this helps with her symptoms, which I suspect are secondary to uncontrolled GERD/possible gastritis. Should  continue with reflux precautions Avoiding greasy, spicy, fried, citrus foods, caffeine, carbonated drinks, and chocolate. Stay upright 2-3 hours after eating, prior to lying down and avoid eating late in the evenings.and PPI once daily.   Reflux precautions Rx Carafate 1g QID  Continue omeprazole 20mg  daily Pt should keep track of symptoms, what foods she ate prior to this to determine if dietary correlation present Avoid NSAIDs  Follow Up Instructions: 2 months   I discussed the assessment and treatment plan with the patient. The patient was provided an opportunity to ask questions and all were answered. The patient agreed with the plan and demonstrated an understanding of the instructions.   The patient was advised to call back or seek an in-person evaluation if the symptoms worsen or if the condition fails to improve as anticipated.  I provided 15 minutes of NON face-to-face time during this Telephone encounter.  Landi Biscardi L. , MSN, APRN, AGNP-C Adult-Gerontology Nurse Practitioner [facility]

## 2021-08-23 NOTE — Patient Instructions (Addendum)
Please continue omeprazole 20mg  once daily Will add carafate 1g to take before meals and at bedtime, if this works, please let me know and I will send a refill for you. Avoid greasy, spicy, fried, citrus foods, and be mindful that caffeine, carbonated drinks, chocolate and alcohol can increase reflux symptoms Stay upright 2-3 hours after eating, prior to lying down and avoid eating late in the evenings. Please write down foods you had prior to symptoms starting, next time this occurs Please avoid NSAIDs (advil, aleve, naproxen, goody powder, ibuprofen) as these can be very hard on your GI tract, causing inflammation, ulcers and damage to the lining of your GI tract.   Follow up in 2 months

## 2021-08-23 NOTE — Telephone Encounter (Signed)
Error

## 2021-09-05 DIAGNOSIS — I10 Essential (primary) hypertension: Secondary | ICD-10-CM | POA: Diagnosis not present

## 2021-09-05 DIAGNOSIS — G2581 Restless legs syndrome: Secondary | ICD-10-CM | POA: Diagnosis not present

## 2021-09-05 DIAGNOSIS — E785 Hyperlipidemia, unspecified: Secondary | ICD-10-CM | POA: Diagnosis not present

## 2021-09-05 DIAGNOSIS — J449 Chronic obstructive pulmonary disease, unspecified: Secondary | ICD-10-CM | POA: Diagnosis not present

## 2021-09-05 DIAGNOSIS — N183 Chronic kidney disease, stage 3 unspecified: Secondary | ICD-10-CM | POA: Diagnosis not present

## 2021-09-08 ENCOUNTER — Other Ambulatory Visit: Payer: Self-pay | Admitting: Cardiology

## 2021-09-16 DIAGNOSIS — R2681 Unsteadiness on feet: Secondary | ICD-10-CM | POA: Diagnosis not present

## 2021-09-16 DIAGNOSIS — R296 Repeated falls: Secondary | ICD-10-CM | POA: Diagnosis not present

## 2021-09-16 DIAGNOSIS — M129 Arthropathy, unspecified: Secondary | ICD-10-CM | POA: Diagnosis not present

## 2021-09-22 DIAGNOSIS — R350 Frequency of micturition: Secondary | ICD-10-CM | POA: Diagnosis not present

## 2021-10-05 DIAGNOSIS — I10 Essential (primary) hypertension: Secondary | ICD-10-CM | POA: Diagnosis not present

## 2021-10-07 DIAGNOSIS — Z299 Encounter for prophylactic measures, unspecified: Secondary | ICD-10-CM | POA: Diagnosis not present

## 2021-10-07 DIAGNOSIS — I10 Essential (primary) hypertension: Secondary | ICD-10-CM | POA: Diagnosis not present

## 2021-10-07 DIAGNOSIS — R079 Chest pain, unspecified: Secondary | ICD-10-CM | POA: Diagnosis not present

## 2021-10-12 DIAGNOSIS — I10 Essential (primary) hypertension: Secondary | ICD-10-CM | POA: Diagnosis not present

## 2021-10-12 DIAGNOSIS — Z299 Encounter for prophylactic measures, unspecified: Secondary | ICD-10-CM | POA: Diagnosis not present

## 2021-10-12 DIAGNOSIS — M7712 Lateral epicondylitis, left elbow: Secondary | ICD-10-CM | POA: Diagnosis not present

## 2021-10-12 DIAGNOSIS — Z681 Body mass index (BMI) 19 or less, adult: Secondary | ICD-10-CM | POA: Diagnosis not present

## 2021-10-12 DIAGNOSIS — Z23 Encounter for immunization: Secondary | ICD-10-CM | POA: Diagnosis not present

## 2021-10-12 DIAGNOSIS — K219 Gastro-esophageal reflux disease without esophagitis: Secondary | ICD-10-CM | POA: Diagnosis not present

## 2021-10-12 DIAGNOSIS — L309 Dermatitis, unspecified: Secondary | ICD-10-CM | POA: Diagnosis not present

## 2021-10-25 ENCOUNTER — Telehealth (INDEPENDENT_AMBULATORY_CARE_PROVIDER_SITE_OTHER): Payer: Self-pay | Admitting: *Deleted

## 2021-10-25 ENCOUNTER — Ambulatory Visit (INDEPENDENT_AMBULATORY_CARE_PROVIDER_SITE_OTHER): Payer: Medicare Other | Admitting: Gastroenterology

## 2021-10-25 DIAGNOSIS — K219 Gastro-esophageal reflux disease without esophagitis: Secondary | ICD-10-CM

## 2021-10-25 MED ORDER — OMEPRAZOLE 20 MG PO CPDR: 20.0000 mg | DELAYED_RELEASE_CAPSULE | Freq: Every day | ORAL | 3 refills | Status: DC

## 2021-10-25 NOTE — Progress Notes (Signed)
Primary Care Physician:  Monico Blitz, MD  Primary GI: [facility]   Patient Location: Home   Provider Location: [facility]   Reason for Visit: follow up of GERD/regurgitation   Persons present on the virtual encounter, with roles: Yalexa Blust L. Alver Sorrow, MSN, APRN, AGNP-C, Colette Ribas, patient, Legrand Como, patient's Son   Total time (minutes) spent on medical discussion: 8  minutes  Virtual Visit via Telephone visit is conducted virtually and was requested by patient.   I connected with Kathyrn Lass on 10/25/21 at  4:00 PM EDT by telephone and verified that I am speaking with the correct person using two identifiers.   I discussed the limitations, risks, security and privacy concerns of performing an evaluation and management service by telephone and the availability of in person appointments. I also discussed with the patient that there may be a patient responsible charge related to this service. The patient expressed understanding and agreed to proceed.  Chief Complaint  Patient presents with   Gastroesophageal Reflux    Patient doing virtual visit with son Simona Huh. Some days she spits up. Stopped carafate due to it making her feel weak and lightheaded.    History of Present Illness: RAYN SHORB is a 88 y.o. female with past medical history of hiatal hernia, hypertension, anxiety and arthritis.  At last visit in July, patient stopped famotidine as it made her feel like having palpitations, tried increasing omeprazole to BID but caused her nausea. Appetite good, no weight loss, having some regurgitation and belching 2-3x/week. No radiation of pain, no dysphagia or odynophagia.   Recommended reflux precautions, Rx carafate 1g QID, continue omeprazole 20mg  daily, food journal, avoid NSAIDs.  Present:  Patient presents with son Legrand Como, who also helps provide some history. Patient states that symptoms are doing "pretty good at times" she does note that if she eats a  larger meal than she should, she will have some acid/liquid regurgitation, notes she does not regurgitate up food. No nausea or vomiting. This occurs 1-2x/week. She notes feeling full at times where she ate too much. Appetite is decent. States she is not sure what foods sound good to her. She has no abdominal pain, has occasional chest pain. She states she stopped taking carafate as this made her feel really weak. She is still taking daily omeprazole 20mg  once a day. Her son Legrand Como states that some days she is fine and other days, maybe 2-3x/week she does not seem to do as well, has regurgitation. He states she is eating well for the most part, if she is having more regurgitation she will eat lighter for a few days which will help. Has occasional diarrhea which she takes pepto bismol  for this with good results.   No red flag symptoms. Patient denies melena, hematochezia, nausea, vomiting, constipation, dysphagia, odyonophagia, early satiety or weight loss.    Past Medical History:  Diagnosis Date   Anxiety    Arthritis    Hiatal hernia    Hypertension    Reflux      Past Surgical History:  Procedure Laterality Date   ABDOMINAL HYSTERECTOMY     ABDOMINAL SURGERY     resection   APPENDECTOMY     HEMORROIDECTOMY     TONSILLECTOMY       Current Meds  Medication Sig   albuterol (VENTOLIN HFA) 108 (90 Base) MCG/ACT inhaler Inhale 1 puff into the lungs every 4 (four) hours as needed.   ALPRAZolam (XANAX) 0.5 MG tablet Take  0.5 mg by mouth 2 (two) times daily as needed. Take 1/2 tablet by mouth twice a day as needed for anxiety   amLODipine (NORVASC) 2.5 MG tablet Take 2.5 mg by mouth 2 (two) times daily.   Cholecalciferol (VITAMIN D3) 400 units CAPS Take 250 mcg by mouth daily.   hydrochlorothiazide (HYDRODIURIL) 25 MG tablet Take 12.5 mg by mouth as directed. Tuesday & Friday   metoprolol tartrate (LOPRESSOR) 25 MG tablet TAKE 1 AND 1/2 TABLETS BY MOUTH TWICE DAILY   Multiple Vitamin  (MULTIVITAMIN) tablet Take 1 tablet by mouth daily.   omeprazole (PRILOSEC) 20 MG capsule Take 20 mg by mouth daily.   potassium chloride (KLOR-CON) 10 MEQ tablet Take 10 mEq by mouth daily.   vitamin C (ASCORBIC ACID) 500 MG tablet Take 500 mg by mouth daily. One chewable once daily   VYZULTA 0.024 % SOLN Apply 1 drop to eye at bedtime.     Family History  Problem Relation Age of Onset   Arthritis Mother    Myasthenia gravis Sister    Cancer Brother    Hypertension Sister    Diabetes Sister     Social History   Socioeconomic History   Marital status: Widowed    Spouse name: Not on file   Number of children: Not on file   Years of education: Not on file   Highest education level: Not on file  Occupational History   Not on file  Tobacco Use   Smoking status: Never    Passive exposure: Never   Smokeless tobacco: Never  Vaping Use   Vaping Use: Never used  Substance and Sexual Activity   Alcohol use: No   Drug use: Never   Sexual activity: Not on file  Other Topics Concern   Not on file  Social History Narrative   Not on file   Social Determinants of Health   Financial Resource Strain: Not on file  Food Insecurity: Not on file  Transportation Needs: Not on file  Physical Activity: Not on file  Stress: Not on file  Social Connections: Not on file   Review of Systems: Gen: Denies fever, chills, anorexia. Denies fatigue, weakness, weight loss.  CV: Denies chest pain, palpitations, syncope, peripheral edema, and claudication. Resp: Denies dyspnea at rest, cough, wheezing, coughing up blood, and pleurisy. GI: see HPI Derm: Denies rash, itching, dry skin Psych: Denies depression, anxiety, memory loss, confusion. No homicidal or suicidal ideation.  Heme: Denies bruising, bleeding, and enlarged lymph nodes.  Observations/Objective: No distress. Unable to perform physical exam due to telephone encounter. No video available.   Assessment and Plan: TANGANIKA BARRADAS  is a 88 y.o. female who presents via telephone visit for follow up of GERD/regurgitation.  Patient continued on omeprazole 20mg  daily, stopped previously prescribed carafate due to this causing weakness, did not tolerate famotidine in the past due to palpitations and did not tolerate higher dose of PPI. We have discussed the possibility of endoscopic evaluation in the past but ultimately all parties to include myself felt Risks would outweigh the benefits at her age with given multi morbidities, we again discussed EGD but are all still in agreement with holding off on this given the potential risks, as well as lack of red flag symptoms. She is having regurgitation of acid/liquid 2-3x/week but no nausea or vomiting. Appetite is decent, she has had no weight loss. At this time, will plan to continue with low dose PPI, she will make me aware if she  has new or worsening symptoms. Recommend 3-4 smaller meals per day, reflux precautions.   -continue omeprazole 20mg  daily -reflux precautions -3-4 smaller meals per day -pt/son to make me aware of new or worsening symptoms    Follow Up Instructions: Follow up 6 months   I discussed the assessment and treatment plan with the patient and her Son . The patient and her son were provided an opportunity to ask questions and all were answered. Patient and her son agreed with the plan and demonstrated an understanding of the instructions.   The patient was advised to call back or seek an in-person evaluation if the symptoms worsen or if the condition fails to improve as anticipated.  I provided 8 minutes of  NON face-to-face time during this telephone encounter.  Khala Tarte L. [name], MSN, APRN, AGNP-C Adult-Gerontology Nurse Practitioner [facility]

## 2021-10-25 NOTE — Patient Instructions (Signed)
-  continue omeprazole 20mg  daily  -Avoid greasy, spicy, fried, citrus foods, and be mindful that caffeine, carbonated drinks, chocolate and alcohol can increase reflux symptoms. Stay upright 2-3 hours after eating, prior to lying down and avoid eating late in the evenings.  -3-4 smaller meals per day   Please make me aware of new or worsening symptoms  Follow up 6 months

## 2021-10-27 NOTE — Telephone Encounter (Signed)
error 

## 2021-11-02 ENCOUNTER — Ambulatory Visit: Payer: Medicare Other | Attending: Cardiology | Admitting: Cardiology

## 2021-11-02 ENCOUNTER — Encounter: Payer: Self-pay | Admitting: Cardiology

## 2021-11-02 VITALS — BP 128/82 | HR 64 | Wt 129.6 lb

## 2021-11-02 DIAGNOSIS — R002 Palpitations: Secondary | ICD-10-CM | POA: Diagnosis not present

## 2021-11-02 DIAGNOSIS — I10 Essential (primary) hypertension: Secondary | ICD-10-CM

## 2021-11-02 MED ORDER — METOPROLOL TARTRATE 50 MG PO TABS: 50.0000 mg | ORAL_TABLET | Freq: Two times a day (BID) | ORAL | 6 refills | Status: DC

## 2021-11-02 NOTE — Progress Notes (Signed)
Clinical Summary Ms. Hatzis is a 88 y.o.female  Palpitations - Prior event monitoring demonstrated sinus rhythm with PACs and PVCs and PSVT, the longest lasting 8 beats.    - some recent palpitations, feeling of quivering. Overall tolerable.     - trembling like feeling in chest, can last about 1 hour. No other associated symptoms. Can wake her up at night.     2. HTN - compliant with meds     3. CHest pain - has been thought to be GI related, improves with belching - no recent symptosm     4. LE edema - some chronic LE edema, overal stable since last visit   Past Medical History:  Diagnosis Date   Anxiety    Arthritis    Hiatal hernia    Hypertension    Reflux      Allergies  Allergen Reactions   Diclofenac Sodium    Penicillins Swelling    Has patient had a PCN reaction causing immediate rash, facial/tongue/throat swelling, SOB or lightheadedness with hypotension: NO Has patient had a PCN reaction causing severe rash involving mucus membranes or skin necrosis: no Has patient had a PCN reaction that required hospitalization : no Has patient had a PCN reaction occurring within the last 10 years: no If all of the above answers are "NO", then may proceed with Cephalosporin use.     Current Outpatient Medications  Medication Sig Dispense Refill   albuterol (VENTOLIN HFA) 108 (90 Base) MCG/ACT inhaler Inhale 1 puff into the lungs every 4 (four) hours as needed.     ALPRAZolam (XANAX) 0.5 MG tablet Take 0.5 mg by mouth 2 (two) times daily as needed. Take 1/2 tablet by mouth twice a day as needed for anxiety     amLODipine (NORVASC) 2.5 MG tablet Take 2.5 mg by mouth 2 (two) times daily.     Cholecalciferol (VITAMIN D3) 400 units CAPS Take 250 mcg by mouth daily.  7   hydrochlorothiazide (HYDRODIURIL) 25 MG tablet Take 12.5 mg by mouth as directed. Tuesday & Friday     metoprolol tartrate (LOPRESSOR) 25 MG tablet TAKE 1 AND 1/2 TABLETS BY MOUTH TWICE  DAILY 270 tablet 1   Multiple Vitamin (MULTIVITAMIN) tablet Take 1 tablet by mouth daily.     omeprazole (PRILOSEC) 20 MG capsule Take 1 capsule (20 mg total) by mouth daily. 90 capsule 3   potassium chloride (KLOR-CON) 10 MEQ tablet Take 10 mEq by mouth daily.     vitamin C (ASCORBIC ACID) 500 MG tablet Take 500 mg by mouth daily. One chewable once daily     VYZULTA 0.024 % SOLN Apply 1 drop to eye at bedtime.     No current facility-administered medications for this visit.     Past Surgical History:  Procedure Laterality Date   ABDOMINAL HYSTERECTOMY     ABDOMINAL SURGERY     resection   APPENDECTOMY     HEMORROIDECTOMY     TONSILLECTOMY       Allergies  Allergen Reactions   Diclofenac Sodium    Penicillins Swelling    Has patient had a PCN reaction causing immediate rash, facial/tongue/throat swelling, SOB or lightheadedness with hypotension: NO Has patient had a PCN reaction causing severe rash involving mucus membranes or skin necrosis: no Has patient had a PCN reaction that required hospitalization : no Has patient had a PCN reaction occurring within the last 10 years: no If all of the above answers are "NO",  then may proceed with Cephalosporin use.      Family History  Problem Relation Age of Onset   Arthritis Mother    Myasthenia gravis Sister    Cancer Brother    Hypertension Sister    Diabetes Sister      Social History Ms. Mermelstein reports that she has never smoked. She has never been exposed to tobacco smoke. She has never used smokeless tobacco. Ms. Raymer reports no history of alcohol use.   Review of Systems CONSTITUTIONAL: No weight loss, fever, chills, weakness or fatigue.  HEENT: Eyes: No visual loss, blurred vision, double vision or yellow sclerae.No hearing loss, sneezing, congestion, runny nose or sore throat.  SKIN: No rash or itching.  CARDIOVASCULAR: per hpi RESPIRATORY: No shortness of breath, cough or sputum.  GASTROINTESTINAL:  No anorexia, nausea, vomiting or diarrhea. No abdominal pain or blood.  GENITOURINARY: No burning on urination, no polyuria NEUROLOGICAL: No headache, dizziness, syncope, paralysis, ataxia, numbness or tingling in the extremities. No change in bowel or bladder control.  MUSCULOSKELETAL: No muscle, back pain, joint pain or stiffness.  LYMPHATICS: No enlarged nodes. No history of splenectomy.  PSYCHIATRIC: No history of depression or anxiety.  ENDOCRINOLOGIC: No reports of sweating, cold or heat intolerance. No polyuria or polydipsia.  Marland Kitchen   Physical Examination Today's Vitals   11/02/21 1446  BP: 128/82  Pulse: 64  SpO2: 97%  Weight: 129 lb 9.6 oz (58.8 kg)   Body mass index is 20.92 kg/m.  Gen: resting comfortably, no acute distress HEENT: no scleral icterus, pupils equal round and reactive, no palptable cervical adenopathy,  CV: RRR, no m/r/g, no jvd Resp: Clear to auscultation bilaterally GI: abdomen is soft, non-tender, non-distended, normal bowel sounds, no hepatosplenomegaly MSK: extremities are warm, no edema.  Skin: warm, no rash Neuro:  no focal deficits Psych: appropriate affect    Assessment and Plan   Palpitations - ongoign palpitations, increase lopressor to '50mg'$  bid   2. HTN - she is at goal, continue current meds   Arnoldo Lenis, M.D

## 2021-11-02 NOTE — Patient Instructions (Addendum)
Medication Instructions:  Increase Lopressor to 50mg twice a day   Continue all other medications.     Labwork: none  Testing/Procedures: none  Follow-Up: 6 months   Any Other Special Instructions Will Be Listed Below (If Applicable).   If you need a refill on your cardiac medications before your next appointment, please call your pharmacy.  

## 2021-11-04 DIAGNOSIS — I10 Essential (primary) hypertension: Secondary | ICD-10-CM | POA: Diagnosis not present

## 2021-11-09 ENCOUNTER — Encounter: Payer: Self-pay | Admitting: *Deleted

## 2021-11-16 DIAGNOSIS — N183 Chronic kidney disease, stage 3 unspecified: Secondary | ICD-10-CM | POA: Diagnosis not present

## 2021-11-16 DIAGNOSIS — I10 Essential (primary) hypertension: Secondary | ICD-10-CM | POA: Diagnosis not present

## 2021-11-16 DIAGNOSIS — I700 Atherosclerosis of aorta: Secondary | ICD-10-CM | POA: Diagnosis not present

## 2021-11-17 DIAGNOSIS — Z961 Presence of intraocular lens: Secondary | ICD-10-CM | POA: Diagnosis not present

## 2021-11-17 DIAGNOSIS — H401123 Primary open-angle glaucoma, left eye, severe stage: Secondary | ICD-10-CM | POA: Diagnosis not present

## 2021-11-17 DIAGNOSIS — H532 Diplopia: Secondary | ICD-10-CM | POA: Diagnosis not present

## 2021-11-17 DIAGNOSIS — H524 Presbyopia: Secondary | ICD-10-CM | POA: Diagnosis not present

## 2021-11-17 DIAGNOSIS — H401112 Primary open-angle glaucoma, right eye, moderate stage: Secondary | ICD-10-CM | POA: Diagnosis not present

## 2021-12-05 DIAGNOSIS — I10 Essential (primary) hypertension: Secondary | ICD-10-CM | POA: Diagnosis not present

## 2021-12-15 DIAGNOSIS — N183 Chronic kidney disease, stage 3 unspecified: Secondary | ICD-10-CM | POA: Diagnosis not present

## 2022-01-04 DIAGNOSIS — I10 Essential (primary) hypertension: Secondary | ICD-10-CM | POA: Diagnosis not present

## 2022-01-11 DIAGNOSIS — I10 Essential (primary) hypertension: Secondary | ICD-10-CM | POA: Diagnosis not present

## 2022-01-11 DIAGNOSIS — I700 Atherosclerosis of aorta: Secondary | ICD-10-CM | POA: Diagnosis not present

## 2022-01-11 DIAGNOSIS — E042 Nontoxic multinodular goiter: Secondary | ICD-10-CM | POA: Diagnosis not present

## 2022-01-11 DIAGNOSIS — Z681 Body mass index (BMI) 19 or less, adult: Secondary | ICD-10-CM | POA: Diagnosis not present

## 2022-01-11 DIAGNOSIS — Z299 Encounter for prophylactic measures, unspecified: Secondary | ICD-10-CM | POA: Diagnosis not present

## 2022-01-13 DIAGNOSIS — I10 Essential (primary) hypertension: Secondary | ICD-10-CM | POA: Diagnosis not present

## 2022-02-02 DIAGNOSIS — I10 Essential (primary) hypertension: Secondary | ICD-10-CM | POA: Diagnosis not present

## 2022-02-02 DIAGNOSIS — N183 Chronic kidney disease, stage 3 unspecified: Secondary | ICD-10-CM | POA: Diagnosis not present

## 2022-02-02 DIAGNOSIS — Z713 Dietary counseling and surveillance: Secondary | ICD-10-CM | POA: Diagnosis not present

## 2022-02-02 DIAGNOSIS — Z681 Body mass index (BMI) 19 or less, adult: Secondary | ICD-10-CM | POA: Diagnosis not present

## 2022-02-03 DIAGNOSIS — I10 Essential (primary) hypertension: Secondary | ICD-10-CM | POA: Diagnosis not present

## 2022-03-02 DIAGNOSIS — M79676 Pain in unspecified toe(s): Secondary | ICD-10-CM | POA: Diagnosis not present

## 2022-03-02 DIAGNOSIS — B351 Tinea unguium: Secondary | ICD-10-CM | POA: Diagnosis not present

## 2022-03-06 DIAGNOSIS — I10 Essential (primary) hypertension: Secondary | ICD-10-CM | POA: Diagnosis not present

## 2022-03-07 DIAGNOSIS — I10 Essential (primary) hypertension: Secondary | ICD-10-CM | POA: Diagnosis not present

## 2022-03-07 DIAGNOSIS — N183 Chronic kidney disease, stage 3 unspecified: Secondary | ICD-10-CM | POA: Diagnosis not present

## 2022-04-05 DIAGNOSIS — I10 Essential (primary) hypertension: Secondary | ICD-10-CM | POA: Diagnosis not present

## 2022-04-05 DIAGNOSIS — I700 Atherosclerosis of aorta: Secondary | ICD-10-CM | POA: Diagnosis not present

## 2022-04-05 DIAGNOSIS — I471 Supraventricular tachycardia, unspecified: Secondary | ICD-10-CM | POA: Diagnosis not present

## 2022-04-10 DIAGNOSIS — H401112 Primary open-angle glaucoma, right eye, moderate stage: Secondary | ICD-10-CM | POA: Diagnosis not present

## 2022-04-18 DIAGNOSIS — N183 Chronic kidney disease, stage 3 unspecified: Secondary | ICD-10-CM | POA: Diagnosis not present

## 2022-04-18 DIAGNOSIS — I10 Essential (primary) hypertension: Secondary | ICD-10-CM | POA: Diagnosis not present

## 2022-04-18 DIAGNOSIS — I700 Atherosclerosis of aorta: Secondary | ICD-10-CM | POA: Diagnosis not present

## 2022-04-18 DIAGNOSIS — Z0000 Encounter for general adult medical examination without abnormal findings: Secondary | ICD-10-CM | POA: Diagnosis not present

## 2022-04-18 DIAGNOSIS — Z299 Encounter for prophylactic measures, unspecified: Secondary | ICD-10-CM | POA: Diagnosis not present

## 2022-04-25 ENCOUNTER — Telehealth (INDEPENDENT_AMBULATORY_CARE_PROVIDER_SITE_OTHER): Payer: Self-pay | Admitting: *Deleted

## 2022-04-25 ENCOUNTER — Ambulatory Visit (INDEPENDENT_AMBULATORY_CARE_PROVIDER_SITE_OTHER): Payer: Medicare Other | Admitting: Gastroenterology

## 2022-04-25 VITALS — Wt 134.0 lb

## 2022-04-25 DIAGNOSIS — K219 Gastro-esophageal reflux disease without esophagitis: Secondary | ICD-10-CM

## 2022-04-25 NOTE — Progress Notes (Signed)
Primary Care Physician:  Monico Blitz, MD  Primary GI: [facility] Down[facility]   Patient Location: Home   Provider Location: [facility]   Reason for Visit: follow up of GERD   Persons present on the virtual encounter, with roles: Dawn Mullane L. Alver Sorrow, MSN, APRN, AGNP-C, Dawn Estrada, patient, Dawn Estrada, patient's son   Total time (minutes) spent on medical discussion: 10 minutes  Virtual Visit via telephone  visit is conducted virtually and was requested by patient.   I connected with Dawn Estrada on 04/25/22 at 11:15 AM EDT by telephone and verified that I am speaking with the correct person using two identifiers.   I discussed the limitations, risks, security and privacy concerns of performing an evaluation and management service by telephone and the availability of in person appointments. I also discussed with the patient that there may be a patient responsible charge related to this service. The patient expressed understanding and agreed to proceed.  Chief Complaint  Patient presents with   Gastroesophageal Reflux    Patient doing a virtual visit with son Dawn Estrada. Reports she complains of headache sometimes when taking omeprazole. Son is concerned because about med causing kidney issues. And sometimes liquids come back up when drinking. Complains of abdominal cramping sometimes that goes around to back.    History of Present Illness: Dawn Estrada is a 89 y.o. female with past medical history of hiatal hernia, hypertension, anxiety and arthritis who presents for GERD follow up.  Last seen virtually in September, at that time patient doing pretty good, having some occasional liquid/acid regurgitation 1-2x/week. Appetite good. Taking omeprazole 20mg  daily.   Recommended to continue with omeprazole 20mg  daily, reflux precautions, 3-4 smaller meals per day  Present: Reported weight today is 134 pounds, maintaining her weight well. She notes that  regurgitation has improved. She did decrease omeprazole to 10mg  daily as patient felt PPI was causing her to have a headache. She is not regurgitating any food, occasionally will spit up some liquids, feels it is improved, though still occurring 3-4x/week. Denies heartburn. She has some pain in her back with movement which sometimes radiates to her lower stomach. Denies dysphagia. Appetite is up and down. Son reports she will have an occasional protein shake but usually is just getting nutrition from her meals. Having 2 meals per day. Denies rectal bleeding or melena.   Son reports that BP on recheck today was around [id number] Systolically, they are checking BP regularly. She is maintained on 2 meds for HTN, having some headaches recently. PCP is trying to manage her BP.     Past Medical History:  Diagnosis Date   Anxiety    Arthritis    Hiatal hernia    Hypertension    Reflux     Past Surgical History:  Procedure Laterality Date   ABDOMINAL HYSTERECTOMY     ABDOMINAL SURGERY     resection   APPENDECTOMY     HEMORROIDECTOMY     TONSILLECTOMY       Current Meds  Medication Sig   albuterol (VENTOLIN HFA) 108 (90 Base) MCG/ACT inhaler Inhale 1 puff into the lungs every 4 (four) hours as needed.   ALPRAZolam (XANAX) 0.5 MG tablet Take 0.5 mg by mouth 2 (two) times daily as needed. Take 1/2 tablet by mouth twice a day as needed for anxiety   cetirizine (ZYRTEC) 10 MG tablet Take 10 mg by mouth daily.   Cholecalciferol (VITAMIN D3) 400 units CAPS Take 250 mcg by  mouth daily.   hydrochlorothiazide (HYDRODIURIL) 25 MG tablet Take 12.5 mg by mouth as directed. Tuesday & Friday as needed   metoprolol tartrate (LOPRESSOR) 50 MG tablet Take 1 tablet (50 mg total) by mouth 2 (two) times daily.   Multiple Vitamin (MULTIVITAMIN) tablet Take 1 tablet by mouth daily.   omeprazole (PRILOSEC) 20 MG capsule Take 1 capsule (20 mg total) by mouth daily.   potassium chloride (KLOR-CON) 10 MEQ tablet Take 10  mEq by mouth daily.   timolol (TIMOPTIC) 0.5 % ophthalmic solution 1 drop 2 (two) times daily.   vitamin C (ASCORBIC ACID) 500 MG tablet Take 500 mg by mouth daily. One chewable once daily   VYZULTA 0.024 % SOLN Apply 1 drop to eye at bedtime.     Family History  Problem Relation Age of Onset   Arthritis Mother    Myasthenia gravis Sister    Cancer Brother    Hypertension Sister    Diabetes Sister     Social History   Socioeconomic History   Marital status: Widowed    Spouse name: Not on file   Number of children: Not on file   Years of education: Not on file   Highest education level: Not on file  Occupational History   Not on file  Tobacco Use   Smoking status: Never    Passive exposure: Never   Smokeless tobacco: Never  Vaping Use   Vaping Use: Never used  Substance and Sexual Activity   Alcohol use: No   Drug use: Never   Sexual activity: Not on file  Other Topics Concern   Not on file  Social History Narrative   Not on file   Social Determinants of Health   Financial Resource Strain: Not on file  Food Insecurity: Not on file  Transportation Needs: Not on file  Physical Activity: Not on file  Stress: Not on file  Social Connections: Not on file   Review of Systems: Gen: Denies fever, chills, anorexia. Denies fatigue, weakness, weight loss.  CV: Denies chest pain, palpitations, syncope, peripheral edema, and claudication. Resp: Denies dyspnea at rest, cough, wheezing, coughing up blood, and pleurisy. GI: see HPI Derm: Denies rash, itching, dry skin Psych: Denies depression, anxiety, memory loss, confusion. No homicidal or suicidal ideation.  Heme: Denies bruising, bleeding, and enlarged lymph nodes.  Observations/Objective: No distress. Unable to perform physical exam due to telephone encounter. No video available.   Assessment and Plan: Dawn Estrada is a 89 y.o. female with past medical history of hiatal hernia, hypertension, anxiety and  arthritis who presents for GERD follow up.  GERD doing moderately well, PPI was decreased by patient's son down to 10mg  daily as patient c/o headaches they thought were associated. Having some acid/liquid regurgitation but patient feels it is better than previously. Appetite is good. No dysphagia, no weight loss, rectal bleeding or melena. We have discussed the possibility of endoscopic evaluation in the past but ultimately all parties to include myself felt Risks would outweigh the benefits at her age with given multi morbidities. Her son did inquire about a daily probiotic which we discussed could be beneficial for her if they would like to try one. Son also asked about safety of ongoing PPI therapy, patient and son were re-assured regarding PPI use and safety of when appropriate indications in question. Most recent studies on PPI therapy that association of symptoms is not equivalent to causation and overall association with for example osteoporosis is weak and based  on observational studies. When PPI use is indicated, it is safe to proceed with therapy and titrate dosing/use based on symptom response. Will continue with low dose daily PPI and good reflux precautions, they will make me aware of new or worsening symptoms.    Continue omeprazole 10mg  daily Follow up with PCP regarding HTN Reflux precautions Can try daily probiotic.    Follow Up Instructions: 6 months    I discussed the assessment and treatment plan with the patient. The patient was provided an opportunity to ask questions and all were answered. The patient agreed with the plan and demonstrated an understanding of the instructions.   The patient was advised to call back or seek an in-person evaluation if the symptoms worsen or if the condition fails to improve as anticipated.  I provided 10 minutes of NON face-to-face time during this telephone encounter   Kort Stettler L. Alver Sorrow, MSN, APRN, AGNP-C Adult-Gerontology Nurse  Practitioner [facility]  improvement

## 2022-04-25 NOTE — Patient Instructions (Signed)
Continue omeprazole 10mg  daily  Avoid greasy, spicy, fried, citrus foods, and be mindful that caffeine, carbonated drinks, chocolate and alcohol can increase reflux symptoms Stay upright 2-3 hours after eating, prior to lying down and avoid eating late in the evenings.  Can try daily probiotic, aim for one with 2-3 strains of bacteria  Please make me aware of new or worsening symptoms  Follow up 1 year

## 2022-04-26 DIAGNOSIS — Z742 Need for assistance at home and no other household member able to render care: Secondary | ICD-10-CM | POA: Diagnosis not present

## 2022-04-26 DIAGNOSIS — Z681 Body mass index (BMI) 19 or less, adult: Secondary | ICD-10-CM | POA: Diagnosis not present

## 2022-04-26 DIAGNOSIS — I10 Essential (primary) hypertension: Secondary | ICD-10-CM | POA: Diagnosis not present

## 2022-04-28 NOTE — Telephone Encounter (Signed)
Error

## 2022-05-06 DIAGNOSIS — I10 Essential (primary) hypertension: Secondary | ICD-10-CM | POA: Diagnosis not present

## 2022-05-08 DIAGNOSIS — R002 Palpitations: Secondary | ICD-10-CM | POA: Diagnosis not present

## 2022-05-08 DIAGNOSIS — I10 Essential (primary) hypertension: Secondary | ICD-10-CM | POA: Diagnosis not present

## 2022-05-10 ENCOUNTER — Other Ambulatory Visit: Payer: Self-pay | Admitting: Cardiology

## 2022-05-16 DIAGNOSIS — N390 Urinary tract infection, site not specified: Secondary | ICD-10-CM | POA: Diagnosis not present

## 2022-05-16 DIAGNOSIS — Z299 Encounter for prophylactic measures, unspecified: Secondary | ICD-10-CM | POA: Diagnosis not present

## 2022-05-16 DIAGNOSIS — R350 Frequency of micturition: Secondary | ICD-10-CM | POA: Diagnosis not present

## 2022-05-17 DIAGNOSIS — R350 Frequency of micturition: Secondary | ICD-10-CM | POA: Diagnosis not present

## 2022-05-31 DIAGNOSIS — R21 Rash and other nonspecific skin eruption: Secondary | ICD-10-CM | POA: Diagnosis not present

## 2022-05-31 DIAGNOSIS — N390 Urinary tract infection, site not specified: Secondary | ICD-10-CM | POA: Diagnosis not present

## 2022-06-06 DIAGNOSIS — I10 Essential (primary) hypertension: Secondary | ICD-10-CM | POA: Diagnosis not present

## 2022-06-07 DIAGNOSIS — K649 Unspecified hemorrhoids: Secondary | ICD-10-CM | POA: Diagnosis not present

## 2022-06-07 DIAGNOSIS — N390 Urinary tract infection, site not specified: Secondary | ICD-10-CM | POA: Diagnosis not present

## 2022-06-07 DIAGNOSIS — I10 Essential (primary) hypertension: Secondary | ICD-10-CM | POA: Diagnosis not present

## 2022-06-07 DIAGNOSIS — R109 Unspecified abdominal pain: Secondary | ICD-10-CM | POA: Diagnosis not present

## 2022-06-07 DIAGNOSIS — Z299 Encounter for prophylactic measures, unspecified: Secondary | ICD-10-CM | POA: Diagnosis not present

## 2022-06-08 ENCOUNTER — Other Ambulatory Visit: Payer: Self-pay | Admitting: Cardiology

## 2022-06-19 ENCOUNTER — Ambulatory Visit: Payer: Medicare Other | Admitting: Nurse Practitioner

## 2022-06-20 DIAGNOSIS — R5383 Other fatigue: Secondary | ICD-10-CM | POA: Diagnosis not present

## 2022-06-20 DIAGNOSIS — E042 Nontoxic multinodular goiter: Secondary | ICD-10-CM | POA: Diagnosis not present

## 2022-06-20 DIAGNOSIS — Z0000 Encounter for general adult medical examination without abnormal findings: Secondary | ICD-10-CM | POA: Diagnosis not present

## 2022-06-20 DIAGNOSIS — Z7189 Other specified counseling: Secondary | ICD-10-CM | POA: Diagnosis not present

## 2022-06-20 DIAGNOSIS — I10 Essential (primary) hypertension: Secondary | ICD-10-CM | POA: Diagnosis not present

## 2022-06-20 DIAGNOSIS — Z299 Encounter for prophylactic measures, unspecified: Secondary | ICD-10-CM | POA: Diagnosis not present

## 2022-06-20 DIAGNOSIS — I700 Atherosclerosis of aorta: Secondary | ICD-10-CM | POA: Diagnosis not present

## 2022-06-20 DIAGNOSIS — Z79899 Other long term (current) drug therapy: Secondary | ICD-10-CM | POA: Diagnosis not present

## 2022-06-20 DIAGNOSIS — E7800 Pure hypercholesterolemia, unspecified: Secondary | ICD-10-CM | POA: Diagnosis not present

## 2022-07-07 DIAGNOSIS — I10 Essential (primary) hypertension: Secondary | ICD-10-CM | POA: Diagnosis not present

## 2022-07-18 ENCOUNTER — Ambulatory Visit: Payer: Medicare Other | Attending: Nurse Practitioner | Admitting: Nurse Practitioner

## 2022-07-18 ENCOUNTER — Encounter: Payer: Self-pay | Admitting: Nurse Practitioner

## 2022-07-18 VITALS — BP 122/64 | HR 64 | Ht 63.0 in | Wt 132.2 lb

## 2022-07-18 DIAGNOSIS — R0789 Other chest pain: Secondary | ICD-10-CM | POA: Diagnosis not present

## 2022-07-18 DIAGNOSIS — R600 Localized edema: Secondary | ICD-10-CM | POA: Diagnosis not present

## 2022-07-18 DIAGNOSIS — I10 Essential (primary) hypertension: Secondary | ICD-10-CM | POA: Diagnosis not present

## 2022-07-18 DIAGNOSIS — R002 Palpitations: Secondary | ICD-10-CM

## 2022-07-18 MED ORDER — METOPROLOL TARTRATE 50 MG PO TABS: 75.0000 mg | ORAL_TABLET | Freq: Two times a day (BID) | ORAL | 6 refills | Status: DC

## 2022-07-18 MED ORDER — HYDROCHLOROTHIAZIDE 12.5 MG PO TABS: 12.5000 mg | ORAL_TABLET | Freq: Every day | ORAL | 6 refills | Status: DC

## 2022-07-18 NOTE — Progress Notes (Signed)
Office Visit    Patient Name: Dawn Estrada Date of Encounter: 07/18/2022  PCP:  Kirstie Peri, MD   [facility]  Cardiologist:  Dina Rich, MD  Advanced Practice Provider:  No care team member to display Electrophysiologist:  None   Chief Complaint    Dawn Estrada is a 89 y.o. female with a hx of palpitations, HTN, chest pain, leg edema, GERD, who presents today for 6 month follow-up.   Past Medical History    Past Medical History:  Diagnosis Date   Anxiety    Arthritis    Hiatal hernia    Hypertension    Reflux    Past Surgical History:  Procedure Laterality Date   ABDOMINAL HYSTERECTOMY     ABDOMINAL SURGERY     resection   APPENDECTOMY     HEMORROIDECTOMY     TONSILLECTOMY      Allergies  Allergies  Allergen Reactions   Diclofenac Sodium    Penicillins Swelling    Has patient had a PCN reaction causing immediate rash, facial/tongue/throat swelling, SOB or lightheadedness with hypotension: NO Has patient had a PCN reaction causing severe rash involving mucus membranes or skin necrosis: no Has patient had a PCN reaction that required hospitalization : no Has patient had a PCN reaction occurring within the last 10 years: no If all of the above answers are "NO", then may proceed with Cephalosporin use.    History of Present Illness    Dawn Estrada is a 89 y.o. female with a PMH as mentioned above.   Last seen by Dr. Dina Rich on November 02, 2021. Was overall doing well. She did note some ongoing palpitations, therefore Lopressor was increased to 50 mg BID.   Today she presents for 6 month follow-up. She states she is doing well. Does admit to indigestion like chest pain. Denies any anginal like symptoms. Denies any shortness of breath, syncope, presyncope, dizziness, orthopnea, PND, significant weight changes, acute bleeding, or claudication. Admits to often palpitations at night. Son notes occasional leg  edema.   EKGs/Labs/Other Studies Reviewed:   The following studies were reviewed today:   EKG:  EKG is ordered today.  The ekg ordered today demonstrates NSR, 63 bpm, LVH with repolarization abnormality, no acute ischemic changes.    Recent Labs: No results found for requested labs within last 365 days.  Recent Lipid Panel No results found for: "CHOL", "TRIG", "HDL", "CHOLHDL", "VLDL", "LDLCALC", "LDLDIRECT"  Home Medications   Current Meds  Medication Sig   ALPRAZolam (XANAX) 0.5 MG tablet Take 0.5 mg by mouth 2 (two) times daily as needed. Take 1/2 tablet by mouth twice a day as needed for anxiety   Cholecalciferol (VITAMIN D3) 400 units CAPS Take 250 mcg by mouth daily.   fexofenadine (ALLEGRA) 180 MG tablet Take 180 mg by mouth as needed for allergies or rhinitis.   Multiple Vitamin (MULTIVITAMIN) tablet Take 1 tablet by mouth daily.   omeprazole (PRILOSEC) 20 MG capsule Take 1 capsule (20 mg total) by mouth daily.   potassium chloride (KLOR-CON) 10 MEQ tablet Take 10 mEq by mouth daily.   timolol (TIMOPTIC) 0.5 % ophthalmic solution 1 drop 2 (two) times daily.   vitamin C (ASCORBIC ACID) 500 MG tablet Take 500 mg by mouth daily. One chewable once daily   VYZULTA 0.024 % SOLN Apply 1 drop to eye at bedtime.   hydrochlorothiazide (HYDRODIURIL) 25 MG tablet Take 25 mg by mouth daily.  metoprolol tartrate (LOPRESSOR) 50 MG tablet TAKE ONE TABLET BY MOUTH TWICE DAILY     Review of Systems    All other systems reviewed and are otherwise negative except as noted above.  Physical Exam    VS:  BP 122/64   Pulse 64   Ht 5\' 3"  (1.6 m)   Wt 132 lb 3.2 oz (60 kg)   SpO2 97%   BMI 23.42 kg/m  , BMI Body mass index is 23.42 kg/m.  Wt Readings from Last 3 Encounters:  07/18/22 132 lb 3.2 oz (60 kg)  04/25/22 134 lb (60.8 kg)  11/02/21 129 lb 9.6 oz (58.8 kg)     GEN: Thin, frail 89 year old female in no acute distress. HEENT: normal. Neck: Supple, no JVD, carotid bruits,  or masses. Cardiac: S1/S2, RRR, no murmurs, rubs, or gallops. No clubbing, cyanosis, edema.  Radials/PT 2+ and equal bilaterally.  Respiratory:  Respirations regular and unlabored, clear to auscultation bilaterally. MS: No deformity or atrophy. Skin: Warm and dry, no rash. Neuro:  Strength and sensation are intact, slight resting tremor to left forearm.  Psych: Normal affect.  Assessment & Plan    Palpitations, hx of sinus tachycardia Notes some ongoing palpitations at night despite taking Lopressor 50 mg BID, will increase to Lopressor 75 mg BID. Recent labs overall unremarkable. Heart healthy diet encouraged.   Hx of chest pain Atypical intermittent episodes, presentation is similar to GERD. No indication for ischemic evaluation at this time. Pt is not a surgical candidate. Increasing Lopressor as previously mentioned. Heart healthy diet encouraged. ED precautions discussed.   HTN BP stable today. No longer taking Amlodipine per son's report, will d/c. Will reduce HCTZ in half to go up on Lopressor as mentioned above. Discussed to monitor BP at home at least 2 hours after medications and sitting for 5-10 minutes. Heart healthy diet encouraged.   4. Leg edema No edema noted on exam. Recommended low salt, heart healthy diet encouraged and wearing compression stockings. She verbalized understanding.    Disposition: Follow up in 6 month(s) with Dina Rich, MD or APP.  Signed, Sharlene Dory, NP 07/18/2022, 4:22 PM [facility]

## 2022-07-18 NOTE — Patient Instructions (Addendum)
Medication Instructions:   Decrease HCTZ to 12.5mg  daily  Increase Lopressor to 75mg  (1 1/2 tabs) twice a day   Continue all other medications.     Labwork:  none  Testing/Procedures:  none  Follow-Up:  6 months   Any Other Special Instructions Will Be Listed Below (If Applicable).   If you need a refill on your cardiac medications before your next appointment, please call your pharmacy.

## 2022-08-16 DIAGNOSIS — N390 Urinary tract infection, site not specified: Secondary | ICD-10-CM | POA: Diagnosis not present

## 2022-08-16 DIAGNOSIS — Z299 Encounter for prophylactic measures, unspecified: Secondary | ICD-10-CM | POA: Diagnosis not present

## 2022-08-17 DIAGNOSIS — N390 Urinary tract infection, site not specified: Secondary | ICD-10-CM | POA: Diagnosis not present

## 2022-08-23 DIAGNOSIS — N390 Urinary tract infection, site not specified: Secondary | ICD-10-CM | POA: Diagnosis not present

## 2022-09-06 DIAGNOSIS — I10 Essential (primary) hypertension: Secondary | ICD-10-CM | POA: Diagnosis not present

## 2022-10-23 DIAGNOSIS — Z299 Encounter for prophylactic measures, unspecified: Secondary | ICD-10-CM | POA: Diagnosis not present

## 2022-10-23 DIAGNOSIS — I700 Atherosclerosis of aorta: Secondary | ICD-10-CM | POA: Diagnosis not present

## 2022-10-23 DIAGNOSIS — Z23 Encounter for immunization: Secondary | ICD-10-CM | POA: Diagnosis not present

## 2022-10-23 DIAGNOSIS — I10 Essential (primary) hypertension: Secondary | ICD-10-CM | POA: Diagnosis not present

## 2022-10-23 DIAGNOSIS — K219 Gastro-esophageal reflux disease without esophagitis: Secondary | ICD-10-CM | POA: Diagnosis not present

## 2022-10-26 DIAGNOSIS — B351 Tinea unguium: Secondary | ICD-10-CM | POA: Diagnosis not present

## 2022-10-26 DIAGNOSIS — I70203 Unspecified atherosclerosis of native arteries of extremities, bilateral legs: Secondary | ICD-10-CM | POA: Diagnosis not present

## 2022-10-26 DIAGNOSIS — M79676 Pain in unspecified toe(s): Secondary | ICD-10-CM | POA: Diagnosis not present

## 2022-10-26 DIAGNOSIS — L84 Corns and callosities: Secondary | ICD-10-CM | POA: Diagnosis not present

## 2022-11-13 DIAGNOSIS — H401112 Primary open-angle glaucoma, right eye, moderate stage: Secondary | ICD-10-CM | POA: Diagnosis not present

## 2022-12-01 ENCOUNTER — Other Ambulatory Visit (INDEPENDENT_AMBULATORY_CARE_PROVIDER_SITE_OTHER): Payer: Self-pay | Admitting: Gastroenterology

## 2022-12-01 NOTE — Telephone Encounter (Signed)
Patient is on omeprazole 10 mg daily

## 2022-12-06 DIAGNOSIS — I10 Essential (primary) hypertension: Secondary | ICD-10-CM | POA: Diagnosis not present

## 2022-12-11 ENCOUNTER — Other Ambulatory Visit: Payer: Self-pay | Admitting: Nurse Practitioner

## 2022-12-12 DIAGNOSIS — I10 Essential (primary) hypertension: Secondary | ICD-10-CM | POA: Diagnosis not present

## 2022-12-12 DIAGNOSIS — J069 Acute upper respiratory infection, unspecified: Secondary | ICD-10-CM | POA: Diagnosis not present

## 2022-12-12 DIAGNOSIS — R52 Pain, unspecified: Secondary | ICD-10-CM | POA: Diagnosis not present

## 2022-12-12 DIAGNOSIS — I471 Supraventricular tachycardia, unspecified: Secondary | ICD-10-CM | POA: Diagnosis not present

## 2022-12-29 DIAGNOSIS — H524 Presbyopia: Secondary | ICD-10-CM | POA: Diagnosis not present

## 2023-01-18 ENCOUNTER — Encounter: Payer: Self-pay | Admitting: Nurse Practitioner

## 2023-01-18 ENCOUNTER — Ambulatory Visit: Payer: Medicare Other | Attending: Nurse Practitioner | Admitting: Nurse Practitioner

## 2023-01-18 VITALS — BP 120/70 | HR 62 | Ht 66.0 in | Wt 136.0 lb

## 2023-01-18 DIAGNOSIS — I10 Essential (primary) hypertension: Secondary | ICD-10-CM

## 2023-01-18 DIAGNOSIS — R630 Anorexia: Secondary | ICD-10-CM | POA: Diagnosis not present

## 2023-01-18 DIAGNOSIS — R600 Localized edema: Secondary | ICD-10-CM | POA: Diagnosis not present

## 2023-01-18 NOTE — Patient Instructions (Signed)

## 2023-01-18 NOTE — Progress Notes (Signed)
   Office Visit    Patient Name: Dawn Estrada Date of Encounter: 01/18/2023 PCP:  Kirstie Peri, MD  [facility]  Cardiologist:  Dina Rich, MD  Advanced Practice Provider:  No care team member to display Electrophysiologist:  None   Chief Complaint and HPI    Dawn Estrada is a 89 y.o. female with a hx of palpitations, HTN, chest pain, leg edema, GERD, who presents today for 6 month follow-up.   Last seen by Dr. Dina Rich on November 02, 2021. Was overall doing well. She did note some ongoing palpitations, therefore Lopressor was increased to 50 mg BID.   Today she presents for 6 month follow-up. She states she is doing well.  Does admit to some poor appetite, does not feel hungry as much and is only eating about 2 times per day.  Denies any chest pain, shortness of breath, syncope, presyncope, dizziness, orthopnea, PND, significant weight changes, acute bleeding, or claudication.   EKGs/Labs/Other Studies Reviewed:   The following studies were reviewed today:   EKG:  EKG is not ordered today.    Echo 11/2014:   Study Conclusions   - Left ventricle: The cavity size was normal. Wall thickness was    increased in a pattern of mild LVH. Systolic function was    vigorous. The estimated ejection fraction was in the range of 65%    to 70%. Wall motion was normal; there were no regional wall    motion abnormalities. Doppler parameters are consistent with    abnormal left ventricular relaxation (grade 1 diastolic    dysfunction). Doppler parameters are consistent with high    ventricular filling pressure.    Review of Systems    All other systems reviewed and are otherwise negative except as noted above.  Physical Exam    VS:  BP 120/70   Pulse 62   Ht 5\' 6"  (1.676 m)   Wt 136 lb (61.7 kg)   SpO2 98%   BMI 21.95 kg/m  , BMI Body mass index is 21.95 kg/m.  Wt Readings from Last 3 Encounters:  01/18/23 136 lb (61.7 kg)   07/18/22 132 lb 3.2 oz (60 kg)  04/25/22 134 lb (60.8 kg)     GEN: Thin, frail 89 year old female in no acute distress. HEENT: normal. Neck: Supple, no JVD, carotid bruits, or masses. Cardiac: S1/S2, RRR, no murmurs, rubs, or gallops. No clubbing, cyanosis, edema.  Radials/PT 2+ and equal bilaterally.  Respiratory:  Respirations regular and unlabored, clear to auscultation bilaterally. MS: No deformity or atrophy. Skin: Warm and dry, no rash. Neuro:  Strength and sensation are intact Psych: Normal affect.  Assessment & Plan    HTN BP stable today. Discussed to monitor BP at home at least 2 hours after medications and sitting for 5-10 minutes.  No medication changes at this time.  Heart healthy diet encouraged.   2. Leg edema No edema noted on exam.  She is wearing her compression stockings on exam.  Recommended low salt, heart healthy diet encouraged and wearing compression stockings PRN. She verbalized understanding.  No medication changes at this time.  3.  Poor appetite Discussed Premier protein supplements and for further discussion with her PCP. Continue to follow with PCP. Pt and son verbalized understanding.    Disposition: Follow up in 6 month(s) with Dina Rich, MD or APP.  Signed, Sharlene Dory, NP

## 2023-03-08 DIAGNOSIS — M79676 Pain in unspecified toe(s): Secondary | ICD-10-CM | POA: Diagnosis not present

## 2023-03-08 DIAGNOSIS — L84 Corns and callosities: Secondary | ICD-10-CM | POA: Diagnosis not present

## 2023-03-08 DIAGNOSIS — I70203 Unspecified atherosclerosis of native arteries of extremities, bilateral legs: Secondary | ICD-10-CM | POA: Diagnosis not present

## 2023-03-08 DIAGNOSIS — B351 Tinea unguium: Secondary | ICD-10-CM | POA: Diagnosis not present

## 2023-03-16 ENCOUNTER — Emergency Department (HOSPITAL_COMMUNITY): Payer: Medicare Other

## 2023-03-16 ENCOUNTER — Encounter (HOSPITAL_COMMUNITY): Payer: Self-pay | Admitting: *Deleted

## 2023-03-16 ENCOUNTER — Inpatient Hospital Stay (HOSPITAL_COMMUNITY)
Admission: EM | Admit: 2023-03-16 | Discharge: 2023-03-29 | DRG: 329 | Disposition: A | Discharge status: Home Health | Payer: Medicare Other | Attending: Internal Medicine | Admitting: Internal Medicine

## 2023-03-16 ENCOUNTER — Other Ambulatory Visit: Payer: Self-pay

## 2023-03-16 DIAGNOSIS — N281 Cyst of kidney, acquired: Secondary | ICD-10-CM | POA: Diagnosis not present

## 2023-03-16 DIAGNOSIS — Z79899 Other long term (current) drug therapy: Secondary | ICD-10-CM

## 2023-03-16 DIAGNOSIS — E8809 Other disorders of plasma-protein metabolism, not elsewhere classified: Secondary | ICD-10-CM | POA: Diagnosis not present

## 2023-03-16 DIAGNOSIS — T50995A Adverse effect of other drugs, medicaments and biological substances, initial encounter: Secondary | ICD-10-CM | POA: Diagnosis not present

## 2023-03-16 DIAGNOSIS — K7689 Other specified diseases of liver: Secondary | ICD-10-CM | POA: Diagnosis not present

## 2023-03-16 DIAGNOSIS — R059 Cough, unspecified: Secondary | ICD-10-CM | POA: Diagnosis not present

## 2023-03-16 DIAGNOSIS — Z880 Allergy status to penicillin: Secondary | ICD-10-CM | POA: Diagnosis not present

## 2023-03-16 DIAGNOSIS — Z888 Allergy status to other drugs, medicaments and biological substances status: Secondary | ICD-10-CM | POA: Diagnosis not present

## 2023-03-16 DIAGNOSIS — K219 Gastro-esophageal reflux disease without esophagitis: Secondary | ICD-10-CM | POA: Diagnosis not present

## 2023-03-16 DIAGNOSIS — Z4682 Encounter for fitting and adjustment of non-vascular catheter: Secondary | ICD-10-CM | POA: Diagnosis not present

## 2023-03-16 DIAGNOSIS — E46 Unspecified protein-calorie malnutrition: Secondary | ICD-10-CM | POA: Diagnosis not present

## 2023-03-16 DIAGNOSIS — K449 Diaphragmatic hernia without obstruction or gangrene: Secondary | ICD-10-CM | POA: Diagnosis not present

## 2023-03-16 DIAGNOSIS — K56609 Unspecified intestinal obstruction, unspecified as to partial versus complete obstruction: Secondary | ICD-10-CM | POA: Diagnosis not present

## 2023-03-16 DIAGNOSIS — R918 Other nonspecific abnormal finding of lung field: Secondary | ICD-10-CM | POA: Diagnosis not present

## 2023-03-16 DIAGNOSIS — I10 Essential (primary) hypertension: Secondary | ICD-10-CM | POA: Diagnosis present

## 2023-03-16 DIAGNOSIS — D122 Benign neoplasm of ascending colon: Secondary | ICD-10-CM | POA: Diagnosis not present

## 2023-03-16 DIAGNOSIS — Z8261 Family history of arthritis: Secondary | ICD-10-CM

## 2023-03-16 DIAGNOSIS — E876 Hypokalemia: Secondary | ICD-10-CM | POA: Diagnosis not present

## 2023-03-16 DIAGNOSIS — R109 Unspecified abdominal pain: Secondary | ICD-10-CM | POA: Diagnosis not present

## 2023-03-16 DIAGNOSIS — K565 Intestinal adhesions [bands], unspecified as to partial versus complete obstruction: Secondary | ICD-10-CM | POA: Diagnosis not present

## 2023-03-16 DIAGNOSIS — Z452 Encounter for adjustment and management of vascular access device: Secondary | ICD-10-CM | POA: Diagnosis not present

## 2023-03-16 DIAGNOSIS — E860 Dehydration: Secondary | ICD-10-CM | POA: Diagnosis present

## 2023-03-16 DIAGNOSIS — Z01810 Encounter for preprocedural cardiovascular examination: Secondary | ICD-10-CM | POA: Diagnosis not present

## 2023-03-16 DIAGNOSIS — R54 Age-related physical debility: Secondary | ICD-10-CM | POA: Diagnosis not present

## 2023-03-16 DIAGNOSIS — Z90710 Acquired absence of both cervix and uterus: Secondary | ICD-10-CM | POA: Diagnosis not present

## 2023-03-16 DIAGNOSIS — E871 Hypo-osmolality and hyponatremia: Secondary | ICD-10-CM | POA: Diagnosis not present

## 2023-03-16 DIAGNOSIS — Z8249 Family history of ischemic heart disease and other diseases of the circulatory system: Secondary | ICD-10-CM | POA: Diagnosis not present

## 2023-03-16 DIAGNOSIS — K649 Unspecified hemorrhoids: Secondary | ICD-10-CM | POA: Diagnosis present

## 2023-03-16 DIAGNOSIS — J90 Pleural effusion, not elsewhere classified: Secondary | ICD-10-CM | POA: Diagnosis not present

## 2023-03-16 DIAGNOSIS — U071 COVID-19: Secondary | ICD-10-CM | POA: Diagnosis present

## 2023-03-16 DIAGNOSIS — K56699 Other intestinal obstruction unspecified as to partial versus complete obstruction: Secondary | ICD-10-CM | POA: Diagnosis not present

## 2023-03-16 DIAGNOSIS — R739 Hyperglycemia, unspecified: Secondary | ICD-10-CM | POA: Diagnosis not present

## 2023-03-16 DIAGNOSIS — F419 Anxiety disorder, unspecified: Secondary | ICD-10-CM | POA: Diagnosis present

## 2023-03-16 DIAGNOSIS — K6389 Other specified diseases of intestine: Secondary | ICD-10-CM | POA: Diagnosis present

## 2023-03-16 DIAGNOSIS — Z833 Family history of diabetes mellitus: Secondary | ICD-10-CM

## 2023-03-16 DIAGNOSIS — J9811 Atelectasis: Secondary | ICD-10-CM | POA: Diagnosis not present

## 2023-03-16 DIAGNOSIS — R509 Fever, unspecified: Secondary | ICD-10-CM | POA: Diagnosis not present

## 2023-03-16 LAB — COMPREHENSIVE METABOLIC PANEL
ALT: 16 U/L (ref 0–44)
AST: 29 U/L (ref 15–41)
Albumin: 3.3 g/dL — ABNORMAL LOW (ref 3.5–5.0)
Alkaline Phosphatase: 53 U/L (ref 38–126)
Anion gap: 10 (ref 5–15)
BUN: 12 mg/dL (ref 8–23)
CO2: 23 mmol/L (ref 22–32)
Calcium: 8.4 mg/dL — ABNORMAL LOW (ref 8.9–10.3)
Chloride: 96 mmol/L — ABNORMAL LOW (ref 98–111)
Creatinine, Ser: 0.94 mg/dL (ref 0.44–1.00)
GFR, Estimated: 53 mL/min — ABNORMAL LOW (ref >60)
Glucose, Bld: 149 mg/dL — ABNORMAL HIGH (ref 70–99)
Potassium: 3.4 mmol/L — ABNORMAL LOW (ref 3.5–5.1)
Sodium: 129 mmol/L — ABNORMAL LOW (ref 135–145)
Total Bilirubin: 1.5 mg/dL — ABNORMAL HIGH (ref 0.0–1.2)
Total Protein: 7 g/dL (ref 6.5–8.1)

## 2023-03-16 LAB — CBC
HCT: 38.2 % (ref 36.0–46.0)
Hemoglobin: 12.6 g/dL (ref 12.0–15.0)
MCH: 29.9 pg (ref 26.0–34.0)
MCHC: 33 g/dL (ref 30.0–36.0)
MCV: 90.7 fL (ref 80.0–100.0)
Platelets: 202 10*3/uL (ref 150–400)
RBC: 4.21 MIL/uL (ref 3.87–5.11)
RDW: 13.1 % (ref 11.5–15.5)
WBC: 7.5 10*3/uL (ref 4.0–10.5)
nRBC: 0 % (ref 0.0–0.2)

## 2023-03-16 LAB — URINALYSIS, ROUTINE W REFLEX MICROSCOPIC
Bacteria, UA: NONE SEEN
Bilirubin Urine: NEGATIVE
Glucose, UA: NEGATIVE mg/dL
Hgb urine dipstick: NEGATIVE
Ketones, ur: NEGATIVE mg/dL
Nitrite: NEGATIVE
Protein, ur: 30 mg/dL — AB
Specific Gravity, Urine: 1.017 (ref 1.005–1.030)
pH: 6 (ref 5.0–8.0)

## 2023-03-16 LAB — SODIUM, URINE, RANDOM: Sodium, Ur: 32 mmol/L

## 2023-03-16 LAB — LIPASE, BLOOD: Lipase: 29 U/L (ref 11–51)

## 2023-03-16 MED ORDER — PANTOPRAZOLE SODIUM 40 MG IV SOLR
40.0000 mg | INTRAVENOUS | Status: DC
  Administered 2023-03-16 – 2023-03-27 (×12): 40 mg via INTRAVENOUS
  Filled 2023-03-16 (×11): qty 10

## 2023-03-16 MED ORDER — FENTANYL CITRATE PF 50 MCG/ML IJ SOSY
50.0000 ug | PREFILLED_SYRINGE | Freq: Once | INTRAMUSCULAR | Status: AC
  Administered 2023-03-16: 50 ug via INTRAVENOUS
  Filled 2023-03-16: qty 1

## 2023-03-16 MED ORDER — POTASSIUM CHLORIDE 10 MEQ/100ML IV SOLN
10.0000 meq | INTRAVENOUS | Status: AC
  Administered 2023-03-16 – 2023-03-17 (×2): 10 meq via INTRAVENOUS
  Filled 2023-03-16 (×2): qty 100

## 2023-03-16 MED ORDER — IOHEXOL 300 MG/ML  SOLN
100.0000 mL | Freq: Once | INTRAMUSCULAR | Status: AC | PRN
  Administered 2023-03-16: 100 mL via INTRAVENOUS

## 2023-03-16 MED ORDER — ONDANSETRON HCL 4 MG/2ML IJ SOLN
4.0000 mg | Freq: Once | INTRAMUSCULAR | Status: AC
  Administered 2023-03-16: 4 mg via INTRAVENOUS
  Filled 2023-03-16: qty 2

## 2023-03-16 MED ORDER — MORPHINE SULFATE (PF) 2 MG/ML IV SOLN
2.0000 mg | INTRAVENOUS | Status: DC | PRN
  Administered 2023-03-18: 2 mg via INTRAVENOUS
  Filled 2023-03-16: qty 1

## 2023-03-16 MED ORDER — HYDROCORTISONE 1 % EX CREA
TOPICAL_CREAM | Freq: Two times a day (BID) | CUTANEOUS | Status: DC
Start: 2023-03-16 — End: 2023-03-29
  Administered 2023-03-25 – 2023-03-27 (×2): 1 via TOPICAL
  Filled 2023-03-16 (×5): qty 28

## 2023-03-16 MED ORDER — SODIUM CHLORIDE 0.9 % IV BOLUS
500.0000 mL | Freq: Once | INTRAVENOUS | Status: AC
  Administered 2023-03-16: 500 mL via INTRAVENOUS

## 2023-03-16 NOTE — H&P (Signed)
 History and Physical    Patient: Dawn Estrada FMW:[id number] DOB: 1919-12-10 DOA: 03/16/2023 DOS: the patient was seen and examined on 03/17/2023 PCP: Maree Isles, MD  Patient coming from: Home  Chief Complaint:  Chief Complaint  Patient presents with   Emesis   HPI: Dawn Estrada is a 89 y.o. female with medical history significant of essential hypertension, hiatal hernia, hemorrhoids who presents to the emergency department due to several months onset of intermittent abdominal pain which worsened last night.  Pain was mid abdominal and was rated as 10/10 on pain scale, this was associated with nausea, vomiting and some diarrhea last night.  There was no vomiting or diarrhea today.  Family was worried about dehydration and she was taken to the ED for further evaluation and management.  ED Course:  In the emergency department, BP was 171/66, other vital signs were within normal range.  Workup in the ED showed normal CBC.  BMP showed sodium 129, potassium 3.4, chloride 96, bicarb 23, blood glucose 149, BUN 12, creatinine 0.94, calcium  8.4, albumin  3.3, lipase, Urinalysis was normal CT abdomen and pelvis with contrast showed small bowel obstruction with transition point in the distal ileum where there is an enhancing lesion measuring 1.9 x 0.9 cm. Findings are worrisome for neoplasm. Patient was treated with IV fentanyl , Zofran  and IV hydration was provided. General Surgery (Dr. Kallie) was consulted and will see patient in the morning. Hospitalist was asked to admit patient for further evaluation and management.  Review of Systems: Review of systems as noted in the HPI. All other systems reviewed and are negative.   Past Medical History:  Diagnosis Date   Anxiety    Arthritis    Hiatal hernia    Hypertension    Reflux    Past Surgical History:  Procedure Laterality Date   ABDOMINAL HYSTERECTOMY     ABDOMINAL SURGERY     resection   APPENDECTOMY     HEMORROIDECTOMY      TONSILLECTOMY      Social History:  reports that she has never smoked. She has never been exposed to tobacco smoke. She has never used smokeless tobacco. She reports that she does not drink alcohol and does not use drugs.   Allergies  Allergen Reactions   Diclofenac Sodium    Penicillins Swelling    Has patient had a PCN reaction causing immediate rash, facial/tongue/throat swelling, SOB or lightheadedness with hypotension: NO Has patient had a PCN reaction causing severe rash involving mucus membranes or skin necrosis: no Has patient had a PCN reaction that required hospitalization : no Has patient had a PCN reaction occurring within the last 10 years: no If all of the above answers are NO, then may proceed with Cephalosporin use.    Family History  Problem Relation Age of Onset   Arthritis Mother    Myasthenia gravis Sister    Cancer Brother    Hypertension Sister    Diabetes Sister      Prior to Admission medications   Medication Sig Start Date End Date Taking? Authorizing Provider  ALPRAZolam  (XANAX ) 0.5 MG tablet Take 0.5 mg by mouth 2 (two) times daily as needed. Take 1/2 tablet by mouth twice a day as needed for anxiety 11/13/20   [provider]  Cholecalciferol  (VITAMIN D3) 400 units CAPS Take 250 mcg by mouth daily. 06/22/17   [provider]  fexofenadine (ALLEGRA) 180 MG tablet Take 180 mg by mouth as needed for allergies or rhinitis.  [provider]  hydrochlorothiazide  (HYDRODIURIL ) 12.5 MG tablet TAKE 1 TABLET(12.5 MG) BY MOUTH DAILY 12/11/22   Miriam Norris, NP  metoprolol  tartrate (LOPRESSOR ) 50 MG tablet TAKE 1 AND 1/2 TABLETS(75 MG) BY MOUTH TWICE DAILY 12/11/22   Miriam Norris, NP  Multiple Vitamin (MULTIVITAMIN) tablet Take 1 tablet by mouth daily.    [provider]  omeprazole  (PRILOSEC) 20 MG capsule Take 1 capsule (20 mg total) by mouth daily. 10/25/21   Carlan, Chelsea L, NP  potassium chloride  (KLOR-CON ) 10 MEQ  tablet Take 10 mEq by mouth daily. 11/11/20   [provider]  timolol  (TIMOPTIC ) 0.5 % ophthalmic solution 1 drop 2 (two) times daily. 12/09/21   [provider]  vitamin C (ASCORBIC ACID) 500 MG tablet Take 500 mg by mouth daily. One chewable once daily    [provider]  VYZULTA  0.024 % SOLN Apply 1 drop to eye at bedtime. 04/12/20   [provider]    Physical Exam: BP (!) 152/64   Pulse 72   Temp 99.3 F (37.4 C) (Oral)   Resp 14   Ht 5' 6 (1.676 m)   Wt 61.7 kg   SpO2 98%   BMI 21.95 kg/m   General: 89 y.o. year-old female well developed well nourished in no acute distress.  Alert and oriented x3. HEENT: NCAT, EOMI Neck: Supple, trachea medial Cardiovascular: Regular rate and rhythm with no rubs or gallops.  No thyromegaly or JVD noted.  No lower extremity edema. 2/4 pulses in all 4 extremities. Respiratory: Clear to auscultation with no wheezes or rales. Good inspiratory effort. Abdomen: Soft, tender to palpation of right mid abdomen without guarding.  Nondistended with normal bowel sounds x4 quadrants. Muskuloskeletal: No cyanosis, clubbing or edema noted bilaterally Neuro: CN II-XII intact, strength 5/5 x 4, sensation, reflexes intact Skin: No ulcerative lesions noted or rashes Psychiatry: Judgement and insight appear normal. Mood is appropriate for condition and setting          Labs on Admission:  Basic Metabolic Panel: Recent Labs  Lab 03/16/23 1448  NA 129*  K 3.4*  CL 96*  CO2 23  GLUCOSE 149*  BUN 12  CREATININE 0.94  CALCIUM  8.4*   Liver Function Tests: Recent Labs  Lab 03/16/23 1448  AST 29  ALT 16  ALKPHOS 53  BILITOT 1.5*  PROT 7.0  ALBUMIN  3.3*   Recent Labs  Lab 03/16/23 1448  LIPASE 29   No results for input(s): AMMONIA in the last 168 hours. CBC: Recent Labs  Lab 03/16/23 1448  WBC 7.5  HGB 12.6  HCT 38.2  MCV 90.7  PLT 202   Cardiac Enzymes: No results for input(s): CKTOTAL, CKMB,  CKMBINDEX, TROPONINI in the last 168 hours.  BNP (last 3 results) No results for input(s): BNP in the last 8760 hours.  ProBNP (last 3 results) No results for input(s): PROBNP in the last 8760 hours.  CBG: No results for input(s): GLUCAP in the last 168 hours.  Radiological Exams on Admission: CT ABDOMEN PELVIS W CONTRAST Result Date: 03/16/2023 CLINICAL DATA:  Acute abdominal pain EXAM: CT ABDOMEN AND PELVIS WITH CONTRAST TECHNIQUE: Multidetector CT imaging of the abdomen and pelvis was performed using the standard protocol following bolus administration of intravenous contrast. RADIATION DOSE REDUCTION: This exam was performed according to the departmental dose-optimization program which includes automated exposure control, adjustment of the mA and/or kV according to patient size and/or use of iterative reconstruction technique. CONTRAST:  100mL OMNIPAQUE  IOHEXOL   300 MG/ML  SOLN COMPARISON:  CT abdomen and pelvis 01/09/2021 FINDINGS: Lower chest: There is a trace left pleural effusion. Hepatobiliary: There are scattered hepatic cysts measuring up to 2.5 cm similar to the prior study. Gallbladder is not seen. There is no biliary ductal dilatation. Pancreas: Unremarkable. No pancreatic ductal dilatation or surrounding inflammatory changes. Spleen: Normal in size without focal abnormality. Adrenals/Urinary Tract: Bilateral renal cysts are present. The largest is in the left kidney measuring 3.7 cm. There is no hydronephrosis or perinephric fat stranding. The adrenal glands and bladder are within normal limits. Stomach/Bowel: There some mildly dilated and borderline dilated small bowel loops with air-fluid levels involving the mid small bowel. Transition point is seen in the distal ileum on image 2/59 where there is an enhancing lesion in the small bowel measuring 1.9 x 0.9 cm image 2/59. Terminal ileum is decompressed. Mild mesenteric edema present. Air-fluid levels are seen throughout small  bowel. There is a moderate-sized hiatal hernia. The stomach is decompressed. The colon is nondilated. The appendix is not well seen. There is no pneumatosis or free air. Vascular/Lymphatic: Aortic atherosclerosis. No enlarged abdominal or pelvic lymph nodes. Reproductive: Status post hysterectomy. No adnexal masses. Other: No ascites.  There is presacral edema.  There is no free air. Musculoskeletal: No fracture is seen. IMPRESSION: 1. Small-bowel obstruction with transition point in the distal ileum where there is an enhancing lesion measuring 1.9 x 0.9 cm. Findings are worrisome for neoplasm. 2. Mild mesenteric edema. No pneumatosis or free air. 3. Trace left pleural effusion. 4. Hepatic and renal cysts. No follow-up imaging recommended. 5. Moderate-sized hiatal hernia. 6. Aortic atherosclerosis. Aortic Atherosclerosis (ICD10-I70.0). Electronically Signed   By: Greig Pique M.D.   On: 03/16/2023 18:37    EKG: I independently viewed the EKG done and my findings are as followed: EKG was not done in the ED  Assessment/Plan Present on Admission:  Small bowel obstruction (HCC)  Hypokalemia  Essential hypertension  Gastroesophageal reflux disease  Principal Problem:   Small bowel obstruction (HCC) Active Problems:   Hypokalemia   Essential hypertension   Gastroesophageal reflux disease   Hyponatremia   Hypoalbuminemia   Hiatal hernia   Hemorrhoids  Small bowel obstruction CT abdomen and pelvis with contrast showed small bowel obstruction with transition point in the distal ileum where there is an enhancing lesion measuring 1.9 x 0.9 cm. Findings are worrisome for neoplasm. No indication for NG tube at this time Continue NPO at this time with plan to advance diet as tolerated Continue IV morphine  2 mg q.4h p.r.n. for moderate/severe pain Continue Zofran  p.r.n. for nausea/vomiting General Surgery (Dr. Kallie) was already consulted and will follow-up with patient in the  morning  Hyponatremia Na 129, no known cause at this time, though may be due to medication effect considering that patient is on HCTZ Urine osmolality, serum osmolality and urine sodium will be checked  Hypokalemia K+ 3.4, this will be replenished  Hypoalbuminemia Albumin  3.3, consider protein supplement when patient resumes oral intake  Essential hypertension Continue IV hydralazine  10 mg every 6 hours as needed for SBP > 170 Consider oral meds when patient resumes oral intake  Hiatal hernia GERD Continue Protonix   Hemorrhoids Continue Preparation H   DVT prophylaxis: SCDs  Code Status: Full code  Family Communication: Son at bedside (all questions and satisfaction)  Consults: General Surgery  Severity of Illness: The appropriate patient status for this patient is INPATIENT. Inpatient status is judged to be reasonable and necessary in  order to provide the required intensity of service to ensure the patient's safety. The patient's presenting symptoms, physical exam findings, and initial radiographic and laboratory data in the context of their chronic comorbidities is felt to place them at high risk for further clinical deterioration. Furthermore, it is not anticipated that the patient will be medically stable for discharge from the hospital within 2 midnights of admission.   * I certify that at the point of admission it is my clinical judgment that the patient will require inpatient hospital care spanning beyond 2 midnights from the point of admission due to high intensity of service, high risk for further deterioration and high frequency of surveillance required.*  Author: Ellary Casamento, DO 03/17/2023 12:36 AM  For on call review [email or web address].

## 2023-03-16 NOTE — ED Provider Triage Note (Signed)
 Emergency Medicine Provider Triage Evaluation Note  Dawn Estrada , a 89 y.o. female  was evaluated in triage.  Pt complains of chest pain, abdominal pain, generalized weakness, nausea vomiting and diarrhea.  GI symptoms began yesterday.  She endorses multiple episodes of vomiting throughout the night.  Vomiting and diarrhea seem to have resolved today.  She was able to eat some oatmeal this morning and drink fluids.  She complains of intermittent pains of her mid to upper abdomen and intermittent pains of right hip and back.  Denies any fever, dysuria, black or bloody stools.  Family states she has episodes of abdominal pain secondary to history of hiatal hernia.  Family is concerned about possible dehydration.  Review of Systems  Positive: Nausea, vomiting, diarrhea, abdominal pain, right hip pain, generalized weakness Negative: Fever, chills, black or bloody stools  Physical Exam  BP (!) 123/56 (BP Location: Right Arm)   Pulse 70   Temp 99.3 F (37.4 C) (Oral)   Resp 14   Ht 5' 6 (1.676 m)   Wt 61.7 kg   SpO2 98%   BMI 21.95 kg/m  Gen:   Awake, no distress   Resp:  Normal effort  MSK:   Moves extremities without difficulty    Medical Decision Making  Medically screening exam initiated at 3:23 PM.  Appropriate orders placed.  Dawn Estrada was informed that the remainder of the evaluation will be completed by another provider, this initial triage assessment does not replace that evaluation, and the importance of remaining in the ED until their evaluation is complete.     Dawn Milling, PA-C 03/16/23 1526

## 2023-03-16 NOTE — ED Provider Notes (Signed)
 [place] [facility] DE[facility]MENT AT [facility] Provider Note   CSN: [id number] Arrival date & time: 03/16/23  1418     History  Chief Complaint  Patient presents with   Emesis    Dawn Estrada is a 89 y.o. female.   Emesis Patient with nausea vomiting.  Some diarrhea.  Had abdominal pain for months.  Has seen PCP.  History of hiatal hernia.  Family worried about dehydration.  Mid abdominal pain.  No severe weight loss.    Past Medical History:  Diagnosis Date   Anxiety    Arthritis    Hiatal hernia    Hypertension    Reflux     Home Medications Prior to Admission medications   Medication Sig Start Date End Date Taking? Authorizing Provider  ALPRAZolam  (XANAX ) 0.5 MG tablet Take 0.5 mg by mouth 2 (two) times daily as needed. Take 1/2 tablet by mouth twice a day as needed for anxiety 11/13/20   [provider]  Cholecalciferol  (VITAMIN D3) 400 units CAPS Take 250 mcg by mouth daily. 06/22/17   [provider]  fexofenadine (ALLEGRA) 180 MG tablet Take 180 mg by mouth as needed for allergies or rhinitis.    [provider]  hydrochlorothiazide  (HYDRODIURIL ) 12.5 MG tablet TAKE 1 TABLET(12.5 MG) BY MOUTH DAILY 12/11/22   Miriam Norris, NP  metoprolol  tartrate (LOPRESSOR ) 50 MG tablet TAKE 1 AND 1/2 TABLETS(75 MG) BY MOUTH TWICE DAILY 12/11/22   Miriam Norris, NP  Multiple Vitamin (MULTIVITAMIN) tablet Take 1 tablet by mouth daily.    [provider]  omeprazole  (PRILOSEC) 20 MG capsule Take 1 capsule (20 mg total) by mouth daily. 10/25/21   Carlan, Chelsea L, NP  potassium chloride  (KLOR-CON ) 10 MEQ tablet Take 10 mEq by mouth daily. 11/11/20   [provider]  timolol  (TIMOPTIC ) 0.5 % ophthalmic solution 1 drop 2 (two) times daily. 12/09/21   [provider]  vitamin C (ASCORBIC ACID) 500 MG tablet Take 500 mg by mouth daily. One chewable once daily    [provider]  VYZULTA  0.024 % SOLN Apply 1 drop  to eye at bedtime. 04/12/20   [provider]      Allergies    Diclofenac sodium and Penicillins    Review of Systems   Review of Systems  Gastrointestinal:  Positive for vomiting.    Physical Exam Updated Vital Signs BP (!) 171/66   Pulse 65   Temp 99.3 F (37.4 C) (Oral)   Resp 16   Ht 5' 6 (1.676 m)   Wt 61.7 kg   SpO2 95%   BMI 21.95 kg/m  Physical Exam Vitals and nursing note reviewed.  HENT:     Head: Normocephalic.  Eyes:     Pupils: Pupils are equal, round, and reactive to light.  Cardiovascular:     Rate and Rhythm: Normal rate.  Abdominal:     Tenderness: There is abdominal tenderness.     Comments: Diffuse abdominal tenderness but particularly in the right mid abdomen.  No rebound or guarding.  No hernia palpated.  Musculoskeletal:        General: No tenderness.  Skin:    General: Skin is warm.  Neurological:     Mental Status: She is alert and oriented to person, place, and time.     ED Results / Procedures / Treatments   Labs (all labs ordered are listed, but only abnormal results are displayed) Labs Reviewed  COMPREHENSIVE METABOLIC PANEL -  Abnormal; Notable for the following components:      Result Value   Sodium 129 (*)    Potassium 3.4 (*)    Chloride 96 (*)    Glucose, Bld 149 (*)    Calcium  8.4 (*)    Albumin  3.3 (*)    Total Bilirubin 1.5 (*)    GFR, Estimated 53 (*)    All other components within normal limits  LIPASE, BLOOD  CBC  URINALYSIS, ROUTINE W REFLEX MICROSCOPIC    EKG None  Radiology CT ABDOMEN PELVIS W CONTRAST Result Date: 03/16/2023 CLINICAL DATA:  Acute abdominal pain EXAM: CT ABDOMEN AND PELVIS WITH CONTRAST TECHNIQUE: Multidetector CT imaging of the abdomen and pelvis was performed using the standard protocol following bolus administration of intravenous contrast. RADIATION DOSE REDUCTION: This exam was performed according to the departmental dose-optimization program which includes automated exposure  control, adjustment of the mA and/or kV according to patient size and/or use of iterative reconstruction technique. CONTRAST:  OMNIPAQUE  IOHEXOL  300 MG/ML  SOLN COMPARISON:  CT abdomen and pelvis 01/09/2021 FINDINGS: Lower chest: There is a trace left pleural effusion. Hepatobiliary: There are scattered hepatic cysts measuring up to 2.5 cm similar to the prior study. Gallbladder is not seen. There is no biliary ductal dilatation. Pancreas: Unremarkable. No pancreatic ductal dilatation or surrounding inflammatory changes. Spleen: Normal in size without focal abnormality. Adrenals/Urinary Tract: Bilateral renal cysts are present. The largest is in the left kidney measuring 3.7 cm. There is no hydronephrosis or perinephric fat stranding. The adrenal glands and bladder are within normal limits. Stomach/Bowel: There some mildly dilated and borderline dilated small bowel loops with air-fluid levels involving the mid small bowel. Transition point is seen in the distal ileum on image 2/59 where there is an enhancing lesion in the small bowel measuring 1.9 x 0.9 cm image 2/59. Terminal ileum is decompressed. Mild mesenteric edema present. Air-fluid levels are seen throughout small bowel. There is a moderate-sized hiatal hernia. The stomach is decompressed. The colon is nondilated. The appendix is not well seen. There is no pneumatosis or free air. Vascular/Lymphatic: Aortic atherosclerosis. No enlarged abdominal or pelvic lymph nodes. Reproductive: Status post hysterectomy. No adnexal masses. Other: No ascites.  There is presacral edema.  There is no free air. Musculoskeletal: No fracture is seen. IMPRESSION: 1. Small-bowel obstruction with transition point in the distal ileum where there is an enhancing lesion measuring 1.9 x 0.9 cm. Findings are worrisome for neoplasm. 2. Mild mesenteric edema. No pneumatosis or free air. 3. Trace left pleural effusion. 4. Hepatic and renal cysts. No follow-up imaging recommended. 5.  Moderate-sized hiatal hernia. 6. Aortic atherosclerosis. Aortic Atherosclerosis (ICD10-I70.0). Electronically Signed   By: Greig Pique M.D.   On: 03/16/2023 18:37    Procedures Procedures    Medications Ordered in ED Medications  iohexol  (OMNIPAQUE ) 300 MG/ML solution 100 mL (100 mLs Intravenous Contrast Given 03/16/23 1738)  sodium chloride  0.9 % bolus 500 mL (500 mLs Intravenous New Bag/Given 03/16/23 1932)  ondansetron  (ZOFRAN ) injection 4 mg (4 mg Intravenous Given 03/16/23 1927)  fentaNYL  (SUBLIMAZE ) injection 50 mcg (50 mcg Intravenous Given 03/16/23 1930)    ED Course/ Medical Decision Making/ A&P                                 Medical Decision Making Amount and/or Complexity of Data Reviewed Labs: ordered.  Risk Prescription drug management.   Patient was abdominal pain.  Is had for months.  Has nausea vomiting and some diarrhea.  Differential diagnoses long but includes causes such as gastroenteritis.  Unfortunately CT scan was done and does show a small bowel obstruction from likely malignancy.  Does not have vomiting today but was vomiting last night.  States not really feeling that nauseous right now.  Will give pain medicines.  Will to stop with general surgery.  Does have a hyponatremia.  Will require admission to hospital.  Discussed with Dr. Kallie from general surgery.  Will see in consult.  Ideally would be able to hold off on surgery.  Does not necessarily need NG tube at this time.  Will need fluid optimization.  Will discuss with hospitalist for admission.           Final Clinical Impression(s) / ED Diagnoses Final diagnoses:  Small bowel obstruction (HCC)  Dehydration    Rx / DC Orders ED Discharge Orders     None         Patsey Lot, MD 03/16/23 2013

## 2023-03-16 NOTE — ED Triage Notes (Signed)
 Pt with abd pain and back/hip pain,  pt with known hernia.  Reported by family pt with emesis and diarrhea since last night.ate some oatmeal and PO fluids and was able to keep down.

## 2023-03-17 DIAGNOSIS — K56609 Unspecified intestinal obstruction, unspecified as to partial versus complete obstruction: Secondary | ICD-10-CM

## 2023-03-17 DIAGNOSIS — K649 Unspecified hemorrhoids: Secondary | ICD-10-CM | POA: Diagnosis present

## 2023-03-17 DIAGNOSIS — E871 Hypo-osmolality and hyponatremia: Secondary | ICD-10-CM | POA: Diagnosis present

## 2023-03-17 DIAGNOSIS — K449 Diaphragmatic hernia without obstruction or gangrene: Secondary | ICD-10-CM

## 2023-03-17 DIAGNOSIS — E8809 Other disorders of plasma-protein metabolism, not elsewhere classified: Secondary | ICD-10-CM | POA: Diagnosis present

## 2023-03-17 LAB — COMPREHENSIVE METABOLIC PANEL
ALT: 15 U/L (ref 0–44)
AST: 21 U/L (ref 15–41)
Albumin: 3 g/dL — ABNORMAL LOW (ref 3.5–5.0)
Alkaline Phosphatase: 47 U/L (ref 38–126)
Anion gap: 10 (ref 5–15)
BUN: 12 mg/dL (ref 8–23)
CO2: 22 mmol/L (ref 22–32)
Calcium: 8.3 mg/dL — ABNORMAL LOW (ref 8.9–10.3)
Chloride: 99 mmol/L (ref 98–111)
Creatinine, Ser: 0.77 mg/dL (ref 0.44–1.00)
GFR, Estimated: >60 mL/min (ref >60)
Glucose, Bld: 100 mg/dL — ABNORMAL HIGH (ref 70–99)
Potassium: 3.4 mmol/L — ABNORMAL LOW (ref 3.5–5.1)
Sodium: 131 mmol/L — ABNORMAL LOW (ref 135–145)
Total Bilirubin: 1.6 mg/dL — ABNORMAL HIGH (ref 0.0–1.2)
Total Protein: 6.5 g/dL (ref 6.5–8.1)

## 2023-03-17 LAB — CBC
HCT: 36.7 % (ref 36.0–46.0)
Hemoglobin: 12 g/dL (ref 12.0–15.0)
MCH: 29.4 pg (ref 26.0–34.0)
MCHC: 32.7 g/dL (ref 30.0–36.0)
MCV: 90 fL (ref 80.0–100.0)
Platelets: 195 10*3/uL (ref 150–400)
RBC: 4.08 MIL/uL (ref 3.87–5.11)
RDW: 13 % (ref 11.5–15.5)
WBC: 7.6 10*3/uL (ref 4.0–10.5)
nRBC: 0 % (ref 0.0–0.2)

## 2023-03-17 LAB — MAGNESIUM: Magnesium: 1.5 mg/dL — ABNORMAL LOW (ref 1.7–2.4)

## 2023-03-17 LAB — PHOSPHORUS: Phosphorus: 2.6 mg/dL (ref 2.5–4.6)

## 2023-03-17 LAB — OSMOLALITY: Osmolality: 280 mosm/kg (ref 275–295)

## 2023-03-17 LAB — OSMOLALITY, URINE: Osmolality, Ur: 558 mosm/kg (ref 300–900)

## 2023-03-17 MED ORDER — MAGNESIUM SULFATE 2 GM/50ML IV SOLN
2.0000 g | Freq: Once | INTRAVENOUS | Status: AC
  Administered 2023-03-17: 2 g via INTRAVENOUS
  Filled 2023-03-17: qty 50

## 2023-03-17 MED ORDER — ACETAMINOPHEN 325 MG PO TABS
650.0000 mg | ORAL_TABLET | Freq: Four times a day (QID) | ORAL | Status: DC | PRN
  Administered 2023-03-19 – 2023-03-27 (×3): 650 mg via ORAL
  Filled 2023-03-17 (×3): qty 2

## 2023-03-17 MED ORDER — ALPRAZOLAM 0.5 MG PO TABS
0.5000 mg | ORAL_TABLET | Freq: Two times a day (BID) | ORAL | Status: DC
  Administered 2023-03-17: 1 mg via ORAL
  Administered 2023-03-18: 0.5 mg via ORAL
  Administered 2023-03-18: 1 mg via ORAL
  Administered 2023-03-19 – 2023-03-22 (×4): 0.5 mg via ORAL
  Filled 2023-03-17: qty 1
  Filled 2023-03-17 (×2): qty 2
  Filled 2023-03-17 (×2): qty 1
  Filled 2023-03-17 (×2): qty 2
  Filled 2023-03-17 (×2): qty 1

## 2023-03-17 MED ORDER — ACETAMINOPHEN 650 MG RE SUPP
650.0000 mg | Freq: Four times a day (QID) | RECTAL | Status: DC | PRN
  Administered 2023-03-19: 650 mg via RECTAL
  Filled 2023-03-17: qty 1

## 2023-03-17 MED ORDER — ONDANSETRON HCL 4 MG PO TABS: 4.0000 mg | ORAL_TABLET | Freq: Four times a day (QID) | ORAL | Status: DC | PRN

## 2023-03-17 MED ORDER — HYDRALAZINE HCL 20 MG/ML IJ SOLN: 10.0000 mg | INTRAMUSCULAR | Status: DC | PRN

## 2023-03-17 MED ORDER — KCL IN DEXTROSE-NACL 40-5-0.9 MEQ/L-%-% IV SOLN
INTRAVENOUS | Status: DC
  Filled 2023-03-17 (×2): qty 1000

## 2023-03-17 MED ORDER — ONDANSETRON HCL 4 MG/2ML IJ SOLN
4.0000 mg | Freq: Four times a day (QID) | INTRAMUSCULAR | Status: AC | PRN
Start: 2023-03-17 — End: ?
  Administered 2023-03-21 – 2023-03-29 (×11): 4 mg via INTRAVENOUS
  Filled 2023-03-17 (×12): qty 2

## 2023-03-17 MED ORDER — METOPROLOL TARTRATE 50 MG PO TABS
50.0000 mg | ORAL_TABLET | Freq: Two times a day (BID) | ORAL | Status: DC
  Administered 2023-03-17 – 2023-03-22 (×7): 50 mg via ORAL
  Filled 2023-03-17 (×9): qty 1

## 2023-03-17 NOTE — Plan of Care (Signed)

## 2023-03-17 NOTE — ED Notes (Signed)
Pt ambulated to bedside commode with assitance

## 2023-03-17 NOTE — Progress Notes (Signed)
   03/17/23 1035  TOC Brief Assessment  Insurance and Status Reviewed  Patient has primary care physician Yes  Home environment has been reviewed From home, adult children  Prior level of function: Needs assistance, adult children. Pt 89 years old  Prior/Current Home Services No current home services  Transition of care needs transition of care needs identified, TOC will continue to follow (Potential TOC needs)

## 2023-03-17 NOTE — Consult Note (Signed)
 [facility] Consult  Reason for Consult: Small bowel mass, Small bowel obstruction  Referring Physician: Dr. Maree   Chief Complaint   Emesis     HPI: Dawn Estrada is a 89 y.o. female with minimal medical history of HTN and hemorrhoids who comes in with abdominal pain for a period of time and some nausea/vomiting and diarrhea at times. She was brought into the ED by her family who was worried about dehydration. She lives with her youngest son and reports that she gets around with a walker. She has been getting weaker in the last few weeks. She has had multiple abdominal surgeries in the past. She  reports no personal history of cancer. She says her youngest son Ozell that she lives with has had cancer but she is not sure what type.   She has three sons. She denies any dark, bloody or tarry stools.   Past Medical History:  Diagnosis Date   Anxiety    Arthritis    Hiatal hernia    Hypertension    Reflux     Past Surgical History:  Procedure Laterality Date   ABDOMINAL HYSTERECTOMY     ABDOMINAL SURGERY     resection   APPENDECTOMY     HEMORROIDECTOMY     TONSILLECTOMY      Family History  Problem Relation Age of Onset   Arthritis Mother    Myasthenia gravis Sister    Cancer Brother    Hypertension Sister    Diabetes Sister     Social History   Tobacco Use   Smoking status: Never    Passive exposure: Never   Smokeless tobacco: Never  Vaping Use   Vaping status: Never Used  Substance Use Topics   Alcohol use: No   Drug use: Never    Medications: I have reviewed the patient's current medications. Prior to Admission:  Medications Prior to Admission  Medication Sig Dispense Refill Last Dose/Taking   ALPRAZolam  (XANAX ) 1 MG tablet Take 1 mg by mouth 2 (two) times daily.      Cholecalciferol  (VITAMIN D3) 400 units CAPS Take 250 mcg by mouth daily.  7    ciclopirox (PENLAC) 8 % solution Apply topically daily.      fexofenadine (ALLEGRA)  180 MG tablet Take 180 mg by mouth as needed for allergies or rhinitis.      hydrochlorothiazide  (HYDRODIURIL ) 12.5 MG tablet TAKE 1 TABLET(12.5 MG) BY MOUTH DAILY 30 tablet 2    metoprolol  tartrate (LOPRESSOR ) 50 MG tablet TAKE 1 AND 1/2 TABLETS(75 MG) BY MOUTH TWICE DAILY 90 tablet 3    Multiple Vitamin (MULTIVITAMIN) tablet Take 1 tablet by mouth daily.      omeprazole  (PRILOSEC) 20 MG capsule Take 1 capsule (20 mg total) by mouth daily. 90 capsule 3    potassium chloride  (KLOR-CON ) 10 MEQ tablet Take 20 mEq by mouth daily.      timolol  (TIMOPTIC ) 0.5 % ophthalmic solution 1 drop 2 (two) times daily.      vitamin C (ASCORBIC ACID) 500 MG tablet Take 500 mg by mouth daily. One chewable once daily      VYZULTA  0.024 % SOLN Apply 1 drop to eye at bedtime.      Scheduled:  hydrocortisone  cream   Topical BID   pantoprazole  (PROTONIX ) IV  40 mg Intravenous Q24H   Continuous:  dextrose  5 % and 0.9 % NaCl with KCl 40 mEq/L 75 mL/hr at 03/17/23 0912   PRN:acetaminophen  **OR** acetaminophen , hydrALAZINE , morphine   injection, ondansetron  **OR** ondansetron  (ZOFRAN ) IV  Allergies  Allergen Reactions   Diclofenac Sodium    Penicillins Swelling    Has patient had a PCN reaction causing immediate rash, facial/tongue/throat swelling, SOB or lightheadedness with hypotension: NO Has patient had a PCN reaction causing severe rash involving mucus membranes or skin necrosis: no Has patient had a PCN reaction that required hospitalization : no Has patient had a PCN reaction occurring within the last 10 years: no If all of the above answers are NO, then may proceed with Cephalosporin use.     ROS:  A comprehensive review of systems was negative except for: Gastrointestinal: positive for abdominal pain, nausea, and vomiting  Blood pressure (!) 147/61, pulse 72, temperature 97.9 F (36.6 C), temperature source Oral, resp. rate 14, height 5' 6 (1.676 m), weight 61.7 kg, SpO2 97%. Physical Exam Vitals  reviewed.  HENT:     Head: Normocephalic.     Nose: Nose normal.  Eyes:     Extraocular Movements: Extraocular movements intact.  Cardiovascular:     Rate and Rhythm: Normal rate.  Pulmonary:     Effort: Pulmonary effort is normal.  Abdominal:     General: There is distension.     Palpations: Abdomen is soft.     Tenderness: There is no abdominal tenderness.     Comments: Multiple scars, hemorrhoid but no obvious mass, no DRE done   Musculoskeletal:        General: No swelling.     Cervical back: Normal range of motion.  Skin:    General: Skin is warm.  Neurological:     General: No focal deficit present.     Mental Status: She is alert and oriented to person, place, and time.  Psychiatric:        Mood and Affect: Mood normal.        Behavior: Behavior normal.     Results: Results for orders placed or performed during the hospital encounter of 03/16/23 (from the past 48 hours)  Urinalysis, Routine w reflex microscopic -Urine, Clean Catch     Status: Abnormal   Collection Time: 03/16/23  2:31 PM  Result Value Ref Range   Color, Urine YELLOW YELLOW   APPearance CLEAR CLEAR   Specific Gravity, Urine 1.017 1.005 - 1.030   pH 6.0 5.0 - 8.0   Glucose, UA NEGATIVE NEGATIVE mg/dL   Hgb urine dipstick NEGATIVE NEGATIVE   Bilirubin Urine NEGATIVE NEGATIVE   Ketones, ur NEGATIVE NEGATIVE mg/dL   Protein, ur 30 (A) NEGATIVE mg/dL   Nitrite NEGATIVE NEGATIVE   Leukocytes,Ua TRACE (A) NEGATIVE   RBC / HPF 0-5 0 - 5 RBC/hpf   WBC, UA 0-5 0 - 5 WBC/hpf   Bacteria, UA NONE SEEN NONE SEEN   Squamous Epithelial / HPF 0-5 0 - 5 /HPF   Mucus PRESENT    Hyaline Casts, UA PRESENT     Comment: Performed at [facility], [address], [place], [place] [address]  Lipase, blood     Status: None   Collection Time: 03/16/23  2:48 PM  Result Value Ref Range   Lipase 29 11 - 51 U/L    Comment: Performed at [facility], [address] [place], [place] [address]  Comprehensive metabolic  panel     Status: Abnormal   Collection Time: 03/16/23  2:48 PM  Result Value Ref Range   Sodium 129 (L) 135 - 145 mmol/L   Potassium 3.4 (L) 3.5 - 5.1 mmol/L  Chloride 96 (L) 98 - 111 mmol/L   CO2 23 22 - 32 mmol/L   Glucose, Bld 149 (H) 70 - 99 mg/dL    Comment: Glucose reference range applies only to samples taken after fasting for at least 8 hours.   BUN 12 8 - 23 mg/dL   Creatinine, Ser 9.05 0.44 - 1.00 mg/dL   Calcium  8.4 (L) 8.9 - 10.3 mg/dL   Total Protein 7.0 6.5 - 8.1 g/dL   Albumin  3.3 (L) 3.5 - 5.0 g/dL   AST 29 15 - 41 U/L   ALT 16 0 - 44 U/L   Alkaline Phosphatase 53 38 - 126 U/L   Total Bilirubin 1.5 (H) 0.0 - 1.2 mg/dL   GFR, Estimated 53 (L) >60 mL/min    Comment: (NOTE) Calculated using the CKD-EPI Creatinine Equation (2021)    Anion gap 10 5 - 15    Comment: Performed at [facility], [address], [place], [place] [address]  CBC     Status: None   Collection Time: 03/16/23  2:48 PM  Result Value Ref Range   WBC 7.5 4.0 - 10.5 K/uL   RBC 4.21 3.87 - 5.11 MIL/uL   Hemoglobin 12.6 12.0 - 15.0 g/dL   HCT 61.7 63.9 - 53.9 %   MCV 90.7 80.0 - 100.0 fL   MCH 29.9 26.0 - 34.0 pg   MCHC 33.0 30.0 - 36.0 g/dL   RDW 86.8 88.4 - 84.4 %   Platelets 202 150 - 400 K/uL   nRBC 0.0 0.0 - 0.2 %    Comment: Performed at [facility], [address] [place], [place] [address]  Sodium, urine, random     Status: None   Collection Time: 03/16/23 10:49 PM  Result Value Ref Range   Sodium, Ur 32 mmol/L    Comment: Performed at [facility], [address], [place], [place] [address]  Comprehensive metabolic panel     Status: Abnormal   Collection Time: 03/17/23  3:21 AM  Result Value Ref Range   Sodium 131 (L) 135 - 145 mmol/L   Potassium 3.4 (L) 3.5 - 5.1 mmol/L   Chloride 99 98 - 111 mmol/L   CO2 22 22 - 32 mmol/L   Glucose, Bld 100 (H) 70 - 99 mg/dL    Comment: Glucose reference range applies only to samples taken after fasting for at least 8 hours.   BUN 12 8 - 23  mg/dL   Creatinine, Ser 9.22 0.44 - 1.00 mg/dL   Calcium  8.3 (L) 8.9 - 10.3 mg/dL   Total Protein 6.5 6.5 - 8.1 g/dL   Albumin  3.0 (L) 3.5 - 5.0 g/dL   AST 21 15 - 41 U/L   ALT 15 0 - 44 U/L   Alkaline Phosphatase 47 38 - 126 U/L   Total Bilirubin 1.6 (H) 0.0 - 1.2 mg/dL   GFR, Estimated >39 >39 mL/min    Comment: (NOTE) Calculated using the CKD-EPI Creatinine Equation (2021)    Anion gap 10 5 - 15    Comment: Performed at [facility], [address], [place], [place] [address]  CBC     Status: None   Collection Time: 03/17/23  3:21 AM  Result Value Ref Range   WBC 7.6 4.0 - 10.5 K/uL   RBC 4.08 3.87 - 5.11 MIL/uL   Hemoglobin 12.0 12.0 - 15.0 g/dL   HCT 63.2 63.9 - 53.9 %   MCV 90.0 80.0 - 100.0 fL   MCH 29.4  26.0 - 34.0 pg   MCHC 32.7 30.0 - 36.0 g/dL   RDW 86.9 88.4 - 84.4 %   Platelets 195 150 - 400 K/uL   nRBC 0.0 0.0 - 0.2 %    Comment: Performed at [facility], [address], [place], [place] [address]  Magnesium      Status: Abnormal   Collection Time: 03/17/23  3:21 AM  Result Value Ref Range   Magnesium  1.5 (L) 1.7 - 2.4 mg/dL    Comment: Performed at [facility], [address] [place], [place] [address]  Phosphorus     Status: None   Collection Time: 03/17/23  3:21 AM  Result Value Ref Range   Phosphorus 2.6 2.5 - 4.6 mg/dL    Comment: Performed at [facility], [address] [place], [place] [address]   Personally reviewed -small bowel with mass and bowel obstruction  CT ABDOMEN PELVIS W CONTRAST Result Date: 03/16/2023 CLINICAL DATA:  Acute abdominal pain EXAM: CT ABDOMEN AND PELVIS WITH CONTRAST TECHNIQUE: Multidetector CT imaging of the abdomen and pelvis was performed using the standard protocol following bolus administration of intravenous contrast. RADIATION DOSE REDUCTION: This exam was performed according to the departmental dose-optimization program which includes automated exposure control, adjustment of the mA and/or kV according to patient size  and/or use of iterative reconstruction technique. CONTRAST:  OMNIPAQUE  IOHEXOL  300 MG/ML  SOLN COMPARISON:  CT abdomen and pelvis 01/09/2021 FINDINGS: Lower chest: There is a trace left pleural effusion. Hepatobiliary: There are scattered hepatic cysts measuring up to 2.5 cm similar to the prior study. Gallbladder is not seen. There is no biliary ductal dilatation. Pancreas: Unremarkable. No pancreatic ductal dilatation or surrounding inflammatory changes. Spleen: Normal in size without focal abnormality. Adrenals/Urinary Tract: Bilateral renal cysts are present. The largest is in the left kidney measuring 3.7 cm. There is no hydronephrosis or perinephric fat stranding. The adrenal glands and bladder are within normal limits. Stomach/Bowel: There some mildly dilated and borderline dilated small bowel loops with air-fluid levels involving the mid small bowel. Transition point is seen in the distal ileum on image 2/59 where there is an enhancing lesion in the small bowel measuring 1.9 x 0.9 cm image 2/59. Terminal ileum is decompressed. Mild mesenteric edema present. Air-fluid levels are seen throughout small bowel. There is a moderate-sized hiatal hernia. The stomach is decompressed. The colon is nondilated. The appendix is not well seen. There is no pneumatosis or free air. Vascular/Lymphatic: Aortic atherosclerosis. No enlarged abdominal or pelvic lymph nodes. Reproductive: Status post hysterectomy. No adnexal masses. Other: No ascites.  There is presacral edema.  There is no free air. Musculoskeletal: No fracture is seen. IMPRESSION: 1. Small-bowel obstruction with transition point in the distal ileum where there is an enhancing lesion measuring 1.9 x 0.9 cm. Findings are worrisome for neoplasm. 2. Mild mesenteric edema. No pneumatosis or free air. 3. Trace left pleural effusion. 4. Hepatic and renal cysts. No follow-up imaging recommended. 5. Moderate-sized hiatal hernia. 6. Aortic atherosclerosis. Aortic  Atherosclerosis (ICD10-I70.0). Electronically Signed   By: Greig Pique M.D.   On: 03/16/2023 18:37     Assessment & Plan:  AMAURIE SCHRECKENGOST is a 89 y.o. female with a SBO and mass in the small bowel. She is relatively healthy for 89 with only HTN and hemorrhoids. She is nervous about any surgery. Discussed the obstruction, the mass the concern for cancer. Discussed surgery to remove the small bowel mass and potential need for colon removal if the mass  is too close to the colon. Discussed that we cannot do a bowel prep due to her obstruction. Discussed enemas today. Discussed that she would have a risk of bleeding, infection, anastomotic leak, and issues after anesthesia. Discussed need for rehab after. Discussed that they will talk about surgery with the family and we will talk more.   EKG today Enema today Ice chip ok Ambulation, she may need PT to help with preventing further weakness   All questions were answered to the satisfaction of the patient and family.    Manuelita JAYSON Pander 03/17/2023, 12:04 PM

## 2023-03-17 NOTE — Progress Notes (Signed)
 PROGRESS NOTE    Dawn Estrada  FMW:[id number] DOB: 06-16-1919 DOA: 03/16/2023 PCP: Maree Isles, MD   Brief Narrative:    Dawn Estrada is a 89 y.o. female with medical history significant of essential hypertension, hiatal hernia, hemorrhoids who presents to the emergency department due to several months onset of intermittent abdominal pain which worsened last night.  Patient was admitted for small bowel obstruction.  General surgery consulted with evaluation pending.  Multiple electrolyte abnormalities noted.  Assessment & Plan:   Principal Problem:   Small bowel obstruction (HCC) Active Problems:   Hypokalemia   Essential hypertension   Gastroesophageal reflux disease   Hyponatremia   Hypoalbuminemia   Hiatal hernia   Hemorrhoids  Assessment and Plan:   Small bowel obstruction CT abdomen and pelvis with contrast showed small bowel obstruction with transition point in the distal ileum where there is an enhancing lesion measuring 1.9 x 0.9 cm. Findings are worrisome for neoplasm. No indication for NG tube at this time Continue NPO at this time with plan to advance diet as tolerated Continue IV morphine  2 mg q.4h p.r.n. for moderate/severe pain Continue Zofran  p.r.n. for nausea/vomiting Appreciate general surgery evaluation Continue on IV fluid   Hyponatremia Na 129, no known cause at this time, though may be due to medication effect considering that patient is on HCTZ Urine osmolality, serum osmolality and urine sodium will be checked   Hypokalemia/hypomagnesemia Replete and reevaluate in a.m.   Hypoalbuminemia Albumin  3.3, consider protein supplement when patient resumes oral intake   Essential hypertension Continue IV hydralazine  10 mg every 6 hours as needed for SBP > 170 Consider oral meds when patient resumes oral intake   Hiatal hernia GERD Continue Protonix    Hemorrhoids Continue Preparation H   DVT prophylaxis: SCDs Code Status: Full Family  Communication: Son at bedside 2/8 Disposition Plan:  Status is: Inpatient Remains inpatient appropriate because: Need for IV medication.  Consultants:  General surgery  Procedures:  None  Antimicrobials:  None   Subjective: Patient seen and evaluated today with no new acute complaints or concerns.  She has had some loose bowel movements and denies any nausea or vomiting this morning.  Objective: Vitals:   03/17/23 0300 03/17/23 0330 03/17/23 0400 03/17/23 0500  BP: (!) 145/72 (!) 150/68 (!) 147/69 (!) 146/71  Pulse: 74 70 78 73  Resp:  14    Temp:      TempSrc:      SpO2: 97% 97% 96% 98%  Weight:      Height:        Intake/Output Summary (Last 24 hours) at 03/17/2023 0718 Last data filed at 03/17/2023 0117 Gross per 24 hour  Intake 675.41 ml  Output --  Net 675.41 ml   Filed Weights   03/16/23 1427  Weight: 61.7 kg    Examination:  General exam: Appears calm and comfortable  Respiratory system: Clear to auscultation. Respiratory effort normal. Cardiovascular system: S1 & S2 heard, RRR.  Gastrointestinal system: Abdomen is soft Central nervous system: Alert and awake Extremities: No edema Skin: No significant lesions noted Psychiatry: Flat affect.    Data Reviewed: I have personally reviewed following labs and imaging studies  CBC: Recent Labs  Lab 03/16/23 1448 03/17/23 0321  WBC 7.5 7.6  HGB 12.6 12.0  HCT 38.2 36.7  MCV 90.7 90.0  PLT 202 195   Basic Metabolic Panel: Recent Labs  Lab 03/16/23 1448 03/17/23 0321  NA 129* 131*  K 3.4* 3.4*  CL 96* 99  CO2 23 22  GLUCOSE 149* 100*  BUN 12 12  CREATININE 0.94 0.77  CALCIUM  8.4* 8.3*  MG  --  1.5*  PHOS  --  2.6   GFR: Estimated Creatinine Clearance: 33.3 mL/min (by C-G formula based on SCr of 0.77 mg/dL). Liver Function Tests: Recent Labs  Lab 03/16/23 1448 03/17/23 0321  AST 29 21  ALT 16 15  ALKPHOS 53 47  BILITOT 1.5* 1.6*  PROT 7.0 6.5  ALBUMIN  3.3* 3.0*   Recent Labs   Lab 03/16/23 1448  LIPASE 29   No results for input(s): AMMONIA in the last 168 hours. Coagulation Profile: No results for input(s): INR, PROTIME in the last 168 hours. Cardiac Enzymes: No results for input(s): CKTOTAL, CKMB, CKMBINDEX, TROPONINI in the last 168 hours. BNP (last 3 results) No results for input(s): PROBNP in the last 8760 hours. HbA1C: No results for input(s): HGBA1C in the last 72 hours. CBG: No results for input(s): GLUCAP in the last 168 hours. Lipid Profile: No results for input(s): CHOL, HDL, LDLCALC, TRIG, CHOLHDL, LDLDIRECT in the last 72 hours. Thyroid  Function Tests: No results for input(s): TSH, T4TOTAL, FREET4, T3FREE, THYROIDAB in the last 72 hours. Anemia Panel: No results for input(s): VITAMINB12, FOLATE, FERRITIN, TIBC, IRON, RETICCTPCT in the last 72 hours. Sepsis Labs: No results for input(s): PROCALCITON, LATICACIDVEN in the last 168 hours.  No results found for this or any previous visit (from the past 240 hours).       Radiology Studies: CT ABDOMEN PELVIS W CONTRAST Result Date: 03/16/2023 CLINICAL DATA:  Acute abdominal pain EXAM: CT ABDOMEN AND PELVIS WITH CONTRAST TECHNIQUE: Multidetector CT imaging of the abdomen and pelvis was performed using the standard protocol following bolus administration of intravenous contrast. RADIATION DOSE REDUCTION: This exam was performed according to the departmental dose-optimization program which includes automated exposure control, adjustment of the mA and/or kV according to patient size and/or use of iterative reconstruction technique. CONTRAST:  OMNIPAQUE  IOHEXOL  300 MG/ML  SOLN COMPARISON:  CT abdomen and pelvis 01/09/2021 FINDINGS: Lower chest: There is a trace left pleural effusion. Hepatobiliary: There are scattered hepatic cysts measuring up to 2.5 cm similar to the prior study. Gallbladder is not seen. There is no biliary ductal  dilatation. Pancreas: Unremarkable. No pancreatic ductal dilatation or surrounding inflammatory changes. Spleen: Normal in size without focal abnormality. Adrenals/Urinary Tract: Bilateral renal cysts are present. The largest is in the left kidney measuring 3.7 cm. There is no hydronephrosis or perinephric fat stranding. The adrenal glands and bladder are within normal limits. Stomach/Bowel: There some mildly dilated and borderline dilated small bowel loops with air-fluid levels involving the mid small bowel. Transition point is seen in the distal ileum on image 2/59 where there is an enhancing lesion in the small bowel measuring 1.9 x 0.9 cm image 2/59. Terminal ileum is decompressed. Mild mesenteric edema present. Air-fluid levels are seen throughout small bowel. There is a moderate-sized hiatal hernia. The stomach is decompressed. The colon is nondilated. The appendix is not well seen. There is no pneumatosis or free air. Vascular/Lymphatic: Aortic atherosclerosis. No enlarged abdominal or pelvic lymph nodes. Reproductive: Status post hysterectomy. No adnexal masses. Other: No ascites.  There is presacral edema.  There is no free air. Musculoskeletal: No fracture is seen. IMPRESSION: 1. Small-bowel obstruction with transition point in the distal ileum where there is an enhancing lesion measuring 1.9 x 0.9 cm. Findings are worrisome for neoplasm. 2. Mild mesenteric edema. No pneumatosis  or free air. 3. Trace left pleural effusion. 4. Hepatic and renal cysts. No follow-up imaging recommended. 5. Moderate-sized hiatal hernia. 6. Aortic atherosclerosis. Aortic Atherosclerosis (ICD10-I70.0). Electronically Signed   By: Greig Pique M.D.   On: 03/16/2023 18:37        Scheduled Meds:  hydrocortisone  cream   Topical BID   pantoprazole  (PROTONIX ) IV  40 mg Intravenous Q24H   Continuous Infusions:  dextrose  5 % and 0.9 % NaCl with KCl 40 mEq/L     magnesium  sulfate bolus IVPB       LOS: 1 day    Time  spent: 55 minutes    Monique Hefty JONETTA Fairly, DO [facility]  If 7PM-7AM, please contact night-coverage [email or web address] 03/17/2023, 7:18 AM

## 2023-03-17 NOTE — H&P (View-Only) (Signed)
 [facility] Consult  Reason for Consult: Small bowel mass, Small bowel obstruction  Referring Physician: Dr. Maree   Chief Complaint   Emesis     HPI: Dawn Estrada is a 89 y.o. female with minimal medical history of HTN and hemorrhoids who comes in with abdominal pain for a period of time and some nausea/vomiting and diarrhea at times. She was brought into the ED by her family who was worried about dehydration. She lives with her youngest son and reports that she gets around with a walker. She has been getting weaker in the last few weeks. She has had multiple abdominal surgeries in the past. She  reports no personal history of cancer. She says her youngest son Ozell that she lives with has had cancer but she is not sure what type.   She has three sons. She denies any dark, bloody or tarry stools.   Past Medical History:  Diagnosis Date   Anxiety    Arthritis    Hiatal hernia    Hypertension    Reflux     Past Surgical History:  Procedure Laterality Date   ABDOMINAL HYSTERECTOMY     ABDOMINAL SURGERY     resection   APPENDECTOMY     HEMORROIDECTOMY     TONSILLECTOMY      Family History  Problem Relation Age of Onset   Arthritis Mother    Myasthenia gravis Sister    Cancer Brother    Hypertension Sister    Diabetes Sister     Social History   Tobacco Use   Smoking status: Never    Passive exposure: Never   Smokeless tobacco: Never  Vaping Use   Vaping status: Never Used  Substance Use Topics   Alcohol use: No   Drug use: Never    Medications: I have reviewed the patient's current medications. Prior to Admission:  Medications Prior to Admission  Medication Sig Dispense Refill Last Dose/Taking   ALPRAZolam  (XANAX ) 1 MG tablet Take 1 mg by mouth 2 (two) times daily.      Cholecalciferol  (VITAMIN D3) 400 units CAPS Take 250 mcg by mouth daily.  7    ciclopirox (PENLAC) 8 % solution Apply topically daily.      fexofenadine (ALLEGRA)  180 MG tablet Take 180 mg by mouth as needed for allergies or rhinitis.      hydrochlorothiazide  (HYDRODIURIL ) 12.5 MG tablet TAKE 1 TABLET(12.5 MG) BY MOUTH DAILY 30 tablet 2    metoprolol  tartrate (LOPRESSOR ) 50 MG tablet TAKE 1 AND 1/2 TABLETS(75 MG) BY MOUTH TWICE DAILY 90 tablet 3    Multiple Vitamin (MULTIVITAMIN) tablet Take 1 tablet by mouth daily.      omeprazole  (PRILOSEC) 20 MG capsule Take 1 capsule (20 mg total) by mouth daily. 90 capsule 3    potassium chloride  (KLOR-CON ) 10 MEQ tablet Take 20 mEq by mouth daily.      timolol  (TIMOPTIC ) 0.5 % ophthalmic solution 1 drop 2 (two) times daily.      vitamin C (ASCORBIC ACID) 500 MG tablet Take 500 mg by mouth daily. One chewable once daily      VYZULTA  0.024 % SOLN Apply 1 drop to eye at bedtime.      Scheduled:  hydrocortisone  cream   Topical BID   pantoprazole  (PROTONIX ) IV  40 mg Intravenous Q24H   Continuous:  dextrose  5 % and 0.9 % NaCl with KCl 40 mEq/L 75 mL/hr at 03/17/23 0912   PRN:acetaminophen  **OR** acetaminophen , hydrALAZINE , morphine   injection, ondansetron  **OR** ondansetron  (ZOFRAN ) IV  Allergies  Allergen Reactions   Diclofenac Sodium    Penicillins Swelling    Has patient had a PCN reaction causing immediate rash, facial/tongue/throat swelling, SOB or lightheadedness with hypotension: NO Has patient had a PCN reaction causing severe rash involving mucus membranes or skin necrosis: no Has patient had a PCN reaction that required hospitalization : no Has patient had a PCN reaction occurring within the last 10 years: no If all of the above answers are NO, then may proceed with Cephalosporin use.     ROS:  A comprehensive review of systems was negative except for: Gastrointestinal: positive for abdominal pain, nausea, and vomiting  Blood pressure (!) 147/61, pulse 72, temperature 97.9 F (36.6 C), temperature source Oral, resp. rate 14, height 5' 6 (1.676 m), weight 61.7 kg, SpO2 97%. Physical Exam Vitals  reviewed.  HENT:     Head: Normocephalic.     Nose: Nose normal.  Eyes:     Extraocular Movements: Extraocular movements intact.  Cardiovascular:     Rate and Rhythm: Normal rate.  Pulmonary:     Effort: Pulmonary effort is normal.  Abdominal:     General: There is distension.     Palpations: Abdomen is soft.     Tenderness: There is no abdominal tenderness.     Comments: Multiple scars, hemorrhoid but no obvious mass, no DRE done   Musculoskeletal:        General: No swelling.     Cervical back: Normal range of motion.  Skin:    General: Skin is warm.  Neurological:     General: No focal deficit present.     Mental Status: She is alert and oriented to person, place, and time.  Psychiatric:        Mood and Affect: Mood normal.        Behavior: Behavior normal.     Results: Results for orders placed or performed during the hospital encounter of 03/16/23 (from the past 48 hours)  Urinalysis, Routine w reflex microscopic -Urine, Clean Catch     Status: Abnormal   Collection Time: 03/16/23  2:31 PM  Result Value Ref Range   Color, Urine YELLOW YELLOW   APPearance CLEAR CLEAR   Specific Gravity, Urine 1.017 1.005 - 1.030   pH 6.0 5.0 - 8.0   Glucose, UA NEGATIVE NEGATIVE mg/dL   Hgb urine dipstick NEGATIVE NEGATIVE   Bilirubin Urine NEGATIVE NEGATIVE   Ketones, ur NEGATIVE NEGATIVE mg/dL   Protein, ur 30 (A) NEGATIVE mg/dL   Nitrite NEGATIVE NEGATIVE   Leukocytes,Ua TRACE (A) NEGATIVE   RBC / HPF 0-5 0 - 5 RBC/hpf   WBC, UA 0-5 0 - 5 WBC/hpf   Bacteria, UA NONE SEEN NONE SEEN   Squamous Epithelial / HPF 0-5 0 - 5 /HPF   Mucus PRESENT    Hyaline Casts, UA PRESENT     Comment: Performed at [facility], [address], [place], [place] [address]  Lipase, blood     Status: None   Collection Time: 03/16/23  2:48 PM  Result Value Ref Range   Lipase 29 11 - 51 U/L    Comment: Performed at [facility], [address] [place], [place] [address]  Comprehensive metabolic  panel     Status: Abnormal   Collection Time: 03/16/23  2:48 PM  Result Value Ref Range   Sodium 129 (L) 135 - 145 mmol/L   Potassium 3.4 (L) 3.5 - 5.1 mmol/L  Chloride 96 (L) 98 - 111 mmol/L   CO2 23 22 - 32 mmol/L   Glucose, Bld 149 (H) 70 - 99 mg/dL    Comment: Glucose reference range applies only to samples taken after fasting for at least 8 hours.   BUN 12 8 - 23 mg/dL   Creatinine, Ser 9.05 0.44 - 1.00 mg/dL   Calcium  8.4 (L) 8.9 - 10.3 mg/dL   Total Protein 7.0 6.5 - 8.1 g/dL   Albumin  3.3 (L) 3.5 - 5.0 g/dL   AST 29 15 - 41 U/L   ALT 16 0 - 44 U/L   Alkaline Phosphatase 53 38 - 126 U/L   Total Bilirubin 1.5 (H) 0.0 - 1.2 mg/dL   GFR, Estimated 53 (L) >60 mL/min    Comment: (NOTE) Calculated using the CKD-EPI Creatinine Equation (2021)    Anion gap 10 5 - 15    Comment: Performed at [facility], [address], [place], [place] [address]  CBC     Status: None   Collection Time: 03/16/23  2:48 PM  Result Value Ref Range   WBC 7.5 4.0 - 10.5 K/uL   RBC 4.21 3.87 - 5.11 MIL/uL   Hemoglobin 12.6 12.0 - 15.0 g/dL   HCT 61.7 63.9 - 53.9 %   MCV 90.7 80.0 - 100.0 fL   MCH 29.9 26.0 - 34.0 pg   MCHC 33.0 30.0 - 36.0 g/dL   RDW 86.8 88.4 - 84.4 %   Platelets 202 150 - 400 K/uL   nRBC 0.0 0.0 - 0.2 %    Comment: Performed at [facility], [address] [place], [place] [address]  Sodium, urine, random     Status: None   Collection Time: 03/16/23 10:49 PM  Result Value Ref Range   Sodium, Ur 32 mmol/L    Comment: Performed at [facility], [address] [place], [place] [address]  Comprehensive metabolic panel     Status: Abnormal   Collection Time: 03/17/23  3:21 AM  Result Value Ref Range   Sodium 131 (L) 135 - 145 mmol/L   Potassium 3.4 (L) 3.5 - 5.1 mmol/L   Chloride 99 98 - 111 mmol/L   CO2 22 22 - 32 mmol/L   Glucose, Bld 100 (H) 70 - 99 mg/dL    Comment: Glucose reference range applies only to samples taken after fasting for at least 8 hours.   BUN 12 8 - 23  mg/dL   Creatinine, Ser 9.22 0.44 - 1.00 mg/dL   Calcium  8.3 (L) 8.9 - 10.3 mg/dL   Total Protein 6.5 6.5 - 8.1 g/dL   Albumin  3.0 (L) 3.5 - 5.0 g/dL   AST 21 15 - 41 U/L   ALT 15 0 - 44 U/L   Alkaline Phosphatase 47 38 - 126 U/L   Total Bilirubin 1.6 (H) 0.0 - 1.2 mg/dL   GFR, Estimated >39 >39 mL/min    Comment: (NOTE) Calculated using the CKD-EPI Creatinine Equation (2021)    Anion gap 10 5 - 15    Comment: Performed at [facility], [address], [place], [place] [address]  CBC     Status: None   Collection Time: 03/17/23  3:21 AM  Result Value Ref Range   WBC 7.6 4.0 - 10.5 K/uL   RBC 4.08 3.87 - 5.11 MIL/uL   Hemoglobin 12.0 12.0 - 15.0 g/dL   HCT 63.2 63.9 - 53.9 %   MCV 90.0 80.0 - 100.0 fL   MCH 29.4  26.0 - 34.0 pg   MCHC 32.7 30.0 - 36.0 g/dL   RDW 86.9 88.4 - 84.4 %   Platelets 195 150 - 400 K/uL   nRBC 0.0 0.0 - 0.2 %    Comment: Performed at [facility], [address] [place], [place] [address]  Magnesium      Status: Abnormal   Collection Time: 03/17/23  3:21 AM  Result Value Ref Range   Magnesium  1.5 (L) 1.7 - 2.4 mg/dL    Comment: Performed at [facility], [address] [place], [place] [address]  Phosphorus     Status: None   Collection Time: 03/17/23  3:21 AM  Result Value Ref Range   Phosphorus 2.6 2.5 - 4.6 mg/dL    Comment: Performed at [facility], [address], [place], [place] [address]   Personally reviewed -small bowel with mass and bowel obstruction  CT ABDOMEN PELVIS W CONTRAST Result Date: 03/16/2023 CLINICAL DATA:  Acute abdominal pain EXAM: CT ABDOMEN AND PELVIS WITH CONTRAST TECHNIQUE: Multidetector CT imaging of the abdomen and pelvis was performed using the standard protocol following bolus administration of intravenous contrast. RADIATION DOSE REDUCTION: This exam was performed according to the departmental dose-optimization program which includes automated exposure control, adjustment of the mA and/or kV according to patient size  and/or use of iterative reconstruction technique. CONTRAST:  OMNIPAQUE  IOHEXOL  300 MG/ML  SOLN COMPARISON:  CT abdomen and pelvis 01/09/2021 FINDINGS: Lower chest: There is a trace left pleural effusion. Hepatobiliary: There are scattered hepatic cysts measuring up to 2.5 cm similar to the prior study. Gallbladder is not seen. There is no biliary ductal dilatation. Pancreas: Unremarkable. No pancreatic ductal dilatation or surrounding inflammatory changes. Spleen: Normal in size without focal abnormality. Adrenals/Urinary Tract: Bilateral renal cysts are present. The largest is in the left kidney measuring 3.7 cm. There is no hydronephrosis or perinephric fat stranding. The adrenal glands and bladder are within normal limits. Stomach/Bowel: There some mildly dilated and borderline dilated small bowel loops with air-fluid levels involving the mid small bowel. Transition point is seen in the distal ileum on image 2/59 where there is an enhancing lesion in the small bowel measuring 1.9 x 0.9 cm image 2/59. Terminal ileum is decompressed. Mild mesenteric edema present. Air-fluid levels are seen throughout small bowel. There is a moderate-sized hiatal hernia. The stomach is decompressed. The colon is nondilated. The appendix is not well seen. There is no pneumatosis or free air. Vascular/Lymphatic: Aortic atherosclerosis. No enlarged abdominal or pelvic lymph nodes. Reproductive: Status post hysterectomy. No adnexal masses. Other: No ascites.  There is presacral edema.  There is no free air. Musculoskeletal: No fracture is seen. IMPRESSION: 1. Small-bowel obstruction with transition point in the distal ileum where there is an enhancing lesion measuring 1.9 x 0.9 cm. Findings are worrisome for neoplasm. 2. Mild mesenteric edema. No pneumatosis or free air. 3. Trace left pleural effusion. 4. Hepatic and renal cysts. No follow-up imaging recommended. 5. Moderate-sized hiatal hernia. 6. Aortic atherosclerosis. Aortic  Atherosclerosis (ICD10-I70.0). Electronically Signed   By: Greig Pique M.D.   On: 03/16/2023 18:37     Assessment & Plan:  Dawn Estrada is a 89 y.o. female with a SBO and mass in the small bowel. She is relatively healthy for 89 with only HTN and hemorrhoids. She is nervous about any surgery. Discussed the obstruction, the mass the concern for cancer. Discussed surgery to remove the small bowel mass and potential need for colon removal if the mass  is too close to the colon. Discussed that we cannot do a bowel prep due to her obstruction. Discussed enemas today. Discussed that she would have a risk of bleeding, infection, anastomotic leak, and issues after anesthesia. Discussed need for rehab after. Discussed that they will talk about surgery with the family and we will talk more.   EKG today Enema today Ice chip ok Ambulation, she may need PT to help with preventing further weakness   All questions were answered to the satisfaction of the patient and family.    Manuelita JAYSON Pander 03/17/2023, 12:04 PM

## 2023-03-17 NOTE — ED Notes (Signed)
 Potassium stopped due to irritation to the pt. MD notified

## 2023-03-17 NOTE — Progress Notes (Signed)
 Pt had large type 7 bowel movement during shift.

## 2023-03-17 NOTE — ED Notes (Signed)
 Spoke with pharmacy about fluids due at 0800. Dawn Estrada they will have it down shortly.

## 2023-03-18 DIAGNOSIS — K6389 Other specified diseases of intestine: Secondary | ICD-10-CM | POA: Diagnosis present

## 2023-03-18 DIAGNOSIS — K56609 Unspecified intestinal obstruction, unspecified as to partial versus complete obstruction: Secondary | ICD-10-CM | POA: Diagnosis not present

## 2023-03-18 LAB — BASIC METABOLIC PANEL
Anion gap: 7 (ref 5–15)
BUN: 7 mg/dL — ABNORMAL LOW (ref 8–23)
CO2: 23 mmol/L (ref 22–32)
Calcium: 8.1 mg/dL — ABNORMAL LOW (ref 8.9–10.3)
Chloride: 105 mmol/L (ref 98–111)
Creatinine, Ser: 0.79 mg/dL (ref 0.44–1.00)
GFR, Estimated: >60 mL/min (ref >60)
Glucose, Bld: 114 mg/dL — ABNORMAL HIGH (ref 70–99)
Potassium: 3.4 mmol/L — ABNORMAL LOW (ref 3.5–5.1)
Sodium: 135 mmol/L (ref 135–145)

## 2023-03-18 LAB — MAGNESIUM: Magnesium: 1.8 mg/dL (ref 1.7–2.4)

## 2023-03-18 MED ORDER — SODIUM CHLORIDE 0.9 % IV SOLN
2.0000 g | INTRAVENOUS | Status: AC
  Filled 2023-03-18: qty 2

## 2023-03-18 MED ORDER — SODIUM CHLORIDE 0.9 % IV SOLN: 2.0000 g | INTRAVENOUS | Status: DC

## 2023-03-18 MED ORDER — CHLORHEXIDINE GLUCONATE CLOTH 2 % EX PADS
6.0000 | MEDICATED_PAD | Freq: Once | CUTANEOUS | Status: AC
  Administered 2023-03-18: 6 via TOPICAL

## 2023-03-18 MED ORDER — KCL IN DEXTROSE-NACL 40-5-0.9 MEQ/L-%-% IV SOLN: INTRAVENOUS | Status: AC

## 2023-03-18 NOTE — Progress Notes (Signed)
 PROGRESS NOTE    Dawn Estrada  FMW:[id number] DOB: 02-Oct-1919 DOA: 03/16/2023 PCP: Maree Isles, MD   Brief Narrative:    Dawn Estrada is a 89 y.o. female with medical history significant of essential hypertension, hiatal hernia, hemorrhoids who presents to the emergency department due to several months onset of intermittent abdominal pain which worsened last night.  Patient was admitted for small bowel obstruction.  General surgery consulted with plans for small bowel resection and possible partial colectomy in a.m.  EKG and 2D echocardiogram ordered for preop evaluation.  Assessment & Plan:   Principal Problem:   Small bowel obstruction (HCC) Active Problems:   Hypokalemia   Essential hypertension   Gastroesophageal reflux disease   Hyponatremia   Hypoalbuminemia   Hiatal hernia   Hemorrhoids   Small bowel mass  Assessment and Plan:   Small bowel obstruction CT abdomen and pelvis with contrast showed small bowel obstruction with transition point in the distal ileum where there is an enhancing lesion measuring 1.9 x 0.9 cm. Findings are worrisome for neoplasm. Appreciate general surgery with plans for SBR and partial colectomy in a.m. EKG and 2D echocardiogram ordered and pending Continue on IV fluid   Hyponatremia-resolved Na 129, no known cause at this time, though may be due to medication effect considering that patient is on HCTZ Continue IV fluid and monitor   Hypokalemia/hypomagnesemia Replete and reevaluate in a.m.   Hypoalbuminemia Albumin  3.3, consider protein supplement when patient resumes oral intake   Essential hypertension Continue IV hydralazine  10 mg every 6 hours as needed for SBP > 170 Consider oral meds when patient resumes oral intake   Hiatal hernia GERD Continue Protonix    Hemorrhoids Continue Preparation H   DVT prophylaxis: SCDs Code Status: Full Family Communication: Son at bedside 2/9 Disposition Plan:  Status is:  Inpatient Remains inpatient appropriate because: Need for IV medication.  Consultants:  General surgery  Procedures:  None  Antimicrobials:  None   Subjective: Patient seen and evaluated today with no new acute complaints or concerns.  She has had a bowel movement after enema, but voices no other complaints.  Family members agreeable to surgery in AM.  Objective: Vitals:   03/17/23 1652 03/17/23 1707 03/17/23 2003 03/18/23 0455  BP: (!) 167/68 (!) 171/73 (!) 159/63 135/65  Pulse: 70 80 92 76  Resp: 16 16 17 18   Temp: 98.7 F (37.1 C) 98.3 F (36.8 C) 98.5 F (36.9 C) 97.8 F (36.6 C)  TempSrc: Oral Oral Oral Oral  SpO2: 100% 98% 98% 97%  Weight:      Height:        Intake/Output Summary (Last 24 hours) at 03/18/2023 1114 Last data filed at 03/17/2023 1500 Gross per 24 hour  Intake 435 ml  Output --  Net 435 ml   Filed Weights   03/16/23 1427  Weight: 61.7 kg    Examination:  General exam: Appears calm and comfortable  Respiratory system: Clear to auscultation. Respiratory effort normal. Cardiovascular system: S1 & S2 heard, RRR.  Gastrointestinal system: Abdomen is soft Central nervous system: Alert and awake Extremities: No edema Skin: No significant lesions noted Psychiatry: Flat affect.    Data Reviewed: I have personally reviewed following labs and imaging studies  CBC: Recent Labs  Lab 03/16/23 1448 03/17/23 0321  WBC 7.5 7.6  HGB 12.6 12.0  HCT 38.2 36.7  MCV 90.7 90.0  PLT 202 195   Basic Metabolic Panel: Recent Labs  Lab 03/16/23 1448 03/17/23  0321 03/18/23 0423  NA 129* 131* 135  K 3.4* 3.4* 3.4*  CL 96* 99 105  CO2 23 22 23   GLUCOSE 149* 100* 114*  BUN 12 12 7*  CREATININE 0.94 0.77 0.79  CALCIUM  8.4* 8.3* 8.1*  MG  --  1.5* 1.8  PHOS  --  2.6  --    GFR: Estimated Creatinine Clearance: 33.3 mL/min (by C-G formula based on SCr of 0.79 mg/dL). Liver Function Tests: Recent Labs  Lab 03/16/23 1448 03/17/23 0321  AST 29  21  ALT 16 15  ALKPHOS 53 47  BILITOT 1.5* 1.6*  PROT 7.0 6.5  ALBUMIN  3.3* 3.0*   Recent Labs  Lab 03/16/23 1448  LIPASE 29   No results for input(s): AMMONIA in the last 168 hours. Coagulation Profile: No results for input(s): INR, PROTIME in the last 168 hours. Cardiac Enzymes: No results for input(s): CKTOTAL, CKMB, CKMBINDEX, TROPONINI in the last 168 hours. BNP (last 3 results) No results for input(s): PROBNP in the last 8760 hours. HbA1C: No results for input(s): HGBA1C in the last 72 hours. CBG: No results for input(s): GLUCAP in the last 168 hours. Lipid Profile: No results for input(s): CHOL, HDL, LDLCALC, TRIG, CHOLHDL, LDLDIRECT in the last 72 hours. Thyroid  Function Tests: No results for input(s): TSH, T4TOTAL, FREET4, T3FREE, THYROIDAB in the last 72 hours. Anemia Panel: No results for input(s): VITAMINB12, FOLATE, FERRITIN, TIBC, IRON, RETICCTPCT in the last 72 hours. Sepsis Labs: No results for input(s): PROCALCITON, LATICACIDVEN in the last 168 hours.  No results found for this or any previous visit (from the past 240 hours).       Radiology Studies: CT ABDOMEN PELVIS W CONTRAST Result Date: 03/16/2023 CLINICAL DATA:  Acute abdominal pain EXAM: CT ABDOMEN AND PELVIS WITH CONTRAST TECHNIQUE: Multidetector CT imaging of the abdomen and pelvis was performed using the standard protocol following bolus administration of intravenous contrast. RADIATION DOSE REDUCTION: This exam was performed according to the departmental dose-optimization program which includes automated exposure control, adjustment of the mA and/or kV according to patient size and/or use of iterative reconstruction technique. CONTRAST:  OMNIPAQUE  IOHEXOL  300 MG/ML  SOLN COMPARISON:  CT abdomen and pelvis 01/09/2021 FINDINGS: Lower chest: There is a trace left pleural effusion. Hepatobiliary: There are scattered hepatic cysts measuring  up to 2.5 cm similar to the prior study. Gallbladder is not seen. There is no biliary ductal dilatation. Pancreas: Unremarkable. No pancreatic ductal dilatation or surrounding inflammatory changes. Spleen: Normal in size without focal abnormality. Adrenals/Urinary Tract: Bilateral renal cysts are present. The largest is in the left kidney measuring 3.7 cm. There is no hydronephrosis or perinephric fat stranding. The adrenal glands and bladder are within normal limits. Stomach/Bowel: There some mildly dilated and borderline dilated small bowel loops with air-fluid levels involving the mid small bowel. Transition point is seen in the distal ileum on image 2/59 where there is an enhancing lesion in the small bowel measuring 1.9 x 0.9 cm image 2/59. Terminal ileum is decompressed. Mild mesenteric edema present. Air-fluid levels are seen throughout small bowel. There is a moderate-sized hiatal hernia. The stomach is decompressed. The colon is nondilated. The appendix is not well seen. There is no pneumatosis or free air. Vascular/Lymphatic: Aortic atherosclerosis. No enlarged abdominal or pelvic lymph nodes. Reproductive: Status post hysterectomy. No adnexal masses. Other: No ascites.  There is presacral edema.  There is no free air. Musculoskeletal: No fracture is seen. IMPRESSION: 1. Small-bowel obstruction with transition point in the  distal ileum where there is an enhancing lesion measuring 1.9 x 0.9 cm. Findings are worrisome for neoplasm. 2. Mild mesenteric edema. No pneumatosis or free air. 3. Trace left pleural effusion. 4. Hepatic and renal cysts. No follow-up imaging recommended. 5. Moderate-sized hiatal hernia. 6. Aortic atherosclerosis. Aortic Atherosclerosis (ICD10-I70.0). Electronically Signed   By: Greig Pique M.D.   On: 03/16/2023 18:37        Scheduled Meds:  ALPRAZolam   0.5-1 mg Oral BID   Chlorhexidine  Gluconate Cloth  6 each Topical Once   And   Chlorhexidine  Gluconate Cloth  6 each  Topical Once   hydrocortisone  cream   Topical BID   metoprolol  tartrate  50 mg Oral BID   pantoprazole  (PROTONIX ) IV  40 mg Intravenous Q24H   Continuous Infusions:  [START ON 03/19/2023] cefoTEtan  (CEFOTAN ) IV     dextrose  5 % and 0.9 % NaCl with KCl 40 mEq/L 75 mL/hr at 03/18/23 0819     LOS: 2 days    Time spent: 55 minutes    Dari Carpenito D Maree, DO [facility]  If 7PM-7AM, please contact night-coverage [email or web address] 03/18/2023, 11:14 AM

## 2023-03-18 NOTE — Progress Notes (Signed)
 [facility] Progress Note     Subjective: Doing fair. Her son Dawn Estrada in the room. We facetime Dawn Estrada. No complaints. Had Bm after enema.   Objective: Vital signs in last 24 hours: Temp:  [97.8 F (36.6 C)-98.7 F (37.1 C)] 97.8 F (36.6 C) (02/09 0455) Pulse Rate:  [70-92] 76 (02/09 0455) Resp:  [16-18] 18 (02/09 0455) BP: (135-171)/(63-74) 135/65 (02/09 0455) SpO2:  [97 %-100 %] 97 % (02/09 0455) Last BM Date : 03/17/23  Intake/Output from previous day: 02/08 0701 - 02/09 0700 In: 435 [I.V.:435] Out: -  Intake/Output this shift: No intake/output data recorded.  General appearance: alert and no distress Resp: normal work of breathing GI: soft, mildly distended, nontender   Lab Results:  Recent Labs    03/16/23 1448 03/17/23 0321  WBC 7.5 7.6  HGB 12.6 12.0  HCT 38.2 36.7  PLT 202 195   BMET Recent Labs    03/17/23 0321 03/18/23 0423  NA 131* 135  K 3.4* 3.4*  CL 99 105  CO2 22 23  GLUCOSE 100* 114*  BUN 12 7*  CREATININE 0.77 0.79  CALCIUM  8.3* 8.1*   PT/INR No results for input(s): LABPROT, INR in the last 72 hours.  Studies/Results: CT ABDOMEN PELVIS W CONTRAST Result Date: 03/16/2023 CLINICAL DATA:  Acute abdominal pain EXAM: CT ABDOMEN AND PELVIS WITH CONTRAST TECHNIQUE: Multidetector CT imaging of the abdomen and pelvis was performed using the standard protocol following bolus administration of intravenous contrast. RADIATION DOSE REDUCTION: This exam was performed according to the departmental dose-optimization program which includes automated exposure control, adjustment of the mA and/or kV according to patient size and/or use of iterative reconstruction technique. CONTRAST:  OMNIPAQUE  IOHEXOL  300 MG/ML  SOLN COMPARISON:  CT abdomen and pelvis 01/09/2021 FINDINGS: Lower chest: There is a trace left pleural effusion. Hepatobiliary: There are scattered hepatic cysts measuring up to 2.5 cm similar to the prior study.  Gallbladder is not seen. There is no biliary ductal dilatation. Pancreas: Unremarkable. No pancreatic ductal dilatation or surrounding inflammatory changes. Spleen: Normal in size without focal abnormality. Adrenals/Urinary Tract: Bilateral renal cysts are present. The largest is in the left kidney measuring 3.7 cm. There is no hydronephrosis or perinephric fat stranding. The adrenal glands and bladder are within normal limits. Stomach/Bowel: There some mildly dilated and borderline dilated small bowel loops with air-fluid levels involving the mid small bowel. Transition point is seen in the distal ileum on image 2/59 where there is an enhancing lesion in the small bowel measuring 1.9 x 0.9 cm image 2/59. Terminal ileum is decompressed. Mild mesenteric edema present. Air-fluid levels are seen throughout small bowel. There is a moderate-sized hiatal hernia. The stomach is decompressed. The colon is nondilated. The appendix is not well seen. There is no pneumatosis or free air. Vascular/Lymphatic: Aortic atherosclerosis. No enlarged abdominal or pelvic lymph nodes. Reproductive: Status post hysterectomy. No adnexal masses. Other: No ascites.  There is presacral edema.  There is no free air. Musculoskeletal: No fracture is seen. IMPRESSION: 1. Small-bowel obstruction with transition point in the distal ileum where there is an enhancing lesion measuring 1.9 x 0.9 cm. Findings are worrisome for neoplasm. 2. Mild mesenteric edema. No pneumatosis or free air. 3. Trace left pleural effusion. 4. Hepatic and renal cysts. No follow-up imaging recommended. 5. Moderate-sized hiatal hernia. 6. Aortic atherosclerosis. Aortic Atherosclerosis (ICD10-I70.0). Electronically Signed   By: Greig Pique M.D.   On: 03/16/2023 18:37    Anti-infectives: Anti-infectives (From admission, onward)  None       Assessment/Plan: Patient with Small bowel mass and obstruction. Discussed with her sons the surgery and possible SBR versus  SBR and partial colectomy, discussed risk of bleeding, infection, risk of anesthesia, risk of anastomotic leak, risk of finding something like cancer given the mass.   They want to proceed. EKG and ECHO preop NPO midnight Preop orders placed    LOS: 2 days    Dawn Estrada 03/18/2023

## 2023-03-18 NOTE — Evaluation (Signed)
 Physical Therapy Evaluation Patient Details Name: Dawn Estrada MRN: [id number] DOB: 03/18/1919 Today's Date: 03/18/2023  History of Present Illness  PEr MD: Dawn Estrada is a 89 y.o. female with medical history significant of essential hypertension, hiatal hernia, hemorrhoids who presents to the emergency department due to several months onset of intermittent abdominal pain which worsened last night.  Pain was mid abdominal and was rated as 10/10 on pain scale, this was associated with nausea, vomiting and some diarrhea last night.  There was no vomiting or diarrhea today.  Family was worried about dehydration and she was taken to the ED for further evaluation and management.  Clinical Impression  Pt did very well with mobility, is slow but this is expected due to pt age.          If plan is discharge home, recommend the following: Help with stairs or ramp for entrance;A lot of help with walking and/or transfers   Can travel by private vehicle    yes    Equipment Recommendations Other (comment) (shower chair)  Recommendations for Other Services   none    Functional Status Assessment Patient has had a recent decline in their functional status and demonstrates the ability to make significant improvements in function in a reasonable and predictable amount of time.     Precautions / Restrictions Precautions Precautions: None Restrictions Weight Bearing Restrictions Per Provider Order: No      Mobility  Bed Mobility Overal bed mobility: Modified Independent                  Transfers Overall transfer level: Modified independent                      Ambulation/Gait Ambulation/Gait assistance: Modified independent (Device/Increase time) Gait Distance (Feet): 90 Feet Assistive device: Rolling walker (2 wheels) Gait Pattern/deviations: Decreased step length - right, Decreased step length - left, Trunk flexed               Pertinent Vitals/Pain  Pain Assessment Pain Assessment: 0-10 Pain Score: 4  Pain Location: abdominal Pain Descriptors / Indicators: Cramping Pain Intervention(s): Limited activity within patient's tolerance    Home Living Family/patient expects to be discharged to:: Private residence Living Arrangements: Children Available Help at Discharge: Available 24 hours/day Type of Home: House Home Access: Stairs to enter   Secretary/administrator of Steps: 1   Home Layout: One level Home Equipment: Standard Walker;Grab bars - tub/shower      Prior Function Prior Level of Function : Independent/Modified Independent             Mobility Comments: uses walker for household mobility       Extremity/Trunk Assessment        Lower Extremity Assessment Lower Extremity Assessment: Overall WFL for tasks assessed       Communication   Communication Communication: No apparent difficulties  Cognition Arousal: Alert Behavior During Therapy: WFL for tasks assessed/performed Overall Cognitive Status: Within Functional Limits for tasks assessed                                                 Assessment/Plan    PT Assessment Patient needs continued PT services  PT Problem List Decreased activity tolerance       PT Treatment Interventions Gait training;Therapeutic exercise    PT  Goals (Current goals can be found in the Care Plan section)  Acute Rehab PT Goals Patient Stated Goal: to go home PT Goal Formulation: With patient Time For Goal Achievement: 03/21/23 Potential to Achieve Goals: Good    Frequency Min 2X/week        AM-PAC PT 6 Clicks Mobility  Outcome Measure Help needed turning from your back to your side while in a flat bed without using bedrails?: None Help needed moving from lying on your back to sitting on the side of a flat bed without using bedrails?: None Help needed moving to and from a bed to a chair (including a wheelchair)?: None Help needed standing  up from a chair using your arms (e.g., wheelchair or bedside chair)?: None Help needed to walk in hospital room?: A Estrada Help needed climbing 3-5 steps with a railing? : A Estrada 6 Click Score: 22    End of Session Equipment Utilized During Treatment: Gait belt Activity Tolerance: Patient tolerated treatment well Patient left: in bed;with call bell/phone within reach;with family/visitor present Nurse Communication: Mobility status PT Visit Diagnosis: Unsteadiness on feet (R26.81)    Time: 1030-1101 PT Time Calculation (min) (ACUTE ONLY): 31 min   Charges:   PT Evaluation $PT Eval Low Complexity: 1 Low PT Treatments $Gait Training: 8-22 mins PT General Charges $$ ACUTE PT VISIT: 1 Visit         Montie Metro, PT CLT [phone]  03/18/2023, 11:02 AM

## 2023-03-18 NOTE — Plan of Care (Signed)

## 2023-03-19 ENCOUNTER — Inpatient Hospital Stay (HOSPITAL_COMMUNITY): Payer: Medicare Other

## 2023-03-19 ENCOUNTER — Other Ambulatory Visit: Payer: Self-pay

## 2023-03-19 ENCOUNTER — Other Ambulatory Visit (HOSPITAL_COMMUNITY): Payer: Self-pay | Admitting: *Deleted

## 2023-03-19 DIAGNOSIS — K6389 Other specified diseases of intestine: Secondary | ICD-10-CM | POA: Diagnosis not present

## 2023-03-19 DIAGNOSIS — Z01810 Encounter for preprocedural cardiovascular examination: Secondary | ICD-10-CM | POA: Diagnosis not present

## 2023-03-19 DIAGNOSIS — K56609 Unspecified intestinal obstruction, unspecified as to partial versus complete obstruction: Secondary | ICD-10-CM | POA: Diagnosis not present

## 2023-03-19 LAB — CBC
HCT: 32.9 % — ABNORMAL LOW (ref 36.0–46.0)
Hemoglobin: 10.9 g/dL — ABNORMAL LOW (ref 12.0–15.0)
MCH: 30.2 pg (ref 26.0–34.0)
MCHC: 33.1 g/dL (ref 30.0–36.0)
MCV: 91.1 fL (ref 80.0–100.0)
Platelets: 172 10*3/uL (ref 150–400)
RBC: 3.61 MIL/uL — ABNORMAL LOW (ref 3.87–5.11)
RDW: 13.1 % (ref 11.5–15.5)
WBC: 5.7 10*3/uL (ref 4.0–10.5)
nRBC: 0 % (ref 0.0–0.2)

## 2023-03-19 LAB — COMPREHENSIVE METABOLIC PANEL
ALT: 12 U/L (ref 0–44)
AST: 20 U/L (ref 15–41)
Albumin: 3 g/dL — ABNORMAL LOW (ref 3.5–5.0)
Alkaline Phosphatase: 46 U/L (ref 38–126)
Anion gap: 8 (ref 5–15)
BUN: <5 mg/dL — ABNORMAL LOW (ref 8–23)
CO2: 21 mmol/L — ABNORMAL LOW (ref 22–32)
Calcium: 8.4 mg/dL — ABNORMAL LOW (ref 8.9–10.3)
Chloride: 108 mmol/L (ref 98–111)
Creatinine, Ser: 0.65 mg/dL (ref 0.44–1.00)
GFR, Estimated: >60 mL/min (ref >60)
Glucose, Bld: 94 mg/dL (ref 70–99)
Potassium: 4 mmol/L (ref 3.5–5.1)
Sodium: 137 mmol/L (ref 135–145)
Total Bilirubin: 1.1 mg/dL (ref 0.0–1.2)
Total Protein: 6.1 g/dL — ABNORMAL LOW (ref 6.5–8.1)

## 2023-03-19 LAB — PROCALCITONIN: Procalcitonin: <0.1 ng/mL

## 2023-03-19 LAB — RESP PANEL BY RT-PCR (RSV, FLU A&B, COVID)  RVPGX2
Influenza A by PCR: NEGATIVE
Influenza B by PCR: NEGATIVE
Resp Syncytial Virus by PCR: NEGATIVE
SARS Coronavirus 2 by RT PCR: POSITIVE — AB

## 2023-03-19 LAB — ECHOCARDIOGRAM COMPLETE
Area-P 1/2: 2.48 cm2
Est EF: >75
Height: 66 in
S' Lateral: 1.8 cm
Weight: 2176 [oz_av]

## 2023-03-19 LAB — GLUCOSE, CAPILLARY: Glucose-Capillary: 115 mg/dL — ABNORMAL HIGH (ref 70–99)

## 2023-03-19 LAB — MAGNESIUM: Magnesium: 1.6 mg/dL — ABNORMAL LOW (ref 1.7–2.4)

## 2023-03-19 LAB — LACTIC ACID, PLASMA: Lactic Acid, Venous: 0.7 mmol/L (ref 0.5–1.9)

## 2023-03-19 MED ORDER — MAGNESIUM SULFATE 2 GM/50ML IV SOLN
2.0000 g | Freq: Once | INTRAVENOUS | Status: AC
  Administered 2023-03-19: 2 g via INTRAVENOUS
  Filled 2023-03-19: qty 50

## 2023-03-19 MED ORDER — FAT EMUL FISH OIL/PLANT BASED 20% (SMOFLIPID)IV EMUL
250.0000 mL | INTRAVENOUS | Status: AC
  Administered 2023-03-19: 250 mL via INTRAVENOUS
  Filled 2023-03-19: qty 250

## 2023-03-19 MED ORDER — SODIUM CHLORIDE 0.9 % IV SOLN
100.0000 mg | Freq: Every day | INTRAVENOUS | Status: AC
  Administered 2023-03-20 – 2023-03-21 (×2): 100 mg via INTRAVENOUS
  Filled 2023-03-19 (×2): qty 100

## 2023-03-19 MED ORDER — INSULIN ASPART 100 UNIT/ML IJ SOLN
0.0000 [IU] | Freq: Four times a day (QID) | INTRAMUSCULAR | Status: DC
  Administered 2023-03-20 – 2023-03-21 (×4): 1 [IU] via SUBCUTANEOUS
  Administered 2023-03-22: 2 [IU] via SUBCUTANEOUS
  Administered 2023-03-22: 3 [IU] via SUBCUTANEOUS
  Administered 2023-03-22: 1 [IU] via SUBCUTANEOUS
  Administered 2023-03-23: 3 [IU] via SUBCUTANEOUS
  Administered 2023-03-23 (×2): 2 [IU] via SUBCUTANEOUS
  Administered 2023-03-23: 1 [IU] via SUBCUTANEOUS
  Administered 2023-03-23 – 2023-03-24 (×2): 3 [IU] via SUBCUTANEOUS

## 2023-03-19 MED ORDER — SODIUM CHLORIDE 0.9% FLUSH: 10.0000 mL | INTRAVENOUS | Status: DC | PRN

## 2023-03-19 MED ORDER — SODIUM CHLORIDE 0.9 % IV SOLN: 100.0000 mg | Freq: Every day | INTRAVENOUS | Status: DC

## 2023-03-19 MED ORDER — TRACE MINERALS CU-MN-SE-ZN 300-55-60-3000 MCG/ML IV SOLN
INTRAVENOUS | Status: AC
  Filled 2023-03-19: qty 960

## 2023-03-19 MED ORDER — SODIUM CHLORIDE 0.9 % IV SOLN: 200.0000 mg | Freq: Once | INTRAVENOUS | Status: DC

## 2023-03-19 MED ORDER — SODIUM CHLORIDE 0.9 % IV SOLN
100.0000 mg | INTRAVENOUS | Status: AC
  Administered 2023-03-19 (×2): 100 mg via INTRAVENOUS
  Filled 2023-03-19 (×2): qty 100

## 2023-03-19 MED ORDER — SODIUM CHLORIDE 0.9% FLUSH
10.0000 mL | Freq: Two times a day (BID) | INTRAVENOUS | Status: DC
  Administered 2023-03-19 – 2023-03-23 (×7): 10 mL
  Administered 2023-03-24: 20 mL
  Administered 2023-03-24 – 2023-03-28 (×9): 10 mL
  Administered 2023-03-29: 20 mL

## 2023-03-19 MED ORDER — CHLORHEXIDINE GLUCONATE CLOTH 2 % EX PADS
6.0000 | MEDICATED_PAD | Freq: Every day | CUTANEOUS | Status: DC
  Administered 2023-03-19 – 2023-03-29 (×10): 6 via TOPICAL

## 2023-03-19 NOTE — Plan of Care (Signed)
   Problem: Education: Goal: Knowledge of risk factors and measures for prevention of condition will improve Outcome: Progressing   Problem: Education: Goal: Knowledge of risk factors and measures for prevention of condition will improve Outcome: Progressing

## 2023-03-19 NOTE — Plan of Care (Signed)
  Problem: Health Behavior/Discharge Planning: Goal: Ability to manage health-related needs will improve Outcome: Progressing   Problem: Clinical Measurements: Goal: Ability to maintain clinical measurements within normal limits will improve Outcome: Progressing Goal: Will remain free from infection Outcome: Progressing Goal: Diagnostic test results will improve Outcome: Progressing   Problem: Safety: Goal: Ability to remain free from injury will improve Outcome: Progressing   Problem: Skin Integrity: Goal: Risk for impaired skin integrity will decrease Outcome: Progressing

## 2023-03-19 NOTE — Progress Notes (Signed)
 PROGRESS NOTE    Dawn Estrada  ZOX:[id number] DOB: 24-Oct-1919 DOA: 03/16/2023 PCP: Theoplis Fix, MD   Brief Narrative:    Dawn Estrada is a 89 y.o. female with medical history significant of essential hypertension, hiatal hernia, hemorrhoids who presents to the emergency department due to several months onset of intermittent abdominal pain which worsened last night.  Patient was admitted for small bowel obstruction.  General surgery consulted with plans for small bowel resection and possible partial colectomy in a.m.  EKG and 2D echocardiogram ordered for preop evaluation pending.  Now unfortunately appears to have worsening fever which requires further evaluation.  Operation canceled and plan to place PICC line as well as TPN for nutrition for now.  Assessment & Plan:   Principal Problem:   Small bowel obstruction (HCC) Active Problems:   Hypokalemia   Essential hypertension   Gastroesophageal reflux disease   Hyponatremia   Hypoalbuminemia   Hiatal hernia   Hemorrhoids   Small bowel mass  Assessment and Plan:   Small bowel obstruction CT abdomen and pelvis with contrast showed small bowel obstruction with transition point in the distal ileum where there is an enhancing lesion measuring 1.9 x 0.9 cm. Findings are worrisome for neoplasm. Appreciate general surgery ongoing evaluation EKG and 2D echocardiogram ordered and pending Plan to cancel SBR and partial colectomy for now given worsening fever PICC line and TPN to be placed  Fever Noted to have some SIRS criteria which is quite concerning Tylenol  to be given as suppository Further evaluation with urinalysis, chest x-ray and blood cultures pending Procalcitonin ordered and pending Check flu/respiratory panel   Hyponatremia-resolved Na 129, no known cause at this time, though may be due to medication effect considering that patient is on HCTZ Continue IV fluid and monitor   Hypoalbuminemia Albumin  3.3,  consider protein supplement when patient resumes oral intake   Essential hypertension Continue IV hydralazine  10 mg every 6 hours as needed for SBP > 170 Consider oral meds when patient resumes oral intake   Hiatal hernia GERD Continue Protonix    Hemorrhoids Continue Preparation H   DVT prophylaxis: SCDs Code Status: Full Family Communication: Son at bedside 2/10 Disposition Plan:  Status is: Inpatient Remains inpatient appropriate because: Need for IV medication.  Consultants:  General surgery  Procedures:  None  Antimicrobials:  None   Subjective: Patient seen and evaluated today with no new acute complaints or concerns.  She has been resting comfortably and surgery was anticipated as she has remained n.p.o., however she has developed fever as well as some tachypnea and tachycardia this morning.  Objective: Vitals:   03/19/23 0501 03/19/23 0822 03/19/23 0848 03/19/23 1012  BP: 131/61 (!) 145/57 129/61 (!) 116/47  Pulse: 92 (!) 103 (!) 102 97  Resp:  (!) 24 (!) 47 (!) 31  Temp: 97.7 F (36.5 C) (!) 102.9 F (39.4 C) (!) 102.7 F (39.3 C) (!) 102.8 F (39.3 C)  TempSrc: Oral Oral Rectal Oral  SpO2: 98%   96%  Weight:      Height:        Intake/Output Summary (Last 24 hours) at 03/19/2023 1104 Last data filed at 03/18/2023 1755 Gross per 24 hour  Intake 711.86 ml  Output --  Net 711.86 ml   Filed Weights   03/16/23 1427  Weight: 61.7 kg    Examination:  General exam: Appears calm and comfortable  Respiratory system: Clear to auscultation. Respiratory effort normal. Cardiovascular system: S1 & S2 heard, RRR.  Gastrointestinal system: Abdomen is soft Central nervous system: Alert and awake Extremities: No edema Skin: No significant lesions noted Psychiatry: Flat affect.    Data Reviewed: I have personally reviewed following labs and imaging studies  CBC: Recent Labs  Lab 03/16/23 1448 03/17/23 0321 03/19/23 0555  WBC 7.5 7.6 5.7  HGB 12.6  12.0 10.9*  HCT 38.2 36.7 32.9*  MCV 90.7 90.0 91.1  PLT 202 195 172   Basic Metabolic Panel: Recent Labs  Lab 03/16/23 1448 03/17/23 0321 03/18/23 0423 03/19/23 0233  NA 129* 131* 135 137  K 3.4* 3.4* 3.4* 4.0  CL 96* 99 105 108  CO2 23 22 23  21*  GLUCOSE 149* 100* 114* 94  BUN 12 12 7* <5*  CREATININE 0.94 0.77 0.79 0.65  CALCIUM  8.4* 8.3* 8.1* 8.4*  MG  --  1.5* 1.8 1.6*  PHOS  --  2.6  --   --    GFR: Estimated Creatinine Clearance: 33.3 mL/min (by C-G formula based on SCr of 0.65 mg/dL). Liver Function Tests: Recent Labs  Lab 03/16/23 1448 03/17/23 0321 03/19/23 0233  AST 29 21 20   ALT 16 15 12   ALKPHOS 53 47 46  BILITOT 1.5* 1.6* 1.1  PROT 7.0 6.5 6.1*  ALBUMIN  3.3* 3.0* 3.0*   Recent Labs  Lab 03/16/23 1448  LIPASE 29   No results for input(s): "AMMONIA" in the last 168 hours. Coagulation Profile: No results for input(s): "INR", "PROTIME" in the last 168 hours. Cardiac Enzymes: No results for input(s): "CKTOTAL", "CKMB", "CKMBINDEX", "TROPONINI" in the last 168 hours. BNP (last 3 results) No results for input(s): "PROBNP" in the last 8760 hours. HbA1C: No results for input(s): "HGBA1C" in the last 72 hours. CBG: No results for input(s): "GLUCAP" in the last 168 hours. Lipid Profile: No results for input(s): "CHOL", "HDL", "LDLCALC", "TRIG", "CHOLHDL", "LDLDIRECT" in the last 72 hours. Thyroid  Function Tests: No results for input(s): "TSH", "T4TOTAL", "FREET4", "T3FREE", "THYROIDAB" in the last 72 hours. Anemia Panel: No results for input(s): "VITAMINB12", "FOLATE", "FERRITIN", "TIBC", "IRON", "RETICCTPCT" in the last 72 hours. Sepsis Labs: No results for input(s): "PROCALCITON", "LATICACIDVEN" in the last 168 hours.  Recent Results (from the past 240 hours)  Culture, blood (Routine X 2) w Reflex to ID Panel     Status: None (Preliminary result)   Collection Time: 03/19/23  9:47 AM   Specimen: BLOOD  Result Value Ref Range Status   Specimen  Description BLOOD BLOOD LEFT FOREARM  Final   Special Requests   Final    BOTTLES DRAWN AEROBIC ONLY Blood Culture adequate volume Performed at [facility], [address], [place], [place] [address]    Culture PENDING  Incomplete   Report Status PENDING  Incomplete  Culture, blood (Routine X 2) w Reflex to ID Panel     Status: None (Preliminary result)   Collection Time: 03/19/23  9:52 AM   Specimen: BLOOD  Result Value Ref Range Status   Specimen Description BLOOD BLOOD LEFT HAND  Final   Special Requests   Final    BOTTLES DRAWN AEROBIC ONLY Blood Culture adequate volume Performed at [facility], [address] [place], [place] [address]    Culture PENDING  Incomplete   Report Status PENDING  Incomplete         Radiology Studies: US  EKG SITE RITE Result Date: 03/19/2023 If Site Rite image not attached, placement could not be confirmed due to current cardiac rhythm.       Scheduled Meds:  ALPRAZolam   0.5-1 mg Oral BID   hydrocortisone  cream   Topical BID   metoprolol  tartrate  50 mg Oral BID   pantoprazole  (PROTONIX ) IV  40 mg Intravenous Q24H   Continuous Infusions:  cefoTEtan  (CEFOTAN ) IV       LOS: 3 days    Time spent: 55 minutes    Franky Reier Loran Rock, DO [facility]  If 7PM-7AM, please contact night-coverage [email or web address] 03/19/2023, 11:04 AM

## 2023-03-19 NOTE — Progress Notes (Signed)
   03/19/23 2116  Assess: MEWS Score  Temp 99.8 F (37.7 C)  BP 129/62  MAP (mmHg) 80  Pulse Rate 95  Resp (!) 40  SpO2 96 %  O2 Device Room Air  Assess: MEWS Score  MEWS Temp 0  MEWS Systolic 0  MEWS Pulse 0  MEWS RR 3  MEWS LOC 0  MEWS Score 3  MEWS Score Color Yellow  Assess: if the MEWS score is Yellow or Red  Were vital signs accurate and taken at a resting state? Yes  Does the patient meet 2 or more of the SIRS criteria? Yes  Does the patient have a confirmed or suspected source of infection? Yes  MEWS guidelines implemented  Yes, yellow  Treat  MEWS Interventions Considered administering scheduled or prn medications/treatments as ordered  Take Vital Signs  Increase Vital Sign Frequency  Yellow: Q2hr x1, continue Q4hrs until patient remains green for 12hrs  Escalate  MEWS: Escalate Yellow: Discuss with charge nurse and consider notifying provider and/or RRT  Notify: Charge Nurse/RN  Name of Charge Nurse/RN Notified Apolinar Baxter RN  Provider Notification  Provider Name/Title Dr. Elyse Hand  Date Provider Notified 03/19/23  Time Provider Notified 2120  Method of Notification Page (secure chat)  Notification Reason Other (Comment) (yellow MEWs)  Provider response No new orders  Date of Provider Response 03/19/23  Time of Provider Response 2130  Assess: SIRS CRITERIA  SIRS Temperature  0  SIRS Respirations  1  SIRS Pulse 1  SIRS WBC 0  SIRS Score Sum  2

## 2023-03-19 NOTE — Progress Notes (Signed)
 MEWS Progress Note  Patient Details Name: IZABELLA HANSEN MRN: [id number] DOB: 03-25-1919 Today's Date: 03/19/2023   MEWS Flowsheet Documentation:  Assess: MEWS Score Temp: 99.9 F (37.7 C) BP: (!) 145/83 MAP (mmHg): 100 Pulse Rate: (!) 101 Resp: (!) 38 Level of Consciousness: Alert SpO2: 97 % O2 Device: Room Air Assess: MEWS Score MEWS Temp: 0 MEWS Systolic: 0 MEWS Pulse: 1 MEWS RR: 3 MEWS LOC: 0 MEWS Score: 4 MEWS Score Color: Red Assess: SIRS CRITERIA SIRS Temperature : 0 SIRS Respirations : 1 SIRS Pulse: 1 SIRS WBC: 0 SIRS Score Sum : 2 Assess: if the MEWS score is Yellow or Red Were vital signs accurate and taken at a resting state?: Yes Does the patient meet 2 or more of the SIRS criteria?: Yes Does the patient have a confirmed or suspected source of infection?: Yes (COVID) MEWS guidelines implemented : Yes, red Treat MEWS Interventions: Considered administering scheduled or prn medications/treatments as ordered Take Vital Signs Increase Vital Sign Frequency : Red: Q1hr x2, continue Q4hrs until patient remains green for 12hrs Escalate MEWS: Escalate: Red: Discuss with charge nurse and notify provider. Consider notifying RRT. If remains red for 2 hours consider need for higher level of care Notify: Charge Nurse/RN Name of Charge Nurse/RN Notified: shannon brown RN Provider Notification Provider Name/Title: Dr. Elyse Hand Date Provider Notified: 03/19/23 Time Provider Notified: 2020 Method of Notification: Page (secure chat) Notification Reason: Critical Result      Maryruth Sol 03/19/2023, 8:20 PM

## 2023-03-19 NOTE — Progress Notes (Signed)
 Peripherally Inserted Central Catheter Placement  The IV Nurse has discussed with the patient and/or persons authorized to consent for the patient, the purpose of this procedure and the potential benefits and risks involved with this procedure.  The benefits include less needle sticks, lab draws from the catheter, and the patient may be discharged home with the catheter. Risks include, but not limited to, infection, bleeding, blood clot (thrombus formation), and puncture of an artery; nerve damage and irregular heartbeat and possibility to perform a PICC exchange if needed/ordered by physician.  Alternatives to this procedure were also discussed.  Bard Power PICC patient education guide, fact sheet on infection prevention and patient information card has been provided to patient /or left at bedside.    PICC Placement Documentation  PICC Double Lumen 03/19/23 Right Basilic 38 cm 1 cm (Active)  Indication for Insertion or Continuance of Line Administration of hyperosmolar/irritating solutions (i.e. TPN, Vancomycin, etc.) 03/19/23 1800  Exposed Catheter (cm) 1 cm 03/19/23 1800  Site Assessment Clean, Dry, Intact 03/19/23 1800  Lumen #1 Status Flushed;Saline locked;Blood return noted 03/19/23 1800  Lumen #2 Status Flushed;Saline locked;Blood return noted 03/19/23 1800  Dressing Type Transparent;Securing device 03/19/23 1800  Dressing Status Antimicrobial disc/dressing in place;Clean, Dry, Intact 03/19/23 1800  Line Care Connections checked and tightened 03/19/23 1800  Line Adjustment (NICU/IV Team Only) No 03/19/23 1800  Dressing Intervention New dressing 03/19/23 1800  Dressing Change Due 03/26/23 03/19/23 1800       Dru Georges 03/19/2023, 6:39 PM

## 2023-03-19 NOTE — Progress Notes (Signed)
 Nurse at bedside,patient place on [employer] and [employer] and family educated verbalized understanding. Educational care notes also given.Plan of care on going.

## 2023-03-19 NOTE — Progress Notes (Addendum)
 Nurse at bedside,patient is alert to person,place,and time,confused to situation.Rectal temp 102.7,blood pressure 129/61,heart rate 102,respirations 47.Dr Mason Sole notified.New IV restarted 22 gauge to right hand,patient tolerated procedure.Orders received and given.Plan of care on going. Family at bedside.

## 2023-03-19 NOTE — Progress Notes (Signed)
 Nurse at bedside,assisting patient to bathroom using front wheel walker, patient has generalized weakness but tolerated ambulation to and from bathroom without difficulty.Family at bedside. Plan of care on going.

## 2023-03-19 NOTE — Progress Notes (Signed)
 [facility] Progress Note  * Surgery Date in Future *  Subjective: Patient with fevers today. Workup being done and COVID is positive. Canceled surgery to for today after this developments. Discussed with Tony Frederickson.   Objective: Vital signs in last 24 hours: Temp:  [97.7 F (36.5 C)-102.9 F (39.4 C)] 99.8 F (37.7 C) (02/10 1127) Pulse Rate:  [78-103] 95 (02/10 1127) Resp:  [17-47] 30 (02/10 1127) BP: (111-155)/(47-78) 111/56 (02/10 1127) SpO2:  [96 %-99 %] 97 % (02/10 1127) Last BM Date : 03/17/23  Intake/Output from previous day: 02/09 0701 - 02/10 0700 In: 711.9 [I.V.:711.9] Out: -  Intake/Output this shift: No intake/output data recorded.  General appearance: alert and no distress GI: soft, nontender, mildly distended   Lab Results:  Recent Labs    03/17/23 0321 03/19/23 0555  WBC 7.6 5.7  HGB 12.0 10.9*  HCT 36.7 32.9*  PLT 195 172   BMET Recent Labs    03/18/23 0423 03/19/23 0233  NA 135 137  K 3.4* 4.0  CL 105 108  CO2 23 21*  GLUCOSE 114* 94  BUN 7* <5*  CREATININE 0.79 0.65  CALCIUM  8.1* 8.4*   PT/INR No results for input(s): "LABPROT", "INR" in the last 72 hours.  Studies/Results: ECHOCARDIOGRAM COMPLETE Result Date: 03/19/2023    ECHOCARDIOGRAM REPORT   Patient Name:   Dawn Estrada Date of Exam: 03/19/2023 Medical Rec #:  [id number]         Height:       66.0 in Accession #:    [id number]        Weight:       136.0 lb Date of Birth:  1919/08/06         BSA:          1.697 m Patient Age:    89 years         BP:           131/61 mmHg Patient Gender: F                 HR:           92 bpm. Exam Location:  [facility] Procedure: 2D Echo, Cardiac Doppler and Color Doppler Indications:    Pre-OP clearance  History:        Patient has prior history of Echocardiogram examinations, most                 recent 11/26/2014. Risk Factors:Hypertension. GERD.  Sonographer:    Denese Finn RCS Referring Phys: [id number] PRATIK D [facility]  IMPRESSIONS  1. Left ventricular intracavitary gradient of 30 mm Hg ia noted at rest likely secondary to hyperdynamic left ventricle. Left ventricular ejection fraction, by estimation, is >75%. The left ventricle has hyperdynamic function. The left ventricle has no regional wall motion abnormalities. There is mild left ventricular hypertrophy. Left ventricular diastolic parameters are consistent with Grade I diastolic dysfunction (impaired relaxation). Elevated left ventricular end-diastolic pressure.  2. Right ventricular systolic function is normal. The right ventricular size is normal. Tricuspid regurgitation signal is inadequate for assessing PA pressure.  3. Left atrial size was moderately dilated.  4. The mitral valve is normal in structure. Trivial mitral valve regurgitation. No evidence of mitral stenosis.  5. The aortic valve is tricuspid. Aortic valve regurgitation is not visualized. No aortic stenosis is present.  6. The inferior vena cava is normal in size with greater than 50% respiratory variability, suggesting right atrial pressure of 3  mmHg.  7. Increased flow velocities may be secondary to anemia, thyrotoxicosis, hyperdynamic or high flow state. Comparison(s): No prior Echocardiogram. FINDINGS  Left Ventricle: Left ventricular ejection fraction, by estimation, is >75%. The left ventricle has hyperdynamic function. The left ventricle has no regional wall motion abnormalities. The left ventricular internal cavity size was normal in size. There is mild left ventricular hypertrophy. Left ventricular diastolic parameters are consistent with Grade I diastolic dysfunction (impaired relaxation). Elevated left ventricular end-diastolic pressure. Right Ventricle: The right ventricular size is normal. No increase in right ventricular wall thickness. Right ventricular systolic function is normal. Tricuspid regurgitation signal is inadequate for assessing PA pressure. Left Atrium: Left atrial size was moderately  dilated. Right Atrium: Right atrial size was normal in size. Pericardium: There is no evidence of pericardial effusion. Mitral Valve: The mitral valve is normal in structure. Trivial mitral valve regurgitation. No evidence of mitral valve stenosis. Tricuspid Valve: The tricuspid valve is normal in structure. Tricuspid valve regurgitation is not demonstrated. No evidence of tricuspid stenosis. Aortic Valve: The aortic valve is tricuspid. Aortic valve regurgitation is not visualized. No aortic stenosis is present. Pulmonic Valve: The pulmonic valve was normal in structure. Pulmonic valve regurgitation is trivial. No evidence of pulmonic stenosis. Aorta: The aortic root is normal in size and structure. Venous: The inferior vena cava is normal in size with greater than 50% respiratory variability, suggesting right atrial pressure of 3 mmHg. IAS/Shunts: No atrial level shunt detected by color flow Doppler.  LEFT VENTRICLE PLAX 2D LVIDd:         3.40 cm   Diastology LVIDs:         1.80 cm   LV e' medial:    4.79 cm/s LV PW:         1.10 cm   LV E/e' medial:  22.3 LV IVS:        1.20 cm   LV e' lateral:   6.64 cm/s LVOT diam:     1.70 cm   LV E/e' lateral: 16.1 LV SV:         67 LV SV Index:   39 LVOT Area:     2.27 cm  RIGHT VENTRICLE RV S prime:     17.40 cm/s TAPSE (M-mode): 2.3 cm LEFT ATRIUM             Index        RIGHT ATRIUM           Index LA diam:        3.20 cm 1.89 cm/m   RA Area:     14.20 cm LA Vol (A2C):   66.3 ml 39.06 ml/m  RA Volume:   32.50 ml  19.15 ml/m LA Vol (A4C):   72.4 ml 42.65 ml/m LA Biplane Vol: 69.1 ml 40.71 ml/m  AORTIC VALVE LVOT Vmax:   160.00 cm/s LVOT Vmean:  120.000 cm/s LVOT VTI:    0.295 m  AORTA Ao Root diam: 3.00 cm MITRAL VALVE MV Area (PHT): 2.48 cm     SHUNTS MV Decel Time: 306 msec     Systemic VTI:  0.30 m MV E velocity: 107.00 cm/s  Systemic Diam: 1.70 cm MV A velocity: 143.00 cm/s MV E/A ratio:  0.75 Vishnu Priya Mallipeddi Electronically signed by Lucetta Russel  Mallipeddi Signature Date/Time: 03/19/2023/1:05:47 PM    Final    US  EKG SITE RITE Result Date: 03/19/2023 If Site Rite image not attached, placement could not be confirmed due to current cardiac  rhythm.   Anti-infectives: Anti-infectives (From admission, onward)    Start     Dose/Rate Route Frequency Ordered Stop   03/19/23 0700  cefoTEtan  (CEFOTAN ) 2 g in sodium chloride  0.9 % 100 mL IVPB        2 g 200 mL/hr over 30 Minutes Intravenous On call to O.R. 03/18/23 1104 03/20/23 0559   03/18/23 1200  cefoTEtan  (CEFOTAN ) 2 g in sodium chloride  0.9 % 100 mL IVPB  Status:  Discontinued        2 g 200 mL/hr over 30 Minutes Intravenous On call to O.R. 03/18/23 1104 03/18/23 1104       Assessment/Plan: Patient with a small bowel mass and obstruction. Needs surgery but is now COVID positive. Holding for now. PICC TPN reccommended for nutrition for now Sips and ice ok  Post for Friday pending her status with fevers and respiratory issues    LOS: 3 days    Awilda Bogus 03/19/2023

## 2023-03-19 NOTE — Progress Notes (Signed)
*  PRELIMINARY RESULTS* Echocardiogram 2D Echocardiogram has been performed.  Bernis Brisker 03/19/2023, 9:36 AM

## 2023-03-19 NOTE — Progress Notes (Signed)
   03/19/23 0848  Vitals  Temp (!) 102.7 F (39.3 C)  Temp Source Rectal  BP 129/61  MAP (mmHg) 80  BP Location Right Arm  BP Method Automatic  Patient Position (if appropriate) Lying  Pulse Rate (!) 102  Pulse Rate Source Dinamap  MEWS COLOR  MEWS Score Color Red  MEWS Score  MEWS Temp 2  MEWS Systolic 0  MEWS Pulse 1  MEWS RR 1  MEWS LOC 0  MEWS Score 4  Provider Notification  Provider Name/Title Dr Mason Sole  Date Provider Notified 03/19/23  Time Provider Notified 0848  Method of Notification Page  Notification Reason Critical Result (temp 102.7,resp 47)  Test performed and critical result vital signs  Date Critical Result Received 03/19/23  Time Critical Result Received 0848  Provider response See new orders  Date of Provider Response 03/19/23  Time of Provider Response [phone]

## 2023-03-19 NOTE — Progress Notes (Addendum)
 PHARMACY - TOTAL PARENTERAL NUTRITION CONSULT NOTE   Indication: Small bowel obstruction  Patient Measurements: Height: 5\' 6"  (167.6 cm) Weight: 61.7 kg (136 lb) IBW/kg (Calculated) : 59.3 TPN AdjBW (KG): 61.7 Body mass index is 21.95 kg/m.  Assessment:   Glucose / Insulin : 94-114 Electrolytes: mag 1.6- others WNL Renal: Scr 0.65 Hepatic: WNL   Albumin  3.0 Intake / Output; MIVF: D5NS w/ 4 mEq/L Kcl ended 2/9  GI Imaging/procedures: SBR and partial colectomy    Central access: pending- orders placed for PICC TPN start date: 2/10  Nutritional Goals: Pending goals per dietician   RD Assessment: pending  Current Nutrition:  NPO  Plan:  Start clinimix TPN at 40mL/hr at 1800 + lipids over 12 hours Electrolytes included at set amounts  Add standard MVI and trace elements to TPN Initiate Sensitive q6h SSI and adjust as needed  Monitor TPN labs on Mon/Thurs,   Cliffton Dama, PharmD Clinical Pharmacist 03/19/2023 11:25 AM

## 2023-03-19 NOTE — Progress Notes (Addendum)
 Nurse at bedside.Patient just had a 2D ECHO. Tylenol  650 mg suppository given per,Dr Shah's orders and per MAR prn.Plan of care on going.

## 2023-03-20 DIAGNOSIS — K56609 Unspecified intestinal obstruction, unspecified as to partial versus complete obstruction: Secondary | ICD-10-CM | POA: Diagnosis not present

## 2023-03-20 DIAGNOSIS — K6389 Other specified diseases of intestine: Secondary | ICD-10-CM | POA: Diagnosis not present

## 2023-03-20 LAB — URINALYSIS, ROUTINE W REFLEX MICROSCOPIC
Bilirubin Urine: NEGATIVE
Glucose, UA: NEGATIVE mg/dL
Hgb urine dipstick: NEGATIVE
Ketones, ur: 5 mg/dL — AB
Nitrite: NEGATIVE
Protein, ur: NEGATIVE mg/dL
Specific Gravity, Urine: 1.016 (ref 1.005–1.030)
WBC, UA: >50 WBC/hpf (ref 0–5)
pH: 5 (ref 5.0–8.0)

## 2023-03-20 LAB — BASIC METABOLIC PANEL
Anion gap: 6 (ref 5–15)
BUN: 9 mg/dL (ref 8–23)
CO2: 22 mmol/L (ref 22–32)
Calcium: 8 mg/dL — ABNORMAL LOW (ref 8.9–10.3)
Chloride: 106 mmol/L (ref 98–111)
Creatinine, Ser: 0.77 mg/dL (ref 0.44–1.00)
GFR, Estimated: >60 mL/min (ref >60)
Glucose, Bld: 113 mg/dL — ABNORMAL HIGH (ref 70–99)
Potassium: 3.3 mmol/L — ABNORMAL LOW (ref 3.5–5.1)
Sodium: 134 mmol/L — ABNORMAL LOW (ref 135–145)

## 2023-03-20 LAB — PHOSPHORUS: Phosphorus: 1.9 mg/dL — ABNORMAL LOW (ref 2.5–4.6)

## 2023-03-20 LAB — GLUCOSE, CAPILLARY
Glucose-Capillary: 110 mg/dL — ABNORMAL HIGH (ref 70–99)
Glucose-Capillary: 113 mg/dL — ABNORMAL HIGH (ref 70–99)
Glucose-Capillary: 142 mg/dL — ABNORMAL HIGH (ref 70–99)
Glucose-Capillary: 118 mg/dL — ABNORMAL HIGH (ref 70–99)

## 2023-03-20 LAB — TRIGLYCERIDES: Triglycerides: 88 mg/dL (ref <150)

## 2023-03-20 LAB — CBC
HCT: 32.9 % — ABNORMAL LOW (ref 36.0–46.0)
Hemoglobin: 10.5 g/dL — ABNORMAL LOW (ref 12.0–15.0)
MCH: 29.2 pg (ref 26.0–34.0)
MCHC: 31.9 g/dL (ref 30.0–36.0)
MCV: 91.6 fL (ref 80.0–100.0)
Platelets: 165 10*3/uL (ref 150–400)
RBC: 3.59 MIL/uL — ABNORMAL LOW (ref 3.87–5.11)
RDW: 13.3 % (ref 11.5–15.5)
WBC: 4.4 10*3/uL (ref 4.0–10.5)
nRBC: 0 % (ref 0.0–0.2)

## 2023-03-20 LAB — MAGNESIUM: Magnesium: 2.1 mg/dL (ref 1.7–2.4)

## 2023-03-20 MED ORDER — POTASSIUM CHLORIDE 10 MEQ/100ML IV SOLN
10.0000 meq | Freq: Once | INTRAVENOUS | Status: AC
Start: 2023-03-20 — End: 2023-03-20
  Administered 2023-03-20: 10 meq via INTRAVENOUS
  Filled 2023-03-20: qty 100

## 2023-03-20 MED ORDER — FAT EMUL FISH OIL/PLANT BASED 20% (SMOFLIPID)IV EMUL
250.0000 mL | INTRAVENOUS | Status: AC
  Administered 2023-03-20: 250 mL via INTRAVENOUS
  Filled 2023-03-20: qty 250

## 2023-03-20 MED ORDER — POTASSIUM PHOSPHATES 15 MMOLE/5ML IV SOLN
15.0000 mmol | Freq: Once | INTRAVENOUS | Status: AC
  Administered 2023-03-20: 15 mmol via INTRAVENOUS
  Filled 2023-03-20: qty 5

## 2023-03-20 MED ORDER — POTASSIUM CHLORIDE 10 MEQ/100ML IV SOLN
10.0000 meq | INTRAVENOUS | Status: AC
Start: 2023-03-20 — End: 2023-03-20
  Administered 2023-03-20 (×3): 10 meq via INTRAVENOUS
  Filled 2023-03-20 (×3): qty 100

## 2023-03-20 MED ORDER — INFUVITE ADULT IV SOLN
INTRAVENOUS | Status: AC
  Filled 2023-03-20: qty 1560

## 2023-03-20 NOTE — Progress Notes (Addendum)
Initial Nutrition Assessment  DOCUMENTATION CODES:   Not applicable  INTERVENTION:   TPN dosing per Pharmacy to meet 100% of nutrition needs as able. Monitor magnesium, potassium, and phosphorus levels, MD to replete as needed, as pt is at risk for refeeding syndrome given NPO since admission with low phos and K. Add Thiamine 100 mg daily for 7 days.  NUTRITION DIAGNOSIS:   Inadequate oral intake related to inability to eat as evidenced by NPO status.  GOAL:   Patient will meet greater than or equal to 90% of their needs  MONITOR:   I & O's, Diet advancement  REASON FOR ASSESSMENT:   Consult New TPN/TNA  ASSESSMENT:   89 yo female admitted with SBO, small bowel mass. Tested positive for COVID-19 on 2/10. PMH includes HTN, reflux, hiatal hernia, anxiety, arthritis.  PICC placed and TPN initiated on 2/10. Currently receiving Clinimix E 8/10 at 40 ml/h with SMOFlipid at 21 ml/h x 12 hours. This provides 1133 kcal and 77 gm protein daily, meeting 78% of minimum kcal needs and 100% of minimum protein needs.   Patient has been NPO since admission x 4 days. Unable to speak with patient, receiving nursing care at this time. She is at increased nutrition risk, given inability to take POs d/t SBO and ongoing NPO status. Plans for small bowel resection ? 2/14.   Labs reviewed. Na 134, K 3.3, phos 1.9 CBG: 115-110  Low phos and K could be related to refeeding syndrome.   Medications reviewed and include novolog, IV potassium chloride, IV potassium phosphate, remdesivir.   Weight history reviewed. Patient with 5% weight loss within the past 2 months, which is concerning, but not significant for the time frame.   NUTRITION - FOCUSED PHYSICAL EXAM:  Unable to complete  Diet Order:   Diet Order             Diet NPO time specified  Diet effective midnight                   EDUCATION NEEDS:   No education needs have been identified at this time  Skin:  Skin  Assessment: Reviewed RN Assessment  Last BM:  2/8 type 7  Height:   Ht Readings from Last 1 Encounters:  03/16/23 5\' 6"  (1.676 m)    Weight:   Wt Readings from Last 1 Encounters:  03/20/23 58.5 kg    Ideal Body Weight:  59.1 kg  BMI:  Body mass index is 20.82 kg/m.  Estimated Nutritional Needs:   Kcal:  1450-1650  Protein:  75-85 gm  Fluid:  >/= 1.5 L   Gabriel Rainwater RD, LDN, CNSC Contact via secure chat. If unavailable, use group chat "RD Inpatient."

## 2023-03-20 NOTE — Progress Notes (Signed)
Mobility Specialist Progress Note:    03/20/23 1005  Mobility  Activity Ambulated with assistance to bathroom  Level of Assistance Contact guard assist, steadying assist  Assistive Device Front wheel walker  Distance Ambulated (ft) 20 ft  Range of Motion/Exercises Active;All extremities  Activity Response Tolerated well  Mobility Referral Yes  Mobility visit 1 Mobility  Mobility Specialist Start Time (ACUTE ONLY) 1005  Mobility Specialist Stop Time (ACUTE ONLY) 1025  Mobility Specialist Time Calculation (min) (ACUTE ONLY) 20 min   Pt received in bed, agreeable to mobility. Required CGA to stand and ambulate with RW. Tolerated well, asx throughout. Returned pt supine, alarm on. All needs met.  Milliani Herrada Mobility Specialist Please contact via Special educational needs teacher or  Rehab office at [phone]

## 2023-03-20 NOTE — Progress Notes (Signed)
Patient continues as yellow MEWS, vital signs T-99.2, R-42, BP-127/70, P-83, O2-97% at room air. Patient verbalized complaints of shortness of breath. RT assessed patient. MD Thomes Dinning made aware.

## 2023-03-20 NOTE — Progress Notes (Signed)
Nurse at bedside,patient has no c/o pain or discomfort noted. Plan of care on going.

## 2023-03-20 NOTE — Progress Notes (Signed)
PHARMACY - TOTAL PARENTERAL NUTRITION CONSULT NOTE   Indication: Small bowel obstruction  Patient Measurements: Height: 5\' 6"  (167.6 cm) Weight: 58.5 kg (128 lb 15.5 oz) IBW/kg (Calculated) : 59.3 TPN AdjBW (KG): 61.7 Body mass index is 20.82 kg/m.  Assessment: Patient with a small bowel mass and obstruction. Needs surgery but is now COVID positive. TPN reccommended for nutrition for now  with SBO.   Glucose / Insulin: 94-115 Electrolytes: K 4> 3.3 mag 1.6> 2.1 Phos 1.9  Renal: Scr 0.65 Hepatic: WNL   Albumin 3.0 Intake / Output; MIVF: D5NS w/ 40 mEq/L Kcl ended 2/9  GI Imaging/procedures: SBR and partial colectomy    Central access: PICC line 03/19/23 placed TPN start date: 2/10  Nutritional Goals: Pending goals per dietician   RD Assessment: pending  Current Nutrition: Clinimix 8/10 at 56mls/hr provides=> 125gm(499kcal) Protein, 156gm(530Kcal) CHO, and 500kcal Lipids. Total calorie 1529kcal/day NPO  Plan:  Increase clinimix TPN to 11mL/hr at 1800 + lipids over 12 hours Electrolytes included at set amounts  Add standard MVI and trace elements to TPN Initiate Sensitive q6h SSI and adjust as needed  MD ordered KCL 37meq/100ml x 4 KPHos x 1 today Monitor TPN labs on Mon/Thurs, and as needed  Elder Cyphers, BS Pharm D, BCPS Clinical Pharmacist 03/20/2023 8:19 AM

## 2023-03-20 NOTE — Progress Notes (Signed)
[facility] Progress Note  * Surgery Date in Future *  Subjective: Doing ok. No major complaints. Fever curve improved. On covid treatment. TPN started.   Objective: Vital signs in last 24 hours: Temp:  [98.5 F (36.9 C)-99.9 F (37.7 C)] 99.3 F (37.4 C) (02/11 1338) Pulse Rate:  [83-101] 96 (02/11 1338) Resp:  [20-42] 24 (02/11 1338) BP: (126-168)/(61-83) 155/78 (02/11 1338) SpO2:  [96 %-100 %] 98 % (02/11 1338) Weight:  [58.5 kg] 58.5 kg (02/11 0401) Last BM Date : 03/17/23  Intake/Output from previous day: 02/10 0701 - 02/11 0700 In: 540.2 [I.V.:340.2; IV Piggyback:200] Out: 200 [Urine:200] Intake/Output this shift: Total I/O In: -  Out: 250 [Urine:250]  General appearance: alert and no distress GI: soft mildly distended ,nontender   Lab Results:  Recent Labs    03/19/23 0555 03/20/23 0346  WBC 5.7 4.4  HGB 10.9* 10.5*  HCT 32.9* 32.9*  PLT 172 165   BMET Recent Labs    03/19/23 0233 03/20/23 0346  NA 137 134*  K 4.0 3.3*  CL 108 106  CO2 21* 22  GLUCOSE 94 113*  BUN <5* 9  CREATININE 0.65 0.77  CALCIUM 8.4* 8.0*   PT/INR No results for input(s): "LABPROT", "INR" in the last 72 hours.  Studies/Results: DG Chest 1 View Result Date: 03/19/2023 CLINICAL DATA:  PICC line placement EXAM: CHEST  1 VIEW COMPARISON:  03/19/2023 FINDINGS: Single frontal view of the chest demonstrates right-sided PICC tip overlying superior vena cava. The cardiac silhouette is stable. Hiatal hernia again noted. Stable small left pleural effusion, with patchy left basilar consolidation. Right chest is clear. No pneumothorax. IMPRESSION: 1. Right-sided PICC line, tip overlying superior vena cava. 2. Small left pleural effusion, with patchy left basilar consolidation likely atelectasis. 3. Stable hiatal hernia. Electronically Signed   By: Sharlet Salina M.D.   On: 03/19/2023 22:43   DG CHEST PORT 1 VIEW Result Date: 03/19/2023 CLINICAL DATA:  Fever EXAM:  PORTABLE CHEST 1 VIEW COMPARISON:  05/18/20 CXR. FINDINGS: Small left pleural effusion. No consolidation or pneumothorax or edema. Degenerative changes of the spine. Normal cardiopericardial shadow is preserved. IMPRESSION: Small left effusion. Electronically Signed   By: Karen Kays M.D.   On: 03/19/2023 14:19   ECHOCARDIOGRAM COMPLETE Result Date: 03/19/2023    ECHOCARDIOGRAM REPORT   Patient Name:   Dawn Estrada Date of Exam: 03/19/2023 Medical Rec #:  [id number]         Height:       66.0 in Accession #:    [id number]        Weight:       136.0 lb Date of Birth:  [birth year]         BSA:          1.697 m Patient Age:    89 years         BP:           131/61 mmHg Patient Gender: F                 HR:           92 bpm. [facility] Procedure: 2D Echo, Cardiac Doppler and Color Doppler Indications:    Pre-OP clearance  History:        Patient has prior history of Echocardiogram examinations, most                 recent 11/26/2014. Risk Factors:Hypertension. GERD.  Sonographer:  Celesta Gentile RCS Referring Phys: [id number] Lamont Dowdy [facility] IMPRESSIONS  1. Left ventricular intracavitary gradient of 30 mm Hg ia noted at rest likely secondary to hyperdynamic left ventricle. Left ventricular ejection fraction, by estimation, is >75%. The left ventricle has hyperdynamic function. The left ventricle has no regional wall motion abnormalities. There is mild left ventricular hypertrophy. Left ventricular diastolic parameters are consistent with Grade I diastolic dysfunction (impaired relaxation). Elevated left ventricular end-diastolic pressure.  2. Right ventricular systolic function is normal. The right ventricular size is normal. Tricuspid regurgitation signal is inadequate for assessing PA pressure.  3. Left atrial size was moderately dilated.  4. The mitral valve is normal in structure. Trivial mitral valve regurgitation. No evidence of mitral stenosis.  5. The aortic valve is tricuspid. Aortic valve  regurgitation is not visualized. No aortic stenosis is present.  6. The inferior vena cava is normal in size with greater than 50% respiratory variability, suggesting right atrial pressure of 3 mmHg.  7. Increased flow velocities may be secondary to anemia, thyrotoxicosis, hyperdynamic or high flow state. Comparison(s): No prior Echocardiogram. FINDINGS  Left Ventricle: Left ventricular ejection fraction, by estimation, is >75%. The left ventricle has hyperdynamic function. The left ventricle has no regional wall motion abnormalities. The left ventricular internal cavity size was normal in size. There is mild left ventricular hypertrophy. Left ventricular diastolic parameters are consistent with Grade I diastolic dysfunction (impaired relaxation). Elevated left ventricular end-diastolic pressure. Right Ventricle: The right ventricular size is normal. No increase in right ventricular wall thickness. Right ventricular systolic function is normal. Tricuspid regurgitation signal is inadequate for assessing PA pressure. Left Atrium: Left atrial size was moderately dilated. Right Atrium: Right atrial size was normal in size. Pericardium: There is no evidence of pericardial effusion. Mitral Valve: The mitral valve is normal in structure. Trivial mitral valve regurgitation. No evidence of mitral valve stenosis. Tricuspid Valve: The tricuspid valve is normal in structure. Tricuspid valve regurgitation is not demonstrated. No evidence of tricuspid stenosis. Aortic Valve: The aortic valve is tricuspid. Aortic valve regurgitation is not visualized. No aortic stenosis is present. Pulmonic Valve: The pulmonic valve was normal in structure. Pulmonic valve regurgitation is trivial. No evidence of pulmonic stenosis. Aorta: The aortic root is normal in size and structure. Venous: The inferior vena cava is normal in size with greater than 50% respiratory variability, suggesting right atrial pressure of 3 mmHg. IAS/Shunts: No atrial  level shunt detected by color flow Doppler.  LEFT VENTRICLE PLAX 2D LVIDd:         3.40 cm   Diastology LVIDs:         1.80 cm   LV e' medial:    4.79 cm/s LV PW:         1.10 cm   LV E/e' medial:  22.3 LV IVS:        1.20 cm   LV e' lateral:   6.64 cm/s LVOT diam:     1.70 cm   LV E/e' lateral: 16.1 LV SV:         67 LV SV Index:   39 LVOT Area:     2.27 cm  RIGHT VENTRICLE RV S prime:     17.40 cm/s TAPSE (M-mode): 2.3 cm LEFT ATRIUM             Index        RIGHT ATRIUM           Index LA diam:  3.20 cm 1.89 cm/m   RA Area:     14.20 cm LA Vol (A2C):   66.3 ml 39.06 ml/m  RA Volume:   32.50 ml  19.15 ml/m LA Vol (A4C):   72.4 ml 42.65 ml/m LA Biplane Vol: 69.1 ml 40.71 ml/m  AORTIC VALVE LVOT Vmax:   160.00 cm/s LVOT Vmean:  120.000 cm/s LVOT VTI:    0.295 m  AORTA Ao Root diam: 3.00 cm MITRAL VALVE MV Area (PHT): 2.48 cm     SHUNTS MV Decel Time: 306 msec     Systemic VTI:  0.30 m MV E velocity: 107.00 cm/s  Systemic Diam: 1.70 cm MV A velocity: 143.00 cm/s MV E/A ratio:  0.75 Vishnu Priya Mallipeddi Electronically signed by Winfield Rast Mallipeddi Signature Date/Time: 03/19/2023/1:05:47 PM    Final    [place] EKG SITE RITE Result Date: 03/19/2023 If Site Rite image not attached, placement could not be confirmed due to current cardiac rhythm.   Anti-infectives: Anti-infectives (From admission, onward)    Start     Dose/Rate Route Frequency Ordered Stop   03/20/23 1000  remdesivir 100 mg in sodium chloride 0.9 % 100 mL IVPB  Status:  Discontinued       Placed in "Followed by" Linked Group   100 mg 200 mL/hr over 30 Minutes Intravenous Daily 03/19/23 1324 03/19/23 1348   03/20/23 1000  remdesivir 100 mg in sodium chloride 0.9 % 100 mL IVPB       Placed in "Followed by" Linked Group   100 mg 200 mL/hr over 30 Minutes Intravenous Daily 03/19/23 1348 03/22/23 0959   03/19/23 1445  remdesivir 100 mg in sodium chloride 0.9 % 100 mL IVPB       Placed in "Followed by" Linked Group   100  mg 200 mL/hr over 30 Minutes Intravenous Every 30 min 03/19/23 1348 03/19/23 1701   03/19/23 1415  remdesivir 200 mg in sodium chloride 0.9% 250 mL IVPB  Status:  Discontinued       Placed in "Followed by" Linked Group   200 mg 580 mL/hr over 30 Minutes Intravenous Once 03/19/23 1324 03/19/23 1348   03/19/23 0700  cefoTEtan (CEFOTAN) 2 g in sodium chloride 0.9 % 100 mL IVPB        2 g 200 mL/hr over 30 Minutes Intravenous On call to O.R. 03/18/23 1104 03/20/23 0559   03/18/23 1200  cefoTEtan (CEFOTAN) 2 g in sodium chloride 0.9 % 100 mL IVPB  Status:  Discontinued        2 g 200 mL/hr over 30 Minutes Intravenous On call to O.R. 03/18/23 1104 03/18/23 1104       Assessment/Plan: Patient with small bowel mass and small bowel obstruction. Covid positive. Doing fair for now. Fevers better. Post poned any surgery for now Will want to make sure she is optimized before proceeding    LOS: 4 days    [name] 03/20/2023

## 2023-03-20 NOTE — Progress Notes (Signed)
Nurse at bedside,patient alert and oriented person place,confused to time,oriented ti situation. No c/o pain or discomfort noted at this time.Patient ambulating in room with front wheel walker times one assist,without difficulty.Plan of care on going.

## 2023-03-20 NOTE — Plan of Care (Signed)
Problem: Education: Goal: Knowledge of risk factors and measures for prevention of condition will improve Outcome: Progressing   Problem: Education: Goal: Knowledge of risk factors and measures for prevention of condition will improve Outcome: Progressing

## 2023-03-20 NOTE — Progress Notes (Signed)
PROGRESS NOTE    Dawn EMMERICH  Estrada:[id number] DOB: 11-Oct-1919 DOA: 03/16/2023 PCP: Kirstie Peri, MD   Brief Narrative:    Dawn Estrada is a 89 y.o. female with medical history significant of essential hypertension, hiatal hernia, hemorrhoids who presents to the emergency department due to several months onset of intermittent abdominal pain which worsened last night.  Patient was admitted for small bowel obstruction.  General surgery consulted with plans for small bowel resection and possible partial colectomy in a.m.  EKG and 2D echocardiogram ordered for preop evaluation pending.  She had fever and further evaluation reveals COVID infection and she has been started on remdesivir.  Fevers are improving and she has now had PICC line placement as well as initiation of TPN on 2/10 and is awaiting possible surgical intervention on 2/14.  Assessment & Plan:   Principal Problem:   Small bowel obstruction (HCC) Active Problems:   Hypokalemia   Essential hypertension   Gastroesophageal reflux disease   Hyponatremia   Hypoalbuminemia   Hiatal hernia   Hemorrhoids   Small bowel mass  Assessment and Plan:   Small bowel obstruction CT abdomen and pelvis with contrast showed small bowel obstruction with transition point in the distal ileum where there is an enhancing lesion measuring 1.9 x 0.9 cm. Findings are worrisome for neoplasm. Appreciate general surgery ongoing evaluation, plan for SBR on 2/14 if clinically stable EKG and 2D echocardiogram WNL with preserved EF PICC line and TPN placed for nutrition on 2/10  COVID infection with fever Started on remdesivir with improvements in fever noted No significant hypoxemia or findings on chest x-ray Continue ongoing treatment and airborne precautions   Hyponatremia mild/hypokalemia Na 129, no known cause at this time, though may be due to medication effect considering that patient is on HCTZ Continue to monitor while on  TPN Replete potassium with IV and recheck in a.m.   Hypoalbuminemia Albumin 3.3, consider protein supplement when patient resumes oral intake Currently on TPN for nutrition   Essential hypertension Continue IV hydralazine 10 mg every 6 hours as needed for SBP > 170 Consider oral meds when patient resumes oral intake   Hiatal hernia GERD Continue Protonix   Hemorrhoids Continue Preparation H   DVT prophylaxis: SCDs Code Status: Full Family Communication: Son at bedside 2/10 Disposition Plan:  Status is: Inpatient Remains inpatient appropriate because: Need for IV medication.  Consultants:  General surgery  Procedures:  2D echocardiogram 2/10 PICC line placement 2/10  Antimicrobials:  Anti-infectives (From admission, onward)    Start     Dose/Rate Route Frequency Ordered Stop   03/20/23 1000  remdesivir 100 mg in sodium chloride 0.9 % 100 mL IVPB  Status:  Discontinued       Placed in "Followed by" Linked Group   100 mg 200 mL/hr over 30 Minutes Intravenous Daily 03/19/23 1324 03/19/23 1348   03/20/23 1000  remdesivir 100 mg in sodium chloride 0.9 % 100 mL IVPB       Placed in "Followed by" Linked Group   100 mg 200 mL/hr over 30 Minutes Intravenous Daily 03/19/23 1348 03/22/23 0959   03/19/23 1445  remdesivir 100 mg in sodium chloride 0.9 % 100 mL IVPB       Placed in "Followed by" Linked Group   100 mg 200 mL/hr over 30 Minutes Intravenous Every 30 min 03/19/23 1348 03/19/23 1701   03/19/23 1415  remdesivir 200 mg in sodium chloride 0.9% 250 mL IVPB  Status:  Discontinued  Placed in "Followed by" Linked Group   200 mg 580 mL/hr over 30 Minutes Intravenous Once 03/19/23 1324 03/19/23 1348   03/19/23 0700  cefoTEtan (CEFOTAN) 2 g in sodium chloride 0.9 % 100 mL IVPB        2 g 200 mL/hr over 30 Minutes Intravenous On call to O.R. 03/18/23 1104 03/20/23 0559   03/18/23 1200  cefoTEtan (CEFOTAN) 2 g in sodium chloride 0.9 % 100 mL IVPB  Status:  Discontinued         2 g 200 mL/hr over 30 Minutes Intravenous On call to O.R. 03/18/23 1104 03/18/23 1104      Subjective: Patient seen and evaluated today with no new acute complaints or concerns.  She appears comfortable this morning and has been started on TPN after PICC line placement yesterday.  No significant fevers noted this morning.  Objective: Vitals:   03/19/23 2208 03/20/23 0247 03/20/23 0401 03/20/23 0547  BP: 126/63 127/70  139/63  Pulse: 92 83  89  Resp: (!) 38 (!) 42  (!) 32  Temp:  99.2 F (37.3 C)  98.5 F (36.9 C)  TempSrc:  Oral    SpO2: 96% 97%  100%  Weight:   58.5 kg   Height:        Intake/Output Summary (Last 24 hours) at 03/20/2023 0841 Last data filed at 03/20/2023 0500 Gross per 24 hour  Intake 540.19 ml  Output 200 ml  Net 340.19 ml   Filed Weights   03/16/23 1427 03/20/23 0401  Weight: 61.7 kg 58.5 kg    Examination:  General exam: Appears calm and comfortable  Respiratory system: Clear to auscultation. Respiratory effort normal. Cardiovascular system: S1 & S2 heard, RRR.  Gastrointestinal system: Abdomen is soft Central nervous system: Alert and awake Extremities: No edema, right upper extremity with PICC line Skin: No significant lesions noted Psychiatry: Flat affect.    Data Reviewed: I have personally reviewed following labs and imaging studies  CBC: Recent Labs  Lab 03/16/23 1448 03/17/23 0321 03/19/23 0555 03/20/23 0346  WBC 7.5 7.6 5.7 4.4  HGB 12.6 12.0 10.9* 10.5*  HCT 38.2 36.7 32.9* 32.9*  MCV 90.7 90.0 91.1 91.6  PLT 202 195 172 165   Basic Metabolic Panel: Recent Labs  Lab 03/16/23 1448 03/17/23 0321 03/18/23 0423 03/19/23 0233 03/20/23 0346  NA 129* 131* 135 137 134*  K 3.4* 3.4* 3.4* 4.0 3.3*  CL 96* 99 105 108 106  CO2 23 22 23  21* 22  GLUCOSE 149* 100* 114* 94 113*  BUN 12 12 7* <5* 9  CREATININE 0.94 0.77 0.79 0.65 0.77  CALCIUM 8.4* 8.3* 8.1* 8.4* 8.0*  MG  --  1.5* 1.8 1.6* 2.1  PHOS  --  2.6  --   --   1.9*   GFR: Estimated Creatinine Clearance: 32.8 mL/min (by C-G formula based on SCr of 0.77 mg/dL). Liver Function Tests: Recent Labs  Lab 03/16/23 1448 03/17/23 0321 03/19/23 0233  AST 29 21 20   ALT 16 15 12   ALKPHOS 53 47 46  BILITOT 1.5* 1.6* 1.1  PROT 7.0 6.5 6.1*  ALBUMIN 3.3* 3.0* 3.0*   Recent Labs  Lab 03/16/23 1448  LIPASE 29   No results for input(s): "AMMONIA" in the last 168 hours. Coagulation Profile: No results for input(s): "INR", "PROTIME" in the last 168 hours. Cardiac Enzymes: No results for input(s): "CKTOTAL", "CKMB", "CKMBINDEX", "TROPONINI" in the last 168 hours. BNP (last 3 results) No results for input(s): "PROBNP"  in the last 8760 hours. HbA1C: No results for input(s): "HGBA1C" in the last 72 hours. CBG: Recent Labs  Lab 03/19/23 2357 03/20/23 0546  GLUCAP 115* 110*   Lipid Profile: Recent Labs    03/20/23 0346  TRIG 88   Thyroid Function Tests: No results for input(s): "TSH", "T4TOTAL", "FREET4", "T3FREE", "THYROIDAB" in the last 72 hours. Anemia Panel: No results for input(s): "VITAMINB12", "FOLATE", "FERRITIN", "TIBC", "IRON", "RETICCTPCT" in the last 72 hours. Sepsis Labs: Recent Labs  Lab 03/19/23 7829 03/19/23 1128  PROCALCITON <0.10  --   LATICACIDVEN  --  0.7    Recent Results (from the past 240 hours)  Resp panel by RT-PCR (RSV, Flu A&B, Covid) Anterior Nasal Swab     Status: Abnormal   Collection Time: 03/19/23  9:30 AM   Specimen: Anterior Nasal Swab  Result Value Ref Range Status   SARS Coronavirus 2 by RT PCR POSITIVE (A) NEGATIVE Final    Comment: (NOTE) SARS-CoV-2 target nucleic acids are DETECTED.  The SARS-CoV-2 RNA is generally detectable in upper respiratory specimens during the acute phase of infection. Positive results are indicative of the presence of the identified virus, but do not rule out bacterial infection or co-infection with other pathogens not detected by the test. Clinical correlation with  patient history and other diagnostic information is necessary to determine patient infection status. The expected result is Negative.  Fact Sheet for Patients: BloggerCourse.com  Fact Sheet for Healthcare Providers: SeriousBroker.it  This test is not yet approved or cleared by the [place] FDA and  has been authorized for detection and/or diagnosis of SARS-CoV-2 by FDA under an Emergency Use Authorization (EUA).  This EUA will remain in effect (meaning this test can be used) for the duration of  the COVID-19 declaration under Section 564(b)(1) of the A ct, 21 U.S.C. section 360bbb-3(b)(1), unless the authorization is terminated or revoked sooner.     Influenza A by PCR NEGATIVE NEGATIVE Final   Influenza B by PCR NEGATIVE NEGATIVE Final    Comment: (NOTE) The Xpert Xpress SARS-CoV-2/FLU/RSV plus assay is intended as an aid in the diagnosis of influenza from Nasopharyngeal swab specimens and should not be used as a sole basis for treatment. Nasal washings and aspirates are unacceptable for Xpert Xpress SARS-CoV-2/FLU/RSV testing.  Fact Sheet for Patients: BloggerCourse.com  Fact Sheet for Healthcare Providers: SeriousBroker.it  This test is not yet approved or cleared by the [place] FDA and has been authorized for detection and/or diagnosis of SARS-CoV-2 by FDA under an Emergency Use Authorization (EUA). This EUA will remain in effect (meaning this test can be used) for the duration of the COVID-19 declaration under Section 564(b)(1) of the Act, 21 U.S.C. section 360bbb-3(b)(1), unless the authorization is terminated or revoked.     Resp Syncytial Virus by PCR NEGATIVE NEGATIVE Final    Comment: (NOTE) Fact Sheet for Patients: BloggerCourse.com  Fact Sheet for Healthcare Providers: SeriousBroker.it  This test is  not yet approved or cleared by the [place] FDA and has been authorized for detection and/or diagnosis of SARS-CoV-2 by FDA under an Emergency Use Authorization (EUA). This EUA will remain in effect (meaning this test can be used) for the duration of the COVID-19 declaration under Section 564(b)(1) of the Act, 21 U.S.C. section 360bbb-3(b)(1), unless the authorization is terminated or revoked.  Performed at [facility], [address] [place], [place] [address]   Culture, blood (Routine X 2) w Reflex to ID Panel     Status:  None (Preliminary result)   Collection Time: 03/19/23  9:47 AM   Specimen: BLOOD  Result Value Ref Range Status   Specimen Description BLOOD BLOOD LEFT FOREARM  Final   Special Requests   Final    BOTTLES DRAWN AEROBIC ONLY Blood Culture adequate volume   Culture   Final    NO GROWTH < 24 HOURS Performed at [facility], [address], [place], [place] [address]    Report Status PENDING  Incomplete  Culture, blood (Routine X 2) w Reflex to ID Panel     Status: None (Preliminary result)   Collection Time: 03/19/23  9:52 AM   Specimen: BLOOD  Result Value Ref Range Status   Specimen Description BLOOD BLOOD LEFT HAND  Final   Special Requests   Final    BOTTLES DRAWN AEROBIC ONLY Blood Culture adequate volume   Culture   Final    NO GROWTH < 24 HOURS Performed at [facility], [address], [place], [place] [address]    Report Status PENDING  Incomplete         Radiology Studies: DG Chest 1 View Result Date: 03/19/2023 CLINICAL DATA:  PICC line placement EXAM: CHEST  1 VIEW COMPARISON:  03/19/2023 FINDINGS: Single frontal view of the chest demonstrates right-sided PICC tip overlying superior vena cava. The cardiac silhouette is stable. Hiatal hernia again noted. Stable small left pleural effusion, with patchy left basilar consolidation. Right chest is clear. No pneumothorax. IMPRESSION: 1. Right-sided PICC line, tip overlying superior vena cava. 2.  Small left pleural effusion, with patchy left basilar consolidation likely atelectasis. 3. Stable hiatal hernia. Electronically Signed   By: Sharlet Salina M.D.   On: 03/19/2023 22:43   DG CHEST PORT 1 VIEW Result Date: 03/19/2023 CLINICAL DATA:  Fever EXAM: PORTABLE CHEST 1 VIEW COMPARISON:  05/18/20 CXR. FINDINGS: Small left pleural effusion. No consolidation or pneumothorax or edema. Degenerative changes of the spine. Normal cardiopericardial shadow is preserved. IMPRESSION: Small left effusion. Electronically Signed   By: Karen Kays M.D.   On: 03/19/2023 14:19   ECHOCARDIOGRAM COMPLETE Result Date: 03/19/2023    ECHOCARDIOGRAM REPORT   Patient Name:   REECE MCBROOM Date of Exam: 03/19/2023 Medical Rec #:  [id number]         Height:       66.0 in Accession #:    [id number]        Weight:       136.0 lb Date of Birth:  [birth year]         BSA:          1.697 m Patient Age:    89 years         BP:           131/61 mmHg Patient Gender: F                 HR:           92 bpm. [facility] Procedure: 2D Echo, Cardiac Doppler and Color Doppler Indications:    Pre-OP clearance  History:        Patient has prior history of Echocardiogram examinations, most                 recent 11/26/2014. Risk Factors:Hypertension. GERD.  Sonographer:    Celesta Gentile RCS Referring Phys: [id number] Danna Sewell D [facility] IMPRESSIONS  1. Left ventricular intracavitary gradient of 30 mm Hg ia noted at rest likely secondary to hyperdynamic left ventricle.  Left ventricular ejection fraction, by estimation, is >75%. The left ventricle has hyperdynamic function. The left ventricle has no regional wall motion abnormalities. There is mild left ventricular hypertrophy. Left ventricular diastolic parameters are consistent with Grade I diastolic dysfunction (impaired relaxation). Elevated left ventricular end-diastolic pressure.  2. Right ventricular systolic function is normal. The right ventricular size is normal. Tricuspid  regurgitation signal is inadequate for assessing PA pressure.  3. Left atrial size was moderately dilated.  4. The mitral valve is normal in structure. Trivial mitral valve regurgitation. No evidence of mitral stenosis.  5. The aortic valve is tricuspid. Aortic valve regurgitation is not visualized. No aortic stenosis is present.  6. The inferior vena cava is normal in size with greater than 50% respiratory variability, suggesting right atrial pressure of 3 mmHg.  7. Increased flow velocities may be secondary to anemia, thyrotoxicosis, hyperdynamic or high flow state. Comparison(s): No prior Echocardiogram. FINDINGS  Left Ventricle: Left ventricular ejection fraction, by estimation, is >75%. The left ventricle has hyperdynamic function. The left ventricle has no regional wall motion abnormalities. The left ventricular internal cavity size was normal in size. There is mild left ventricular hypertrophy. Left ventricular diastolic parameters are consistent with Grade I diastolic dysfunction (impaired relaxation). Elevated left ventricular end-diastolic pressure. Right Ventricle: The right ventricular size is normal. No increase in right ventricular wall thickness. Right ventricular systolic function is normal. Tricuspid regurgitation signal is inadequate for assessing PA pressure. Left Atrium: Left atrial size was moderately dilated. Right Atrium: Right atrial size was normal in size. Pericardium: There is no evidence of pericardial effusion. Mitral Valve: The mitral valve is normal in structure. Trivial mitral valve regurgitation. No evidence of mitral valve stenosis. Tricuspid Valve: The tricuspid valve is normal in structure. Tricuspid valve regurgitation is not demonstrated. No evidence of tricuspid stenosis. Aortic Valve: The aortic valve is tricuspid. Aortic valve regurgitation is not visualized. No aortic stenosis is present. Pulmonic Valve: The pulmonic valve was normal in structure. Pulmonic valve  regurgitation is trivial. No evidence of pulmonic stenosis. Aorta: The aortic root is normal in size and structure. Venous: The inferior vena cava is normal in size with greater than 50% respiratory variability, suggesting right atrial pressure of 3 mmHg. IAS/Shunts: No atrial level shunt detected by color flow Doppler.  LEFT VENTRICLE PLAX 2D LVIDd:         3.40 cm   Diastology LVIDs:         1.80 cm   LV e' medial:    4.79 cm/s LV PW:         1.10 cm   LV E/e' medial:  22.3 LV IVS:        1.20 cm   LV e' lateral:   6.64 cm/s LVOT diam:     1.70 cm   LV E/e' lateral: 16.1 LV SV:         67 LV SV Index:   39 LVOT Area:     2.27 cm  RIGHT VENTRICLE RV S prime:     17.40 cm/s TAPSE (M-mode): 2.3 cm LEFT ATRIUM             Index        RIGHT ATRIUM           Index LA diam:        3.20 cm 1.89 cm/m   RA Area:     14.20 cm LA Vol (A2C):   66.3 ml 39.06 ml/m  RA Volume:   32.50  ml  19.15 ml/m LA Vol (A4C):   72.4 ml 42.65 ml/m LA Biplane Vol: 69.1 ml 40.71 ml/m  AORTIC VALVE LVOT Vmax:   160.00 cm/s LVOT Vmean:  120.000 cm/s LVOT VTI:    0.295 m  AORTA Ao Root diam: 3.00 cm MITRAL VALVE MV Area (PHT): 2.48 cm     SHUNTS MV Decel Time: 306 msec     Systemic VTI:  0.30 m MV E velocity: 107.00 cm/s  Systemic Diam: 1.70 cm MV A velocity: 143.00 cm/s MV E/A ratio:  0.75 Vishnu Priya Mallipeddi Electronically signed by Winfield Rast Mallipeddi Signature Date/Time: 03/19/2023/1:05:47 PM    Final    [place] EKG SITE RITE Result Date: 03/19/2023 If Site Rite image not attached, placement could not be confirmed due to current cardiac rhythm.       Scheduled Meds:  ALPRAZolam  0.5-1 mg Oral BID   Chlorhexidine Gluconate Cloth  6 each Topical Daily   hydrocortisone cream   Topical BID   insulin aspart  0-9 Units Subcutaneous Q6H   metoprolol tartrate  50 mg Oral BID   pantoprazole (PROTONIX) IV  40 mg Intravenous Q24H   sodium chloride flush  10-40 mL Intracatheter Q12H   Continuous Infusions:  TPN (CLINIMIX-E)  Adult     And   fat emul(SMOFlipid)     potassium chloride     remdesivir 100 mg in sodium chloride 0.9 % 100 mL IVPB     TPN (CLINIMIX-E) Adult 40 mL/hr at 03/20/23 0327     LOS: 4 days    Time spent: 55 minutes    Effa Yarrow D Sherryll Burger, DO [facility]  If 7PM-7AM, please contact night-coverage [email or web address] 03/20/2023, 8:41 AM

## 2023-03-20 NOTE — Progress Notes (Signed)
MD Adefeso at bedside, assessed patient. No new orders at this time.

## 2023-03-21 ENCOUNTER — Inpatient Hospital Stay (HOSPITAL_COMMUNITY): Payer: Medicare Other

## 2023-03-21 ENCOUNTER — Encounter (HOSPITAL_COMMUNITY): Payer: Self-pay | Admitting: Radiology

## 2023-03-21 DIAGNOSIS — E871 Hypo-osmolality and hyponatremia: Secondary | ICD-10-CM | POA: Diagnosis not present

## 2023-03-21 DIAGNOSIS — K6389 Other specified diseases of intestine: Secondary | ICD-10-CM

## 2023-03-21 DIAGNOSIS — K219 Gastro-esophageal reflux disease without esophagitis: Secondary | ICD-10-CM

## 2023-03-21 DIAGNOSIS — K56609 Unspecified intestinal obstruction, unspecified as to partial versus complete obstruction: Secondary | ICD-10-CM | POA: Diagnosis not present

## 2023-03-21 LAB — CBC
HCT: 32.9 % — ABNORMAL LOW (ref 36.0–46.0)
Hemoglobin: 10.8 g/dL — ABNORMAL LOW (ref 12.0–15.0)
MCH: 30 pg (ref 26.0–34.0)
MCHC: 32.8 g/dL (ref 30.0–36.0)
MCV: 91.4 fL (ref 80.0–100.0)
Platelets: 147 10*3/uL — ABNORMAL LOW (ref 150–400)
RBC: 3.6 MIL/uL — ABNORMAL LOW (ref 3.87–5.11)
RDW: 13.3 % (ref 11.5–15.5)
WBC: 4 10*3/uL (ref 4.0–10.5)
nRBC: 0 % (ref 0.0–0.2)

## 2023-03-21 LAB — GLUCOSE, CAPILLARY
Glucose-Capillary: 140 mg/dL — ABNORMAL HIGH (ref 70–99)
Glucose-Capillary: 142 mg/dL — ABNORMAL HIGH (ref 70–99)
Glucose-Capillary: 144 mg/dL — ABNORMAL HIGH (ref 70–99)
Glucose-Capillary: 85 mg/dL (ref 70–99)

## 2023-03-21 LAB — BASIC METABOLIC PANEL
Anion gap: 8 (ref 5–15)
BUN: 14 mg/dL (ref 8–23)
CO2: 22 mmol/L (ref 22–32)
Calcium: 8 mg/dL — ABNORMAL LOW (ref 8.9–10.3)
Chloride: 106 mmol/L (ref 98–111)
Creatinine, Ser: 0.67 mg/dL (ref 0.44–1.00)
GFR, Estimated: >60 mL/min (ref >60)
Glucose, Bld: 116 mg/dL — ABNORMAL HIGH (ref 70–99)
Potassium: 3.4 mmol/L — ABNORMAL LOW (ref 3.5–5.1)
Sodium: 136 mmol/L (ref 135–145)

## 2023-03-21 LAB — PHOSPHORUS: Phosphorus: 2.3 mg/dL — ABNORMAL LOW (ref 2.5–4.6)

## 2023-03-21 LAB — MAGNESIUM: Magnesium: 1.8 mg/dL (ref 1.7–2.4)

## 2023-03-21 MED ORDER — POTASSIUM CHLORIDE 10 MEQ/100ML IV SOLN
10.0000 meq | INTRAVENOUS | Status: AC
  Administered 2023-03-21 (×2): 10 meq via INTRAVENOUS

## 2023-03-21 MED ORDER — POTASSIUM PHOSPHATES 15 MMOLE/5ML IV SOLN
10.0000 mmol | Freq: Once | INTRAVENOUS | Status: AC
  Administered 2023-03-21: 10 mmol via INTRAVENOUS
  Filled 2023-03-21: qty 3.33

## 2023-03-21 MED ORDER — IOHEXOL 300 MG/ML  SOLN
100.0000 mL | Freq: Once | INTRAMUSCULAR | Status: AC | PRN
Start: 2023-03-21 — End: 2023-03-21
  Administered 2023-03-21: 100 mL via INTRAVENOUS

## 2023-03-21 MED ORDER — TRAVASOL 10 % IV SOLN
INTRAVENOUS | Status: AC
  Filled 2023-03-21: qty 811.2

## 2023-03-21 NOTE — Progress Notes (Signed)
Has been ambulating to bathroom with assist for voiding and had medium bm.  O2 sat in upper 90's on room air.  Denies nausea and pain.  TPN infusing.

## 2023-03-21 NOTE — Plan of Care (Signed)
  Problem: Acute Rehab PT Goals(only PT should resolve) Goal: Pt Will Go Supine/Side To Sit Outcome: Progressing Flowsheets (Taken 03/21/2023 1526) Pt will go Supine/Side to Sit: with modified independence Goal: Patient Will Transfer Sit To/From Stand Outcome: Progressing Flowsheets (Taken 03/21/2023 1526) Patient will transfer sit to/from stand: with modified independence Goal: Pt Will Transfer Bed To Chair/Chair To Bed Outcome: Progressing Flowsheets (Taken 03/21/2023 1526) Pt will Transfer Bed to Chair/Chair to Bed: with modified independence Goal: Pt Will Ambulate Outcome: Progressing Flowsheets (Taken 03/21/2023 1526) Pt will Ambulate:  100 feet  with supervision  with modified independence  with rolling walker   3:27 PM, 03/21/23 Ocie Bob, MPT Physical Therapist with [facility] [phone] office [phone] mobile phone

## 2023-03-21 NOTE — Progress Notes (Signed)
PROGRESS NOTE   Dawn Estrada  GHW:[id number] DOB: 1919/07/16 DOA: 03/16/2023 PCP: Kirstie Peri, MD   Chief Complaint  Patient presents with   Emesis   Level of care: Med-Surg  Brief Admission History:   89 y.o. female with medical history significant of essential hypertension, hiatal hernia, hemorrhoids who presents to the emergency department due to several months onset of intermittent abdominal pain which worsened last night.  Patient was admitted for small bowel obstruction.  General surgery consulted with plans for small bowel resection and possible partial colectomy in a.m.  EKG and 2D echocardiogram ordered for preop evaluation pending.  She had fever and further evaluation reveals COVID infection and she has been started on remdesivir.  Fevers are improving and she has now had PICC line placement as well as initiation of TPN on 2/10 and is awaiting possible surgical intervention on 2/14.    Assessment and Plan:  Small bowel obstruction CT abdomen and pelvis with contrast showed small bowel obstruction with transition point in the distal ileum where there is an enhancing lesion measuring 1.9 x 0.9 cm. Findings are worrisome for neoplasm. Appreciate general surgery ongoing evaluation, plan for SBR on 2/14 if clinically stable EKG and 2D echocardiogram WNL with preserved EF PICC line and TPN placed for nutrition on 2/10 Pt remains on TPN and tolerating so far CT entero ordered by surgery 2/12.    COVID infection with fever Started on remdesivir to complete final dose 2/12.  No significant hypoxemia or findings on chest x-ray Continue ongoing treatment and airborne precautions   Hyponatremia mild/hypokalemia Na 129, no known cause at this time, though may be due to medication effect considering that patient is on HCTZ Continue to monitor while on TPN Replete potassium with IV and recheck in a.m.   Hypoalbuminemia Albumin 3.3, consider protein supplement when patient  resumes oral intake Currently on TPN for nutrition   Essential hypertension Continue IV hydralazine 10 mg every 6 hours as needed for SBP > 170 Consider oral meds when patient resumes oral intake   Hiatal hernia GERD Continue Protonix   Hemorrhoids Continue Preparation H  DVT prophylaxis: SCDs Code Status: Full  Family Communication:  Disposition: TBD    Consultants:  Surgery   Procedures:   Antimicrobials:    Subjective: Pt complaining of right hip pain, arthritis pain.   Objective: Vitals:   03/20/23 2116 03/21/23 0500 03/21/23 0613 03/21/23 1309  BP: (!) 160/79  (!) 157/63 (!) 163/82  Pulse: 97  89 91  Resp: (!) 22  18 16   Temp: 98.6 F (37 C)  99 F (37.2 C) 98.4 F (36.9 C)  TempSrc: Oral  Oral Oral  SpO2: 98%  99% 98%  Weight:  57.7 kg    Height:        Intake/Output Summary (Last 24 hours) at 03/21/2023 1628 Last data filed at 03/21/2023 1307 Gross per 24 hour  Intake 1752.96 ml  Output --  Net 1752.96 ml   Filed Weights   03/16/23 1427 03/20/23 0401 03/21/23 0500  Weight: 61.7 kg 58.5 kg 57.7 kg   Examination:  General exam: Appears calm and comfortable  Respiratory system: Clear to auscultation. Respiratory effort normal. Cardiovascular system: normal S1 & S2 heard. No JVD, murmurs, rubs, gallops or clicks. No pedal edema. Gastrointestinal system: Abdomen is mildly distended, soft and tender. No organomegaly or masses felt. Normal bowel sounds heard. Central nervous system: Alert and oriented. No focal neurological deficits. Extremities: Symmetric 5 x 5 power.  Skin: No rashes, lesions or ulcers. Psychiatry: Judgement and insight appear normal. Mood & affect appropriate.   Data Reviewed: I have personally reviewed following labs and imaging studies  CBC: Recent Labs  Lab 03/16/23 1448 03/17/23 0321 03/19/23 0555 03/20/23 0346 03/21/23 0438  WBC 7.5 7.6 5.7 4.4 4.0  HGB 12.6 12.0 10.9* 10.5* 10.8*  HCT 38.2 36.7 32.9* 32.9* 32.9*   MCV 90.7 90.0 91.1 91.6 91.4  PLT 202 195 172 165 147*    Basic Metabolic Panel: Recent Labs  Lab 03/17/23 0321 03/18/23 0423 03/19/23 0233 03/20/23 0346 03/21/23 0438  NA 131* 135 137 134* 136  K 3.4* 3.4* 4.0 3.3* 3.4*  CL 99 105 108 106 106  CO2 22 23 21* 22 22  GLUCOSE 100* 114* 94 113* 116*  BUN 12 7* <5* 9 14  CREATININE 0.77 0.79 0.65 0.77 0.67  CALCIUM 8.3* 8.1* 8.4* 8.0* 8.0*  MG 1.5* 1.8 1.6* 2.1 1.8  PHOS 2.6  --   --  1.9* 2.3*    CBG: Recent Labs  Lab 03/20/23 1059 03/20/23 1818 03/20/23 2311 03/21/23 0612 03/21/23 1147  GLUCAP 113* 142* 118* 144* 142*    Recent Results (from the past 240 hours)  Resp panel by RT-PCR (RSV, Flu A&B, Covid) Anterior Nasal Swab     Status: Abnormal   Collection Time: 03/19/23  9:30 AM   Specimen: Anterior Nasal Swab  Result Value Ref Range Status   SARS Coronavirus 2 by RT PCR POSITIVE (A) NEGATIVE Final    Comment: (NOTE) SARS-CoV-2 target nucleic acids are DETECTED.  The SARS-CoV-2 RNA is generally detectable in upper respiratory specimens during the acute phase of infection. Positive results are indicative of the presence of the identified virus, but do not rule out bacterial infection or co-infection with other pathogens not detected by the test. Clinical correlation with patient history and other diagnostic information is necessary to determine patient infection status. The expected result is Negative.  Fact Sheet for Patients: BloggerCourse.com  Fact Sheet for Healthcare Providers: SeriousBroker.it  This test is not yet approved or cleared by the [place] FDA and  has been authorized for detection and/or diagnosis of SARS-CoV-2 by FDA under an Emergency Use Authorization (EUA).  This EUA will remain in effect (meaning this test can be used) for the duration of  the COVID-19 declaration under Section 564(b)(1) of the A ct, 21 U.S.C. section  360bbb-3(b)(1), unless the authorization is terminated or revoked sooner.     Influenza A by PCR NEGATIVE NEGATIVE Final   Influenza B by PCR NEGATIVE NEGATIVE Final    Comment: (NOTE) The Xpert Xpress SARS-CoV-2/FLU/RSV plus assay is intended as an aid in the diagnosis of influenza from Nasopharyngeal swab specimens and should not be used as a sole basis for treatment. Nasal washings and aspirates are unacceptable for Xpert Xpress SARS-CoV-2/FLU/RSV testing.  Fact Sheet for Patients: BloggerCourse.com  Fact Sheet for Healthcare Providers: SeriousBroker.it  This test is not yet approved or cleared by the [place] FDA and has been authorized for detection and/or diagnosis of SARS-CoV-2 by FDA under an Emergency Use Authorization (EUA). This EUA will remain in effect (meaning this test can be used) for the duration of the COVID-19 declaration under Section 564(b)(1) of the Act, 21 U.S.C. section 360bbb-3(b)(1), unless the authorization is terminated or revoked.     Resp Syncytial Virus by PCR NEGATIVE NEGATIVE Final    Comment: (NOTE) Fact Sheet for Patients: BloggerCourse.com  Fact Sheet for Healthcare Providers:  SeriousBroker.it  This test is not yet approved or cleared by the [place] and has been authorized for detection and/or diagnosis of SARS-CoV-2 by FDA under an Emergency Use Authorization (EUA). This EUA will remain in effect (meaning this test can be used) for the duration of the COVID-19 declaration under Section 564(b)(1) of the Act, 21 U.S.C. section 360bbb-3(b)(1), unless the authorization is terminated or revoked.  Performed at [facility], [address], [place], [place] [address]   Culture, blood (Routine X 2) w Reflex to ID Panel     Status: None (Preliminary result)   Collection Time: 03/19/23  9:47 AM   Specimen: BLOOD  Result Value Ref  Range Status   Specimen Description BLOOD BLOOD LEFT FOREARM  Final   Special Requests   Final    BOTTLES DRAWN AEROBIC ONLY Blood Culture adequate volume   Culture   Final    NO GROWTH 2 DAYS Performed at [facility], [address], [place], [place] [address]    Report Status PENDING  Incomplete  Culture, blood (Routine X 2) w Reflex to ID Panel     Status: None (Preliminary result)   Collection Time: 03/19/23  9:52 AM   Specimen: BLOOD  Result Value Ref Range Status   Specimen Description BLOOD BLOOD LEFT HAND  Final   Special Requests   Final    BOTTLES DRAWN AEROBIC ONLY Blood Culture adequate volume   Culture   Final    NO GROWTH 2 DAYS Performed at [facility], [address] [place], [place] [address]    Report Status PENDING  Incomplete     Radiology Studies: DG Chest 1 View Result Date: 03/19/2023 CLINICAL DATA:  PICC line placement EXAM: CHEST  1 VIEW COMPARISON:  03/19/2023 FINDINGS: Single frontal view of the chest demonstrates right-sided PICC tip overlying superior vena cava. The cardiac silhouette is stable. Hiatal hernia again noted. Stable small left pleural effusion, with patchy left basilar consolidation. Right chest is clear. No pneumothorax. IMPRESSION: 1. Right-sided PICC line, tip overlying superior vena cava. 2. Small left pleural effusion, with patchy left basilar consolidation likely atelectasis. 3. Stable hiatal hernia. Electronically Signed   By: Sharlet Salina M.D.   On: 03/19/2023 22:43    Scheduled Meds:  ALPRAZolam  0.5-1 mg Oral BID   Chlorhexidine Gluconate Cloth  6 each Topical Daily   hydrocortisone cream   Topical BID   insulin aspart  0-9 Units Subcutaneous Q6H   metoprolol tartrate  50 mg Oral BID   pantoprazole (PROTONIX) IV  40 mg Intravenous Q24H   sodium chloride flush  10-40 mL Intracatheter Q12H   Continuous Infusions:  .TPN (CLINIMIX-E) Adult 65 mL/hr at 03/20/23 2249   potassium PHOSPHATE IVPB (in mmol) 10 mmol (03/21/23 1500)    TPN ADULT (ION)       LOS: 5 days   Time spent: 55 mins  Lambert Jeanty Laural Benes, MD How to contact the [facility] Attending or Consulting provider 7A - 7P or covering provider during after hours 7P -7A, for this patient?  Check the care team in [facility] and look for a) attending/consulting TRH provider listed and b) the [facility] team listed Log into [email or web address] to find provider on call.  Locate the [facility] provider you are looking for under [facility] and page to a number that you can be directly reached. If you still have difficulty reaching the provider, please page the [facility] (Director on Call) for the Hospitalists listed on amion for assistance.  03/21/2023, 4:28 PM

## 2023-03-21 NOTE — Hospital Course (Addendum)
89 y.o. female with medical history significant of essential hypertension, hiatal hernia, hemorrhoids who presents to the emergency department due to several months onset of intermittent abdominal pain which worsened last night.  Patient was admitted for small bowel obstruction.  General surgery was consulted and patient right hemicolectomy and SBR on 2/14.  EKG and 2D echocardiogram ordered for preop evaluation were re-assuring..  She had fever and further evaluation reveals COVID infection and she has been started on remdesivir and finished 3 day course. Fevers are improving and she has now had PICC line placement as well as initiation of TPN.  Post op management was directed by general surgery.  Ultimately, patient's diet was gradually advanced and TPN was discontinued on 2/18 dose.

## 2023-03-21 NOTE — Progress Notes (Signed)
[facility]  Patient doing fair. Had Bms yesterday. No complaints. Dennis in the room and discussed with them getting CT enterography to be sure that we are dealing with a SB mass and that surgery is definitely needed given her recent Covid infection and age.  BP (!) 157/63 (BP Location: Left Arm)   Pulse 89   Temp 99 F (37.2 C) (Oral)   Resp 18   Ht 5\' 6"  (1.676 m)   Wt 57.7 kg   SpO2 99%   BMI 20.53 kg/m  Soft, minimally distended, nontender  Patient with resolving SBO but SB mass on the Ct scan.  Repeat CT with enterography to give [place] more information before surgery since she is having Bms NPO TPN for nutrition  Discussed with team, with Maurine Minister and patient.   Algis Greenhouse, MD [facility] [address] [place], [place] [address] [phone] (office)

## 2023-03-21 NOTE — Progress Notes (Deleted)
   03/21/23 2016  Vitals  Temp 99.9 F (37.7 C)  Temp Source Oral  BP 124/88  MAP (mmHg) 100  BP Location Left Arm  BP Method Automatic  Patient Position (if appropriate) Lying  Pulse Rate (!) 116  Pulse Rate Source Monitor  Resp 18  MEWS COLOR  MEWS Score Color Yellow  Oxygen Therapy  SpO2 94 %  O2 Device Room Air  MEWS Score  MEWS Temp 0  MEWS Systolic 0  MEWS Pulse 2  MEWS RR 0  MEWS LOC 0  MEWS Score 2

## 2023-03-21 NOTE — Progress Notes (Signed)
Nurse at bedside,assisting patient to bedside commode,patient did have a bowel movement of watery brown stool.No c/o pain or discomfort noted.Patient cleaned up and assisted back to bed. Call bell in reach.Family at bedside.Plan of care on going.

## 2023-03-21 NOTE — Progress Notes (Signed)
Physical Therapy Treatment Patient Details Name: Dawn Estrada MRN: [id number] DOB: 1919/03/15 Today's Date: 03/21/2023   History of Present Illness PEr MD: MYA SUELL is a 89 y.o. female with medical history significant of essential hypertension, hiatal hernia, hemorrhoids who presents to the emergency department due to several months onset of intermittent abdominal pain which worsened last night.  Pain was mid abdominal and was rated as 10/10 on pain scale, this was associated with nausea, vomiting and some diarrhea last night.  There was no vomiting or diarrhea today.  Family was worried about dehydration and she was taken to the ED for further evaluation and management.    PT Comments  Patient presents up in chair and agreeable for therapy.  Patient demonstrates fair/good return for completing BLE ROM/strengthening exercises with verbal cueing, slow labored movement during gait training, no loss of balance and limited mostly due to c/o fatigue.  Patient tolerated staying up in chair after therapy with her son present. Patient will benefit from continued skilled physical therapy in hospital and recommended venue below to increase strength, balance, endurance for safe ADLs and gait.       If plan is discharge home, recommend the following: A little help with walking and/or transfers;A little help with bathing/dressing/bathroom;Help with stairs or ramp for entrance;Assistance with cooking/housework   Can travel by private vehicle        Equipment Recommendations  None recommended by PT    Recommendations for Other Services       Precautions / Restrictions Precautions Precautions: Fall Restrictions Weight Bearing Restrictions Per Provider Order: No     Mobility  Bed Mobility               General bed mobility comments: Patient presents seated in chair (assisted by nursing staff)    Transfers Overall transfer level: Needs assistance Equipment used: Rolling  walker (2 wheels) Transfers: Sit to/from Stand, Bed to chair/wheelchair/BSC Sit to Stand: Supervision   Step pivot transfers: Supervision       General transfer comment: increased time, labored movement    Ambulation/Gait Ambulation/Gait assistance: Supervision, Contact guard assist Gait Distance (Feet): 45 Feet Assistive device: Rolling walker (2 wheels) Gait Pattern/deviations: Decreased step length - right, Decreased step length - left, Decreased stride length, Trunk flexed Gait velocity: decreased     General Gait Details: slow labored movement with mild difficulty breathing mostly due ot wearing N95 mask, no loss of balance and limited most due to c/o fatigue   Stairs             Wheelchair Mobility     Tilt Bed    Modified Rankin (Stroke Patients Only)       Balance Overall balance assessment: Needs assistance Sitting-balance support: Feet supported, No upper extremity supported Sitting balance-Leahy Scale: Fair Sitting balance - Comments: fair/good seated in chair   Standing balance support: Reliant on assistive device for balance, During functional activity, Bilateral upper extremity supported Standing balance-Leahy Scale: Fair Standing balance comment: using RW                            Communication Communication Communication: No apparent difficulties  Cognition Arousal: Alert Behavior During Therapy: WFL for tasks assessed/performed                             Following commands: Intact      Cueing Cueing Techniques:  Verbal cues, Tactile cues  Exercises General Exercises - Lower Extremity Long Arc Quad: Seated, AROM, Strengthening, Both, 10 reps Hip Flexion/Marching: Seated, AROM, Strengthening, Both, 10 reps Toe Raises: Seated, AROM, Strengthening, Both, 10 reps Heel Raises: Seated, AROM, Strengthening, Both, 10 reps    General Comments        Pertinent Vitals/Pain Pain Assessment Pain Assessment:  No/denies pain    Home Living                          Prior Function            PT Goals (current goals can now be found in the care plan section) Acute Rehab PT Goals Patient Stated Goal: to go home PT Goal Formulation: With patient/family Time For Goal Achievement: 03/28/23 Potential to Achieve Goals: Good Progress towards PT goals: Progressing toward goals    Frequency    Min 3X/week      PT Plan      Co-evaluation              AM-PAC PT "6 Clicks" Mobility   Outcome Measure  Help needed turning from your back to your side while in a flat bed without using bedrails?: None Help needed moving from lying on your back to sitting on the side of a flat bed without using bedrails?: None Help needed moving to and from a bed to a chair (including a wheelchair)?: A Little Help needed standing up from a chair using your arms (e.g., wheelchair or bedside chair)?: A Little Help needed to walk in hospital room?: A Little Help needed climbing 3-5 steps with a railing? : A Little 6 Click Score: 20    End of Session Equipment Utilized During Treatment: Gait belt Activity Tolerance: Patient tolerated treatment well;Patient limited by fatigue Patient left: in chair;with call bell/phone within reach;with family/visitor present Nurse Communication: Mobility status PT Visit Diagnosis: Unsteadiness on feet (R26.81);Other abnormalities of gait and mobility (R26.89);Muscle weakness (generalized) (M62.81)     Time: 0225-0247 PT Time Calculation (min) (ACUTE ONLY): 22 min  Charges:    $Gait Training: 8-22 mins $Therapeutic Exercise: 8-22 mins PT General Charges $$ ACUTE PT VISIT: 1 Visit                     3:25 PM, 03/21/23 Ocie Bob, MPT Physical Therapist with [facility] [phone] office [phone] mobile phone

## 2023-03-21 NOTE — Progress Notes (Signed)
Mobility Specialist Progress Note:    03/21/23 1005  Mobility  Activity Transferred to/from BSC;Stood at bedside  Level of Assistance Minimal assist, patient does 75% or more  Assistive Device Front wheel walker  Distance Ambulated (ft) 8 ft  Range of Motion/Exercises Active;All extremities  Activity Response Tolerated well  Mobility Referral Yes  Mobility visit 1 Mobility  Mobility Specialist Start Time (ACUTE ONLY) 0945  Mobility Specialist Stop Time (ACUTE ONLY) 1000  Mobility Specialist Time Calculation (min) (ACUTE ONLY) 15 min   Pt received in bed, agreeable to mobility. Required MinA to stand and ambulate with RW. Tolerate well, asx throughout. Returned supine, alarm on. All needs met.   Lawerance Bach Mobility Specialist Please contact via Special educational needs teacher or  Rehab office at [phone]

## 2023-03-21 NOTE — Progress Notes (Signed)
PHARMACY - TOTAL PARENTERAL NUTRITION CONSULT NOTE   Indication: Small bowel obstruction  Patient Measurements: Height: 5\' 6"  (167.6 cm) Weight: 57.7 kg (127 lb 3.3 oz) IBW/kg (Calculated) : 59.3 TPN AdjBW (KG): 61.7 Body mass index is 20.53 kg/m.   Assessment:  Patient with a small bowel mass and obstruction. Needs surgery but is now COVID positive. TPN recommended for nutrition for now  with SBO.   Glucose / Insulin: 113-144- 2 units insulin given  Electrolytes: K 3.4 Phos 1.9 >> 2.3 Renal: Scr 0.65  Hepatic: WNL Albumin 3.0 GI Imaging/procedures: SBR and partial colectomy    Central access: PICC TPN start date: 2/10  Nutritional Goals: Goal TPN rate is 65 mL/hr (provides 81 g of protein and 1432 kcals per day)  RD Assessment: Estimated Needs Total Energy Estimated Needs: 1450-1650 Total Protein Estimated Needs: 75-85 gm Total Fluid Estimated Needs: >/= 1.5 L  Current Nutrition:  TPN  Plan:  Potassium chloride 10 mEq IV x 2 Potassium phosphate 10 mmol IV x 1   TPN at 65 mL/hr at 1800 Electrolytes in TPN: Na 67mEq/L, K 27mEq/L, Ca 31mEq/L, Mg 11mEq/L, and Phos 23mmol/L. Cl:Ac 1:1 Add standard MVI and trace elements to TPN Initiate Sensitive q6h SSI and adjust as needed  Monitor TPN labs on Mon/Thurs,   Judeth Cornfield, PharmD Clinical Pharmacist 03/21/2023 10:38 AM

## 2023-03-21 NOTE — Progress Notes (Deleted)
Pts HR is 116. Pt is currently a YELLOW MEWS. D. Elgergawy notified via secure chat of change.

## 2023-03-21 NOTE — Progress Notes (Signed)
Nurse at bedside,patient is alert and oriented to person,place,and situation,a little confused to time.Patient is up with assist to Bedside commode.Plan of care on going.

## 2023-03-21 NOTE — Progress Notes (Addendum)
Nurse at bedside,patient just finished one of the contrast bottles for further testing,she did fine,has now started on her second bottle,no issues so far. Family at bedside. Plan of care on going.

## 2023-03-22 DIAGNOSIS — K56609 Unspecified intestinal obstruction, unspecified as to partial versus complete obstruction: Secondary | ICD-10-CM | POA: Diagnosis not present

## 2023-03-22 DIAGNOSIS — K219 Gastro-esophageal reflux disease without esophagitis: Secondary | ICD-10-CM | POA: Diagnosis not present

## 2023-03-22 DIAGNOSIS — I10 Essential (primary) hypertension: Secondary | ICD-10-CM | POA: Diagnosis not present

## 2023-03-22 DIAGNOSIS — E876 Hypokalemia: Secondary | ICD-10-CM | POA: Diagnosis not present

## 2023-03-22 DIAGNOSIS — K6389 Other specified diseases of intestine: Secondary | ICD-10-CM | POA: Diagnosis not present

## 2023-03-22 LAB — COMPREHENSIVE METABOLIC PANEL
ALT: 14 U/L (ref 0–44)
AST: 25 U/L (ref 15–41)
Albumin: 2.6 g/dL — ABNORMAL LOW (ref 3.5–5.0)
Alkaline Phosphatase: 40 U/L (ref 38–126)
Anion gap: 7 (ref 5–15)
BUN: 18 mg/dL (ref 8–23)
CO2: 20 mmol/L — ABNORMAL LOW (ref 22–32)
Calcium: 8 mg/dL — ABNORMAL LOW (ref 8.9–10.3)
Chloride: 107 mmol/L (ref 98–111)
Creatinine, Ser: 0.67 mg/dL (ref 0.44–1.00)
GFR, Estimated: >60 mL/min (ref >60)
Glucose, Bld: 149 mg/dL — ABNORMAL HIGH (ref 70–99)
Potassium: 3.4 mmol/L — ABNORMAL LOW (ref 3.5–5.1)
Sodium: 134 mmol/L — ABNORMAL LOW (ref 135–145)
Total Bilirubin: 0.3 mg/dL (ref 0.0–1.2)
Total Protein: 5.7 g/dL — ABNORMAL LOW (ref 6.5–8.1)

## 2023-03-22 LAB — MAGNESIUM: Magnesium: 1.7 mg/dL (ref 1.7–2.4)

## 2023-03-22 LAB — PHOSPHORUS: Phosphorus: 3 mg/dL (ref 2.5–4.6)

## 2023-03-22 LAB — GLUCOSE, CAPILLARY
Glucose-Capillary: 223 mg/dL — ABNORMAL HIGH (ref 70–99)
Glucose-Capillary: 161 mg/dL — ABNORMAL HIGH (ref 70–99)
Glucose-Capillary: 143 mg/dL — ABNORMAL HIGH (ref 70–99)

## 2023-03-22 MED ORDER — SODIUM CHLORIDE 0.9 % IV SOLN
2.0000 g | INTRAVENOUS | Status: AC
  Administered 2023-03-23: 2 g via INTRAVENOUS
  Filled 2023-03-22: qty 2

## 2023-03-22 MED ORDER — TRAVASOL 10 % IV SOLN
INTRAVENOUS | Status: AC
Start: 2023-03-22 — End: 2023-03-23
  Filled 2023-03-22: qty 811.2

## 2023-03-22 MED ORDER — CHLORHEXIDINE GLUCONATE CLOTH 2 % EX PADS
6.0000 | MEDICATED_PAD | Freq: Once | CUTANEOUS | Status: AC
  Administered 2023-03-22: 6 via TOPICAL

## 2023-03-22 MED ORDER — ALPRAZOLAM 0.5 MG PO TABS
0.5000 mg | ORAL_TABLET | Freq: Once | ORAL | Status: AC
  Administered 2023-03-22: 0.5 mg via ORAL
  Filled 2023-03-22: qty 1

## 2023-03-22 MED ORDER — SODIUM CHLORIDE 0.9 % IV SOLN: INTRAVENOUS | Status: AC | PRN

## 2023-03-22 MED ORDER — POTASSIUM CHLORIDE 10 MEQ/100ML IV SOLN
10.0000 meq | INTRAVENOUS | Status: AC
  Administered 2023-03-22 (×2): 10 meq via INTRAVENOUS
  Filled 2023-03-22 (×2): qty 100

## 2023-03-22 NOTE — Plan of Care (Signed)
Problem: Elimination: Goal: Will not experience complications related to urinary retention Outcome: Progressing   Problem: Safety: Goal: Ability to remain free from injury will improve Outcome: Progressing   Problem: Skin Integrity: Goal: Risk for impaired skin integrity will decrease Outcome: Progressing

## 2023-03-22 NOTE — Progress Notes (Signed)
Mobility Specialist Progress Note:    03/22/23 1152  Mobility  Activity Ambulated with assistance to bathroom  Level of Assistance Minimal assist, patient does 75% or more  Assistive Device Front wheel walker  Distance Ambulated (ft) 20 ft  Range of Motion/Exercises Active;All extremities  Activity Response Tolerated well  Mobility Referral Yes  Mobility visit 1 Mobility  Mobility Specialist Start Time (ACUTE ONLY) 1125  Mobility Specialist Stop Time (ACUTE ONLY) 1145  Mobility Specialist Time Calculation (min) (ACUTE ONLY) 20 min   Pt received in bed, son in room. Requesting assistance to bathroom, required MinA to stand and ambulate with RW. Tolerated well, asx throughout. Returned pt supine, all needs  met.   Lawerance Bach Mobility Specialist Please contact via Special educational needs teacher or  Rehab office at [phone]

## 2023-03-22 NOTE — Progress Notes (Signed)
Consent signed for surgery in morning.  TPN infusing.  Assisted to bedside commode throughout day for voiding. Requested something for nerves and received one time order for xanax 0.5 mg . Sons have been at bedside off and on today.

## 2023-03-22 NOTE — Progress Notes (Signed)
PROGRESS NOTE   Dawn Estrada  ZOX:[id number] DOB: 01/27/1920 DOA: 03/16/2023 PCP: Kirstie Peri, MD   Chief Complaint  Patient presents with   Emesis   Level of care: Med-Surg  Brief Admission History:   89 y.o. female with medical history significant of essential hypertension, hiatal hernia, hemorrhoids who presents to the emergency department due to several months onset of intermittent abdominal pain which worsened last night.  Patient was admitted for small bowel obstruction.  General surgery consulted with plans for small bowel resection and possible partial colectomy in a.m.  EKG and 2D echocardiogram ordered for preop evaluation pending.  She had fever and further evaluation reveals COVID infection and she has been started on remdesivir.  Fevers are improving and she has now had PICC line placement as well as initiation of TPN on 2/10 and is awaiting possible surgical intervention on 2/14.    Assessment and Plan:  Small bowel obstruction CT abdomen and pelvis with contrast showed small bowel obstruction with transition point in the distal ileum where there is an enhancing lesion measuring 1.9 x 0.9 cm. Findings are worrisome for neoplasm. Appreciate general surgery ongoing evaluation, plan for SBR on 2/14 if clinically stable EKG and 2D echocardiogram WNL with preserved EF PICC line and TPN placed for nutrition on 2/10 Pt remains on TPN and tolerating so far CT entero ordered by surgery 2/12.    COVID infection with fever Started on remdesivir to complete final dose 2/12.  No significant hypoxemia or findings on chest x-ray Continue ongoing treatment and airborne precautions   Hyponatremia mild/hypokalemia Na 129, no known cause at this time, though may be due to medication effect considering that patient is on HCTZ Continue to monitor while on TPN Replete potassium with IV and recheck in a.m.   Hypoalbuminemia Albumin 3.3, consider protein supplement when patient  resumes oral intake Currently on TPN for nutrition   Essential hypertension Continue IV hydralazine 10 mg every 6 hours as needed for SBP > 170 Consider oral meds when patient resumes oral intake   Hiatal hernia GERD Continue Protonix   Hemorrhoids Continue Preparation H  DVT prophylaxis: SCDs Code Status: Full  Family Communication: long conversation with son Maurine Minister with Dr. Henreitta Leber 2/13  Disposition: TBD    Consultants:  Surgery   Procedures:   Antimicrobials:    Subjective: No specific complaints today.   Objective: Vitals:   03/21/23 2033 03/22/23 0500 03/22/23 0549 03/22/23 1354  BP: 130/78  (!) 141/71 133/69  Pulse: (!) 108  (!) 105 85  Resp: 20  20 16   Temp: 97.8 F (36.6 C)  97.9 F (36.6 C) 98 F (36.7 C)  TempSrc:    Oral  SpO2: 99%  99% 99%  Weight:  57.8 kg    Height:        Intake/Output Summary (Last 24 hours) at 03/22/2023 1405 Last data filed at 03/22/2023 0500 Gross per 24 hour  Intake 604.92 ml  Output --  Net 604.92 ml   Filed Weights   03/20/23 0401 03/21/23 0500 03/22/23 0500  Weight: 58.5 kg 57.7 kg 57.8 kg   Examination:  General exam: Appears calm and comfortable  Respiratory system: Clear to auscultation. Respiratory effort normal. Cardiovascular system: normal S1 & S2 heard. No JVD, murmurs, rubs, gallops or clicks. No pedal edema. Gastrointestinal system: Abdomen is mildly distended, soft and tender. No organomegaly or masses felt. Normal bowel sounds heard. Central nervous system: Alert and oriented. No focal neurological deficits. Extremities: Symmetric  5 x 5 power. Skin: No rashes, lesions or ulcers. Psychiatry: Judgement and insight appear normal. Mood & affect appropriate.   Data Reviewed: I have personally reviewed following labs and imaging studies  CBC: Recent Labs  Lab 03/16/23 1448 03/17/23 0321 03/19/23 0555 03/20/23 0346 03/21/23 0438  WBC 7.5 7.6 5.7 4.4 4.0  HGB 12.6 12.0 10.9* 10.5* 10.8*  HCT 38.2  36.7 32.9* 32.9* 32.9*  MCV 90.7 90.0 91.1 91.6 91.4  PLT 202 195 172 165 147*    Basic Metabolic Panel: Recent Labs  Lab 03/17/23 0321 03/18/23 0423 03/19/23 0233 03/20/23 0346 03/21/23 0438 03/22/23 0412  NA 131* 135 137 134* 136 134*  K 3.4* 3.4* 4.0 3.3* 3.4* 3.4*  CL 99 105 108 106 106 107  CO2 22 23 21* 22 22 20*  GLUCOSE 100* 114* 94 113* 116* 149*  BUN 12 7* <5* 9 14 18   CREATININE 0.77 0.79 0.65 0.77 0.67 0.67  CALCIUM 8.3* 8.1* 8.4* 8.0* 8.0* 8.0*  MG 1.5* 1.8 1.6* 2.1 1.8 1.7  PHOS 2.6  --   --  1.9* 2.3* 3.0    CBG: Recent Labs  Lab 03/21/23 1147 03/21/23 1818 03/21/23 2330 03/22/23 0634 03/22/23 1332  GLUCAP 142* 140* 85 223* 161*    Recent Results (from the past 240 hours)  Resp panel by RT-PCR (RSV, Flu A&B, Covid) Anterior Nasal Swab     Status: Abnormal   Collection Time: 03/19/23  9:30 AM   Specimen: Anterior Nasal Swab  Result Value Ref Range Status   SARS Coronavirus 2 by RT PCR POSITIVE (A) NEGATIVE Final    Comment: (NOTE) SARS-CoV-2 target nucleic acids are DETECTED.  The SARS-CoV-2 RNA is generally detectable in upper respiratory specimens during the acute phase of infection. Positive results are indicative of the presence of the identified virus, but do not rule out bacterial infection or co-infection with other pathogens not detected by the test. Clinical correlation with patient history and other diagnostic information is necessary to determine patient infection status. The expected result is Negative.  Fact Sheet for Patients: BloggerCourse.com  Fact Sheet for Healthcare Providers: SeriousBroker.it  This test is not yet approved or cleared by the [place] FDA and  has been authorized for detection and/or diagnosis of SARS-CoV-2 by FDA under an Emergency Use Authorization (EUA).  This EUA will remain in effect (meaning this test can be used) for the duration of  the  COVID-19 declaration under Section 564(b)(1) of the A ct, 21 U.S.C. section 360bbb-3(b)(1), unless the authorization is terminated or revoked sooner.     Influenza A by PCR NEGATIVE NEGATIVE Final   Influenza B by PCR NEGATIVE NEGATIVE Final    Comment: (NOTE) The Xpert Xpress SARS-CoV-2/FLU/RSV plus assay is intended as an aid in the diagnosis of influenza from Nasopharyngeal swab specimens and should not be used as a sole basis for treatment. Nasal washings and aspirates are unacceptable for Xpert Xpress SARS-CoV-2/FLU/RSV testing.  Fact Sheet for Patients: BloggerCourse.com  Fact Sheet for Healthcare Providers: SeriousBroker.it  This test is not yet approved or cleared by the [place] FDA and has been authorized for detection and/or diagnosis of SARS-CoV-2 by FDA under an Emergency Use Authorization (EUA). This EUA will remain in effect (meaning this test can be used) for the duration of the COVID-19 declaration under Section 564(b)(1) of the Act, 21 U.S.C. section 360bbb-3(b)(1), unless the authorization is terminated or revoked.     Resp Syncytial Virus by PCR NEGATIVE NEGATIVE Final  Comment: (NOTE) Fact Sheet for Patients: BloggerCourse.com  Fact Sheet for Healthcare Providers: SeriousBroker.it  This test is not yet approved or cleared by the [place] FDA and has been authorized for detection and/or diagnosis of SARS-CoV-2 by FDA under an Emergency Use Authorization (EUA). This EUA will remain in effect (meaning this test can be used) for the duration of the COVID-19 declaration under Section 564(b)(1) of the Act, 21 U.S.C. section 360bbb-3(b)(1), unless the authorization is terminated or revoked.  Performed at [facility], [address] [place], [place] [address]   Culture, blood (Routine X 2) w Reflex to ID Panel     Status: None (Preliminary result)    Collection Time: 03/19/23  9:47 AM   Specimen: BLOOD  Result Value Ref Range Status   Specimen Description BLOOD BLOOD LEFT FOREARM  Final   Special Requests   Final    BOTTLES DRAWN AEROBIC ONLY Blood Culture adequate volume   Culture   Final    NO GROWTH 3 DAYS Performed at [facility], [address], [place], [place] [address]    Report Status PENDING  Incomplete  Culture, blood (Routine X 2) w Reflex to ID Panel     Status: None (Preliminary result)   Collection Time: 03/19/23  9:52 AM   Specimen: BLOOD  Result Value Ref Range Status   Specimen Description BLOOD BLOOD LEFT HAND  Final   Special Requests   Final    BOTTLES DRAWN AEROBIC ONLY Blood Culture adequate volume   Culture   Final    NO GROWTH 3 DAYS Performed at [facility], [address], [place], [place] [address]    Report Status PENDING  Incomplete     Radiology Studies: CT ENTERO ABD/PELVIS W CONTAST Result Date: 03/21/2023 CLINICAL DATA:  Small bowel mass, preop. EXAM: CT ABDOMEN AND PELVIS WITH CONTRAST (ENTEROGRAPHY) TECHNIQUE: Multidetector CT of the abdomen and pelvis during bolus administration of intravenous contrast. Negative oral contrast was given. RADIATION DOSE REDUCTION: This exam was performed according to the departmental dose-optimization program which includes automated exposure control, adjustment of the mA and/or kV according to patient size and/or use of iterative reconstruction technique. CONTRAST:  OMNIPAQUE IOHEXOL 300 MG/ML  SOLN COMPARISON:  03/16/2023. FINDINGS: Lower chest: Trace left pleural fluid. Scarring in the lingula. Heart is at the upper limits of normal in size to mildly enlarged. No pericardial effusion. Moderate hiatal hernia. Hepatobiliary: Hepatic cysts. No specific follow-up necessary. Cholecystectomy. No unexpected biliary ductal dilatation. Pancreas: Negative. Spleen: Negative. Adrenals/Urinary Tract: Adrenal glands are unremarkable. Bilateral renal cysts. No specific  follow-up necessary. Ureters are decompressed. Bladder is grossly unremarkable. Stomach/Bowel: Moderate hiatal hernia. Enhancing lesions seen in the distal small bowel on 03/16/2023 is not as well visualized. There may be a sessile lesion in the distal small bowel measuring 0.8 x 2.1 cm (3/138). Small bowel and colon are otherwise unremarkable. Appendix is not readily visualized. Vascular/Lymphatic: Atherosclerotic calcification of the aorta. No pathologically enlarged lymph nodes. Reproductive: Hysterectomy.  No adnexal mass. Other: Minimal presacral edema. No free fluid. Mesenteries and peritoneum are unremarkable. Musculoskeletal: Degenerative changes in the spine. IMPRESSION: 1. Possible sessile lesion in the distal small bowel to correspond to the questioned lesion on 03/16/2023. 2. Trace left pleural fluid. 3. Moderate hiatal hernia. 4.  Aortic atherosclerosis (ICD10-I70.0). Electronically Signed   By: Leanna Battles M.D.   On: 03/21/2023 17:33    Scheduled Meds:  ALPRAZolam  0.5-1 mg Oral BID   Chlorhexidine Gluconate Cloth  6  each Topical Daily   Chlorhexidine Gluconate Cloth  6 each Topical Once   And   Chlorhexidine Gluconate Cloth  6 each Topical Once   hydrocortisone cream   Topical BID   insulin aspart  0-9 Units Subcutaneous Q6H   metoprolol tartrate  50 mg Oral BID   pantoprazole (PROTONIX) IV  40 mg Intravenous Q24H   sodium chloride flush  10-40 mL Intracatheter Q12H   Continuous Infusions:  sodium chloride 10 mL/hr at 03/22/23 1056   [START ON 03/23/2023] cefoTEtan (CEFOTAN) IV     potassium chloride 10 mEq (03/22/23 1347)   TPN ADULT (ION) 65 mL/hr at 03/21/23 1825   TPN ADULT (ION)       LOS: 6 days   Time spent: 60 mins  Suheyla Mortellaro Laural Benes, MD How to contact the [facility] Attending or Consulting provider 7A - 7P or covering provider during after hours 7P -7A, for this patient?  Check the care team in [facility] and look for a) attending/consulting [facility] provider listed and b) the [facility]  team listed Log into [email or web address] to find provider on call.  Locate the [facility] provider you are looking for under [facility] and page to a number that you can be directly reached. If you still have difficulty reaching the provider, please page the [facility] (Director on Call) for the Hospitalists listed on amion for assistance.  03/22/2023, 2:05 PM

## 2023-03-22 NOTE — Progress Notes (Signed)
[facility] Progress Note  * Surgery Date in Future *  Subjective: Saw patient, no complaints. Talked to [facility] on the phone with Dr. Laural Benes.   Objective: Vital signs in last 24 hours: Temp:  [97.8 F (36.6 C)-98.4 F (36.9 C)] 97.9 F (36.6 C) (02/13 0549) Pulse Rate:  [91-108] 105 (02/13 0549) Resp:  [16-20] 20 (02/13 0549) BP: (130-163)/(71-82) 141/71 (02/13 0549) SpO2:  [98 %-99 %] 99 % (02/13 0549) Weight:  [57.8 kg] 57.8 kg (02/13 0500) Last BM Date : 03/21/23  Intake/Output from previous day: 02/12 0701 - 02/13 0700 In: 1577.7 [I.V.:1186.6; IV Piggyback:391.1] Out: -  Intake/Output this shift: No intake/output data recorded.  General appearance: alert and no distress GI: distended mildly, nontender, soft  Lab Results:  Recent Labs    03/20/23 0346 03/21/23 0438  WBC 4.4 4.0  HGB 10.5* 10.8*  HCT 32.9* 32.9*  PLT 165 147*   BMET Recent Labs    03/21/23 0438 03/22/23 0412  NA 136 134*  K 3.4* 3.4*  CL 106 107  CO2 22 20*  GLUCOSE 116* 149*  BUN 14 18  CREATININE 0.67 0.67  CALCIUM 8.0* 8.0*   PT/INR No results for input(s): "LABPROT", "INR" in the last 72 hours.  Studies/Results: CT ENTERO ABD/PELVIS W CONTAST Result Date: 03/21/2023 CLINICAL DATA:  Small bowel mass, preop. EXAM: CT ABDOMEN AND PELVIS WITH CONTRAST (ENTEROGRAPHY) TECHNIQUE: Multidetector CT of the abdomen and pelvis during bolus administration of intravenous contrast. Negative oral contrast was given. RADIATION DOSE REDUCTION: This exam was performed according to the departmental dose-optimization program which includes automated exposure control, adjustment of the mA and/or kV according to patient size and/or use of iterative reconstruction technique. CONTRAST:  OMNIPAQUE IOHEXOL 300 MG/ML  SOLN COMPARISON:  03/16/2023. FINDINGS: Lower chest: Trace left pleural fluid. Scarring in the lingula. Heart is at the upper limits of normal in size to mildly enlarged. No  pericardial effusion. Moderate hiatal hernia. Hepatobiliary: Hepatic cysts. No specific follow-up necessary. Cholecystectomy. No unexpected biliary ductal dilatation. Pancreas: Negative. Spleen: Negative. Adrenals/Urinary Tract: Adrenal glands are unremarkable. Bilateral renal cysts. No specific follow-up necessary. Ureters are decompressed. Bladder is grossly unremarkable. Stomach/Bowel: Moderate hiatal hernia. Enhancing lesions seen in the distal small bowel on 03/16/2023 is not as well visualized. There may be a sessile lesion in the distal small bowel measuring 0.8 x 2.1 cm (3/138). Small bowel and colon are otherwise unremarkable. Appendix is not readily visualized. Vascular/Lymphatic: Atherosclerotic calcification of the aorta. No pathologically enlarged lymph nodes. Reproductive: Hysterectomy.  No adnexal mass. Other: Minimal presacral edema. No free fluid. Mesenteries and peritoneum are unremarkable. Musculoskeletal: Degenerative changes in the spine. IMPRESSION: 1. Possible sessile lesion in the distal small bowel to correspond to the questioned lesion on 03/16/2023. 2. Trace left pleural fluid. 3. Moderate hiatal hernia. 4.  Aortic atherosclerosis (ICD10-I70.0). Electronically Signed   By: Leanna Battles M.D.   On: 03/21/2023 17:33    Anti-infectives: Anti-infectives (From admission, onward)    Start     Dose/Rate Route Frequency Ordered Stop   03/20/23 1000  remdesivir 100 mg in sodium chloride 0.9 % 100 mL IVPB  Status:  Discontinued       Placed in "Followed by" Linked Group   100 mg 200 mL/hr over 30 Minutes Intravenous Daily 03/19/23 1324 03/19/23 1348   03/20/23 1000  remdesivir 100 mg in sodium chloride 0.9 % 100 mL IVPB       Placed in "Followed by" Linked Group  100 mg 200 mL/hr over 30 Minutes Intravenous Daily 03/19/23 1348 03/21/23 1234   03/19/23 1445  remdesivir 100 mg in sodium chloride 0.9 % 100 mL IVPB       Placed in "Followed by" Linked Group   100 mg 200 mL/hr over  30 Minutes Intravenous Every 30 min 03/19/23 1348 03/19/23 1701   03/19/23 1415  remdesivir 200 mg in sodium chloride 0.9% 250 mL IVPB  Status:  Discontinued       Placed in "Followed by" Linked Group   200 mg 580 mL/hr over 30 Minutes Intravenous Once 03/19/23 1324 03/19/23 1348   03/19/23 0700  cefoTEtan (CEFOTAN) 2 g in sodium chloride 0.9 % 100 mL IVPB        2 g 200 mL/hr over 30 Minutes Intravenous On call to O.R. 03/18/23 1104 03/20/23 0559   03/18/23 1200  cefoTEtan (CEFOTAN) 2 g in sodium chloride 0.9 % 100 mL IVPB  Status:  Discontinued        2 g 200 mL/hr over 30 Minutes Intravenous On call to O.R. 03/18/23 1104 03/18/23 1104       Assessment/Plan: Patient with concern for SB mass and resolving SBO. The CT enterography sees an area where they think there is  2cm sessile mass corresponding to the enhancing lesion noted on the prior CT. Patient feeling ok. Has been on TPN. Fever curves better. No O2. Has been on treatment for her COVID and doing well.   Possible SB mass that could cause another obstruction or could be a cancer. Discussed with Maurine Minister and patient that if we do surgery,  I could find nothing, I could find and area needing resected and she has risk of bleeding, infection, anastomotic, leak. She has extensive midline scars so she could have lots of scarring in the abdomen. Discussed that this could be only window for surgery and if we opted to do nothing and try diet, she could get obstructed immediately or she could go home and return. The obstruction could be worse, she could aspirate and get pneumonia, she may not be candidate for surgery at the time of representing due to other factors. The mass could continue to grow and spread if it is a cancer or it could perforate. All of this could require emergency surgery or she could be too frail or sick to survive.   At this time we have her posted but discussed that this is a difficult situation as she is not currently  obstructed but with the question of a mass I would think it would be highly likely to occur again.   Maurine Minister is going to talk to the patient and his brothers and let us know their final decision.    LOS: 6 days    [name] 03/22/2023

## 2023-03-22 NOTE — Progress Notes (Signed)
Nutrition Follow-up  DOCUMENTATION CODES:   Not applicable  INTERVENTION:   TPN dosing per Pharmacy to meet 100% of nutrition needs.  NUTRITION DIAGNOSIS:   Inadequate oral intake related to inability to eat as evidenced by NPO status.  Ongoing   GOAL:   Patient will meet greater than or equal to 90% of their needs  Met with TPN  MONITOR:   I & O's, Diet advancement  REASON FOR ASSESSMENT:   Consult New TPN/TNA  ASSESSMENT:   89 yo female admitted with SBO, small bowel mass. Tested positive for COVID-19 on 2/10. PMH includes HTN, reflux, hiatal hernia, anxiety, arthritis.  PICC placed and TPN initiated on 2/10. Currently receiving TPN at 65 ml/h to provide 1432 kcal and 81 gm protein daily, meeting 99% of minimum kcal needs and 100% of minimum protein needs.   Patient remains NPO. SBO is slowly resolving. Family is deciding on possible small bowel resection for SB mass.   Labs reviewed. Na 134, K 3.4, phos 3 WNL after repletion CBG: 223-161  Medications reviewed and include novolog, protonix, IV potassium chloride x 2 runs this morning.   Diet Order:   Diet Order             Diet NPO time specified  Diet effective midnight           Diet NPO time specified  Diet effective midnight                   EDUCATION NEEDS:   No education needs have been identified at this time  Skin:  Skin Assessment: Reviewed RN Assessment  Last BM:  2/13 type 6  Height:   Ht Readings from Last 1 Encounters:  03/16/23 5\' 6"  (1.676 m)    Weight:   Wt Readings from Last 1 Encounters:  03/22/23 57.8 kg    Ideal Body Weight:  59.1 kg  BMI:  Body mass index is 20.57 kg/m.  Estimated Nutritional Needs:   Kcal:  1450-1650  Protein:  75-85 gm  Fluid:  >/= 1.5 L   Gabriel Rainwater RD, LDN, CNSC Contact via secure chat. If unavailable, use group chat "RD Inpatient."

## 2023-03-22 NOTE — Progress Notes (Signed)
PHARMACY - TOTAL PARENTERAL NUTRITION CONSULT NOTE   Indication: Small bowel obstruction  Patient Measurements: Height: 5\' 6"  (167.6 cm) Weight: 57.8 kg (127 lb 6.8 oz) IBW/kg (Calculated) : 59.3 TPN AdjBW (KG): 61.7 Body mass index is 20.57 kg/m.   Assessment:  Patient with a small bowel mass and obstruction. Needs surgery but is now COVID positive. TPN recommended for nutrition for now with SBO which is slowly resolving but also with SB mass that will need surgery. Will continue TPN for now  Glucose / Insulin: 85-223- 5 units insulin given  Electrolytes: K 3.4 Phos 3.0 Renal: Scr 0.67 Hepatic: WNL Albumin 2.6 GI Imaging/procedures: SBR and partial colectomy   Central access: PICC TPN start date: 2/10  Nutritional Goals: Goal TPN rate is 65 mL/hr (provides 81 g of protein and 1432 kcals per day)  RD Assessment: Estimated Needs Total Energy Estimated Needs: 1450-1650 Total Protein Estimated Needs: 75-85 gm Total Fluid Estimated Needs: >/= 1.5 L  Current Nutrition:  TPN  Plan:  Potassium chloride 10 mEq IV x 2  TPN at 65 mL/hr at 1800 Electrolytes in TPN: Na 51mEq/L, K 49mEq/L, Ca 15mEq/L, Mg 79mEq/L, and Phos 29mmol/L. Cl:Ac 1:1 Add standard MVI and trace elements to TPN Continue Sensitive q6h SSI and adjust as needed -if CBGs continue to trend up will adjust tomorrow Monitor TPN labs on Mon/Thurs  Sheppard Coil PharmD., BCPS Clinical Pharmacist 03/22/2023 9:16 AM

## 2023-03-23 ENCOUNTER — Inpatient Hospital Stay (HOSPITAL_COMMUNITY): Payer: Medicare Other | Admitting: Anesthesiology

## 2023-03-23 ENCOUNTER — Inpatient Hospital Stay (HOSPITAL_COMMUNITY): Payer: Medicare Other

## 2023-03-23 ENCOUNTER — Encounter (HOSPITAL_COMMUNITY)
Admission: EM | Disposition: A | Discharge status: Home Health | Payer: Self-pay | Source: Home / Self Care | Attending: Family Medicine

## 2023-03-23 ENCOUNTER — Encounter (HOSPITAL_COMMUNITY): Payer: Self-pay | Admitting: Internal Medicine

## 2023-03-23 DIAGNOSIS — E876 Hypokalemia: Secondary | ICD-10-CM | POA: Diagnosis not present

## 2023-03-23 DIAGNOSIS — K565 Intestinal adhesions [bands], unspecified as to partial versus complete obstruction: Secondary | ICD-10-CM | POA: Diagnosis not present

## 2023-03-23 DIAGNOSIS — K56609 Unspecified intestinal obstruction, unspecified as to partial versus complete obstruction: Secondary | ICD-10-CM | POA: Diagnosis not present

## 2023-03-23 DIAGNOSIS — I10 Essential (primary) hypertension: Secondary | ICD-10-CM

## 2023-03-23 DIAGNOSIS — K219 Gastro-esophageal reflux disease without esophagitis: Secondary | ICD-10-CM | POA: Diagnosis not present

## 2023-03-23 DIAGNOSIS — F419 Anxiety disorder, unspecified: Secondary | ICD-10-CM

## 2023-03-23 DIAGNOSIS — K6389 Other specified diseases of intestine: Secondary | ICD-10-CM | POA: Diagnosis not present

## 2023-03-23 HISTORY — PX: BOWEL RESECTION: SHX1257

## 2023-03-23 LAB — GLUCOSE, CAPILLARY
Glucose-Capillary: 146 mg/dL — ABNORMAL HIGH (ref 70–99)
Glucose-Capillary: 173 mg/dL — ABNORMAL HIGH (ref 70–99)
Glucose-Capillary: 193 mg/dL — ABNORMAL HIGH (ref 70–99)
Glucose-Capillary: 224 mg/dL — ABNORMAL HIGH (ref 70–99)
Glucose-Capillary: 241 mg/dL — ABNORMAL HIGH (ref 70–99)

## 2023-03-23 SURGERY — EXCISION, SMALL INTESTINE
Anesthesia: General | Site: Abdomen

## 2023-03-23 MED ORDER — PHENYLEPHRINE 80 MCG/ML (10ML) SYRINGE FOR IV PUSH (FOR BLOOD PRESSURE SUPPORT)
PREFILLED_SYRINGE | INTRAVENOUS | Status: AC
  Filled 2023-03-23: qty 10

## 2023-03-23 MED ORDER — MEPERIDINE HCL 50 MG/ML IJ SOLN: 6.2500 mg | INTRAMUSCULAR | Status: DC | PRN

## 2023-03-23 MED ORDER — BUPIVACAINE HCL (PF) 0.5 % IJ SOLN
INTRAMUSCULAR | Status: AC
  Filled 2023-03-23: qty 30

## 2023-03-23 MED ORDER — FENTANYL CITRATE PF 50 MCG/ML IJ SOSY
25.0000 ug | PREFILLED_SYRINGE | INTRAMUSCULAR | Status: DC | PRN
  Administered 2023-03-23 (×2): 25 ug via INTRAVENOUS
  Filled 2023-03-23: qty 1

## 2023-03-23 MED ORDER — SUGAMMADEX SODIUM 200 MG/2ML IV SOLN
INTRAVENOUS | Status: DC | PRN
  Administered 2023-03-23: 118 mg via INTRAVENOUS

## 2023-03-23 MED ORDER — PHENYLEPHRINE 80 MCG/ML (10ML) SYRINGE FOR IV PUSH (FOR BLOOD PRESSURE SUPPORT)
PREFILLED_SYRINGE | INTRAVENOUS | Status: DC | PRN
  Administered 2023-03-23 (×2): 40 ug via INTRAVENOUS
  Administered 2023-03-23: 80 ug via INTRAVENOUS
  Administered 2023-03-23 (×3): 160 ug via INTRAVENOUS
  Administered 2023-03-23 (×2): 80 ug via INTRAVENOUS
  Administered 2023-03-23: 40 ug via INTRAVENOUS
  Administered 2023-03-23: 160 ug via INTRAVENOUS

## 2023-03-23 MED ORDER — BUPIVACAINE HCL (PF) 0.5 % IJ SOLN
INTRAMUSCULAR | Status: DC | PRN
  Administered 2023-03-23: 30 mL

## 2023-03-23 MED ORDER — POVIDONE-IODINE 10 % EX OINT
TOPICAL_OINTMENT | CUTANEOUS | Status: AC
  Filled 2023-03-23: qty 28.4

## 2023-03-23 MED ORDER — FENTANYL CITRATE (PF) 100 MCG/2ML IJ SOLN
INTRAMUSCULAR | Status: DC | PRN
  Administered 2023-03-23 (×3): 25 ug via INTRAVENOUS

## 2023-03-23 MED ORDER — SODIUM CHLORIDE 0.9 % IV SOLN
INTRAVENOUS | Status: AC
  Filled 2023-03-23: qty 2

## 2023-03-23 MED ORDER — ALBUMIN HUMAN 5 % IV SOLN: INTRAVENOUS | Status: DC | PRN

## 2023-03-23 MED ORDER — LACTATED RINGERS IV SOLN: INTRAVENOUS | Status: DC

## 2023-03-23 MED ORDER — TRAVASOL 10 % IV SOLN
INTRAVENOUS | Status: AC
  Filled 2023-03-23: qty 811.2

## 2023-03-23 MED ORDER — ROCURONIUM BROMIDE 100 MG/10ML IV SOLN
INTRAVENOUS | Status: DC | PRN
  Administered 2023-03-23: 50 mg via INTRAVENOUS
  Administered 2023-03-23 (×2): 10 mg via INTRAVENOUS

## 2023-03-23 MED ORDER — ORAL CARE MOUTH RINSE: 15.0000 mL | Freq: Once | OROMUCOSAL | Status: DC

## 2023-03-23 MED ORDER — LATANOPROSTENE BUNOD 0.024 % OP SOLN
1.0000 [drp] | Freq: Every day | OPHTHALMIC | Status: DC
  Administered 2023-03-27: 1 [drp] via OPHTHALMIC

## 2023-03-23 MED ORDER — ESMOLOL HCL 100 MG/10ML IV SOLN
INTRAVENOUS | Status: DC | PRN
  Administered 2023-03-23: 10 mg via INTRAVENOUS

## 2023-03-23 MED ORDER — CHLORHEXIDINE GLUCONATE 0.12 % MT SOLN: 15.0000 mL | Freq: Once | OROMUCOSAL | Status: DC

## 2023-03-23 MED ORDER — METOCLOPRAMIDE HCL 5 MG/ML IJ SOLN: 10.0000 mg | Freq: Once | INTRAMUSCULAR | Status: DC | PRN

## 2023-03-23 MED ORDER — TIMOLOL MALEATE 0.5 % OP SOLN
1.0000 [drp] | Freq: Every day | OPHTHALMIC | Status: DC
  Administered 2023-03-23 – 2023-03-29 (×7): 1 [drp] via OPHTHALMIC
  Filled 2023-03-23: qty 5

## 2023-03-23 MED ORDER — ESMOLOL HCL 100 MG/10ML IV SOLN
INTRAVENOUS | Status: AC
  Filled 2023-03-23: qty 10

## 2023-03-23 MED ORDER — LIDOCAINE HCL (PF) 2 % IJ SOLN
INTRAMUSCULAR | Status: AC
  Filled 2023-03-23: qty 5

## 2023-03-23 MED ORDER — ONDANSETRON HCL 4 MG/2ML IJ SOLN
INTRAMUSCULAR | Status: DC | PRN
  Administered 2023-03-23: 4 mg via INTRAVENOUS

## 2023-03-23 MED ORDER — PROPOFOL 10 MG/ML IV BOLUS
INTRAVENOUS | Status: DC | PRN
Start: 2023-03-23 — End: 2023-03-23
  Administered 2023-03-23: 20 mg via INTRAVENOUS
  Administered 2023-03-23: 50 mg via INTRAVENOUS

## 2023-03-23 MED ORDER — SODIUM CHLORIDE 0.9 % IR SOLN
Status: DC | PRN
  Administered 2023-03-23: 500 mL
  Administered 2023-03-23 (×3): 1000 mL

## 2023-03-23 MED ORDER — FENTANYL CITRATE PF 50 MCG/ML IJ SOSY
12.5000 ug | PREFILLED_SYRINGE | INTRAMUSCULAR | Status: DC | PRN
  Administered 2023-03-23 – 2023-03-24 (×6): 12.5 ug via INTRAVENOUS
  Administered 2023-03-25 (×2): 25 ug via INTRAVENOUS
  Administered 2023-03-27 – 2023-03-28 (×3): 12.5 ug via INTRAVENOUS
  Filled 2023-03-23 (×13): qty 1

## 2023-03-23 MED ORDER — MORPHINE SULFATE (PF) 2 MG/ML IV SOLN: 2.0000 mg | INTRAVENOUS | Status: DC | PRN

## 2023-03-23 MED ORDER — ONDANSETRON HCL 4 MG/2ML IJ SOLN
INTRAMUSCULAR | Status: AC
  Filled 2023-03-23: qty 2

## 2023-03-23 MED ORDER — ROCURONIUM BROMIDE 10 MG/ML (PF) SYRINGE
PREFILLED_SYRINGE | INTRAVENOUS | Status: AC
  Filled 2023-03-23: qty 10

## 2023-03-23 MED ORDER — FENTANYL CITRATE (PF) 100 MCG/2ML IJ SOLN
INTRAMUSCULAR | Status: AC
  Filled 2023-03-23: qty 2

## 2023-03-23 MED ORDER — PHENYLEPHRINE HCL-NACL 20-0.9 MG/250ML-% IV SOLN
INTRAVENOUS | Status: DC | PRN
  Administered 2023-03-23: 25 ug/min via INTRAVENOUS

## 2023-03-23 MED ORDER — LORAZEPAM 2 MG/ML IJ SOLN: 0.5000 mg | INTRAMUSCULAR | Status: DC | PRN

## 2023-03-23 MED ORDER — PROPOFOL 10 MG/ML IV BOLUS
INTRAVENOUS | Status: AC
Start: 2023-03-23 — End: ?
  Filled 2023-03-23: qty 20

## 2023-03-23 MED ORDER — METOPROLOL TARTRATE 5 MG/5ML IV SOLN
5.0000 mg | Freq: Four times a day (QID) | INTRAVENOUS | Status: DC
  Administered 2023-03-23 – 2023-03-24 (×3): 5 mg via INTRAVENOUS
  Filled 2023-03-23 (×3): qty 5

## 2023-03-23 MED ORDER — LIDOCAINE HCL (PF) 2 % IJ SOLN
INTRAMUSCULAR | Status: DC | PRN
  Administered 2023-03-23: 30 mg via INTRADERMAL

## 2023-03-23 SURGICAL SUPPLY — 40 items
CHLORAPREP W/TINT 26 (MISCELLANEOUS) ×1 IMPLANT
CLOTH BEACON ORANGE TIMEOUT ST (SAFETY) ×1 IMPLANT
COVER LIGHT HANDLE STERIS (MISCELLANEOUS) ×2 IMPLANT
COVER MAYO STAND XLG (MISCELLANEOUS) IMPLANT
DRAPE HALF SHEET 40X57 (DRAPES) IMPLANT
DRAPE UTILITY W/TAPE 26X15 (DRAPES) IMPLANT
DRAPE WARM FLUID 44X44 (DRAPES) ×1 IMPLANT
DRSG OPSITE POSTOP 4X10 (GAUZE/BANDAGES/DRESSINGS) IMPLANT
ELECT BLADE 6 FLAT ULTRCLN (ELECTRODE) IMPLANT
ELECT REM PT RETURN 9FT ADLT (ELECTROSURGICAL) ×1 IMPLANT
ELECTRODE REM PT RTRN 9FT ADLT (ELECTROSURGICAL) ×1 IMPLANT
GLOVE BIO SURGEON STRL SZ 6.5 (GLOVE) ×1 IMPLANT
GLOVE BIO SURGEON STRL SZ7 (GLOVE) IMPLANT
GLOVE BIOGEL PI IND STRL 6.5 (GLOVE) ×1 IMPLANT
GLOVE BIOGEL PI IND STRL 7.0 (GLOVE) ×2 IMPLANT
GLOVE ECLIPSE 6.5 STRL STRAW (GLOVE) IMPLANT
GOWN STRL REUS W/TWL LRG LVL3 (GOWN DISPOSABLE) ×3 IMPLANT
HANDLE SUCTION POOLE (INSTRUMENTS) ×1 IMPLANT
INST SET MAJOR GENERAL (KITS) ×1 IMPLANT
KIT TURNOVER KIT A (KITS) ×1 IMPLANT
LIGASURE IMPACT 36 18CM CVD LR (INSTRUMENTS) ×1 IMPLANT
MANIFOLD NEPTUNE II (INSTRUMENTS) ×1 IMPLANT
NS IRRIG 1000ML POUR BTL (IV SOLUTION) ×2 IMPLANT
NS IRRIG 500ML POUR BTL (IV SOLUTION) IMPLANT
PAD ARMBOARD 7.5X6 YLW CONV (MISCELLANEOUS) ×1 IMPLANT
PENCIL HANDSWITCHING (ELECTRODE) IMPLANT
RELOAD PROXIMATE 75MM BLUE (ENDOMECHANICALS) ×2 IMPLANT
RELOAD STAPLE 75 3.8 BLU REG (ENDOMECHANICALS) IMPLANT
RETRACTOR WND ALEXIS-O 25 LRG (MISCELLANEOUS) IMPLANT
RTRCTR WOUND ALEXIS O 25CM LRG (MISCELLANEOUS) ×1 IMPLANT
SET BASIN LINEN APH (SET/KITS/TRAYS/PACK) ×1 IMPLANT
SPONGE T-LAP 18X18 ~~LOC~~+RFID (SPONGE) ×1 IMPLANT
STAPLER GUN LINEAR PROX 60 (STAPLE) ×1 IMPLANT
STAPLER PROXIMATE 75MM BLUE (STAPLE) IMPLANT
STAPLER VISISTAT (STAPLE) ×1 IMPLANT
SUCTION POOLE HANDLE (INSTRUMENTS) ×1 IMPLANT
SUT CHROMIC 2 0 SH (SUTURE) IMPLANT
SUT PDS AB CT VIOLET #0 27IN (SUTURE) ×2 IMPLANT
SUT SILK 3 0 SH CR/8 (SUTURE) ×1 IMPLANT
YANKAUER SUCT BULB TIP 10FT TU (MISCELLANEOUS) ×1 IMPLANT

## 2023-03-23 NOTE — Interval H&P Note (Signed)
History and Physical Interval Note:  03/23/2023 7:25 AM  Dawn Estrada  has presented today for surgery, with the diagnosis of small bowel mass and small bowel obstruction.  The various methods of treatment have been discussed with the patient and family. After consideration of risks, benefits and other options for treatment, the patient has consented to  Procedure(s): SMALL BOWEL RESECTION (N/A) as a surgical intervention.  The patient's history has been reviewed, patient examined, no change in status, stable for surgery.  I have reviewed the patient's chart and labs.  Questions were answered to the patient's satisfaction.    No questions from patient [name]

## 2023-03-23 NOTE — Progress Notes (Signed)
PHARMACY - TOTAL PARENTERAL NUTRITION CONSULT NOTE   Indication: Small bowel obstruction  Patient Measurements: Height: 5\' 6"  (167.6 cm) Weight: 59 kg (130 lb 1.1 oz) IBW/kg (Calculated) : 59.3 TPN AdjBW (KG): 57.8 Body mass index is 20.99 kg/m.   Assessment:  Patient with a small bowel mass and obstruction. Needs surgery but is now COVID positive. TPN recommended for nutrition for now with SBO which is slowly resolving but also with SB mass for surgery this morning. Will continue TPN for now  Glucose / Insulin: 161-173- 6 units insulin given  Labs from 01/19/24 Electrolytes: K 3.4 Phos 3.0 Renal: Scr 0.67 Hepatic: WNL Albumin 2.6 GI Imaging/procedures: SBR and partial colectomy  Central access: PICC TPN start date: 2/10  Nutritional Goals: Goal TPN rate is 65 mL/hr (provides 81 g of protein and 1432 kcals per day)  RD Assessment: Estimated Needs Total Energy Estimated Needs: 1450-1650 Total Protein Estimated Needs: 75-85 gm Total Fluid Estimated Needs: >/= 1.5 L  Current Nutrition:  TPN  Plan:  Continue TPN at 65 mL/hr at 1800 Electrolytes in TPN: Na 25mEq/L, K 36mEq/L, Ca 91mEq/L, Mg 76mEq/L, and Phos 13mmol/L. Cl:Ac 1:1 Add standard MVI and trace elements to TPN Continue Sensitive q6h SSI and adjust as needed -if CBGs continue to trend up will adjust  Recheck labs in am then monitor TPN labs on Mon/Thurs  Sheppard Coil PharmD., BCPS Clinical Pharmacist 03/23/2023 8:28 AM

## 2023-03-23 NOTE — Anesthesia Procedure Notes (Signed)
Procedure Name: Intubation Date/Time: 03/23/2023 7:52 AM  Performed by: Cy Blamer, CRNAPre-anesthesia Checklist: Patient identified, Emergency Drugs available, Suction available and Patient being monitored Patient Re-evaluated:Patient Re-evaluated prior to induction Oxygen Delivery Method: Circle system utilized Preoxygenation: Pre-oxygenation with 100% oxygen Induction Type: IV induction and Rapid sequence Laryngoscope Size: Miller and 2 Grade View: Grade II Tube type: Oral Tube size: 7.0 mm Number of attempts: 1 Airway Equipment and Method: Stylet Placement Confirmation: ETT inserted through vocal cords under direct vision, positive ETCO2 and breath sounds checked- equal and bilateral Secured at: 21 cm Tube secured with: Tape Dental Injury: Teeth and Oropharynx as per pre-operative assessment

## 2023-03-23 NOTE — OR Nursing (Signed)
Patient's son [name] updated per Dr. Henreitta Leber, regarding progress of surgery.

## 2023-03-23 NOTE — Progress Notes (Signed)
PROGRESS NOTE   Dawn Estrada  MVH:[id number] DOB: May 04, 1919 DOA: 03/16/2023 PCP: Kirstie Peri, MD   Chief Complaint  Patient presents with   Emesis   Level of care: Med-Surg  Brief Admission History:   89 y.o. female with medical history significant of essential hypertension, hiatal hernia, hemorrhoids who presents to the emergency department due to several months onset of intermittent abdominal pain which worsened last night.  Patient was admitted for small bowel obstruction.  General surgery consulted with plans for small bowel resection and possible partial colectomy in a.m.  EKG and 2D echocardiogram ordered for preop evaluation pending.  She had fever and further evaluation reveals COVID infection and she has been started on remdesivir.  Fevers are improving and she has now had PICC line placement as well as initiation of TPN on 2/10 and is awaiting possible surgical intervention on 2/14.    Assessment and Plan:  Small bowel obstruction - resolved  CT abdomen and pelvis with contrast showed small bowel obstruction with transition point in the distal ileum where there is an enhancing lesion measuring 1.9 x 0.9 cm. Findings are worrisome for neoplasm. Appreciate general surgery ongoing evaluation, plan for SBR on 2/14 if clinically stable EKG and 2D echocardiogram WNL with preserved EF PICC line and TPN placed for nutrition on 2/10 Pt remains on TPN and tolerating so far CT entero ordered by surgery 2/12  POD s/p right hemicolectomy with additional ileum - follow postop orders per surgery team   COVID infection completed remdesivir 2/12.  No significant hypoxemia or findings on chest x-ray Continue ongoing treatment and airborne precautions   Hyponatremia mild/hypokalemia Na 129, no known cause at this time, though may be due to medication effect considering that patient is on HCTZ Continue to monitor while on TPN Recheck in a.m. with magnesium    Hypoalbuminemia Albumin  3.3, consider protein supplement when patient resumes oral intake Currently on TPN for nutrition   Essential hypertension Continue IV hydralazine 10 mg every 6 hours as needed for SBP > 170 Consider oral meds when patient resumes oral intake   Hiatal hernia GERD Continue Protonix   Hemorrhoids Continue Preparation H  DVT prophylaxis: SCDs Code Status: Full  Family Communication: long conversation with son Maurine Minister with Dr. Henreitta Leber 2/13, son bedside 2/14 Disposition: TBD    Consultants:  Surgery   Procedures:   Antimicrobials:    Subjective: Pt seen right after surgery today.   Objective: Vitals:   03/23/23 1045 03/23/23 1100 03/23/23 1115 03/23/23 1146  BP: (!) 157/64 (!) 163/69 (!) 150/64 (!) 159/69  Pulse: 98 92 95 100  Resp:    17  Temp:  98.2 F (36.8 C)  98.2 F (36.8 C)  TempSrc:    Oral  SpO2: 97% 96% 96% 95%  Weight:      Height:        Intake/Output Summary (Last 24 hours) at 03/23/2023 1230 Last data filed at 03/23/2023 0959 Gross per 24 hour  Intake 760 ml  Output 50 ml  Net 710 ml   Filed Weights   03/22/23 0500 03/22/23 2036 03/23/23 0508  Weight: 57.8 kg 57.8 kg 59 kg   Examination:  General exam: Appears calm and comfortable, resting postoperatively, somnolent but arousable.   Respiratory system: Clear to auscultation. Respiratory effort normal. Cardiovascular system: normal S1 & S2 heard. No JVD, murmurs, rubs, gallops or clicks. No pedal edema. Gastrointestinal system: Abdomen wounds C/D/I.  Hypoactive bowel sounds heard. Central nervous system: somnolent.  No focal neurological deficits. Extremities: Symmetric 5 x 5 power. Skin: No rashes, lesions or ulcers. Psychiatry: Judgement and insight appear UTD. Mood & affect UTD.   Data Reviewed: I have personally reviewed following labs and imaging studies  CBC: Recent Labs  Lab 03/16/23 1448 03/17/23 0321 03/19/23 0555 03/20/23 0346 03/21/23 0438  WBC 7.5 7.6 5.7 4.4 4.0  HGB 12.6  12.0 10.9* 10.5* 10.8*  HCT 38.2 36.7 32.9* 32.9* 32.9*  MCV 90.7 90.0 91.1 91.6 91.4  PLT 202 195 172 165 147*    Basic Metabolic Panel: Recent Labs  Lab 03/17/23 0321 03/18/23 0423 03/19/23 0233 03/20/23 0346 03/21/23 0438 03/22/23 0412  NA 131* 135 137 134* 136 134*  K 3.4* 3.4* 4.0 3.3* 3.4* 3.4*  CL 99 105 108 106 106 107  CO2 22 23 21* 22 22 20*  GLUCOSE 100* 114* 94 113* 116* 149*  BUN 12 7* <5* 9 14 18   CREATININE 0.77 0.79 0.65 0.77 0.67 0.67  CALCIUM 8.3* 8.1* 8.4* 8.0* 8.0* 8.0*  MG 1.5* 1.8 1.6* 2.1 1.8 1.7  PHOS 2.6  --   --  1.9* 2.3* 3.0    CBG: Recent Labs  Lab 03/22/23 1332 03/22/23 1640 03/23/23 0030 03/23/23 0509 03/23/23 1135  GLUCAP 161* 143* 173* 146* 193*    Recent Results (from the past 240 hours)  Resp panel by RT-PCR (RSV, Flu A&B, Covid) Anterior Nasal Swab     Status: Abnormal   Collection Time: 03/19/23  9:30 AM   Specimen: Anterior Nasal Swab  Result Value Ref Range Status   SARS Coronavirus 2 by RT PCR POSITIVE (A) NEGATIVE Final    Comment: (NOTE) SARS-CoV-2 target nucleic acids are DETECTED.  The SARS-CoV-2 RNA is generally detectable in upper respiratory specimens during the acute phase of infection. Positive results are indicative of the presence of the identified virus, but do not rule out bacterial infection or co-infection with other pathogens not detected by the test. Clinical correlation with patient history and other diagnostic information is necessary to determine patient infection status. The expected result is Negative.  Fact Sheet for Patients: BloggerCourse.com  Fact Sheet for Healthcare Providers: SeriousBroker.it  This test is not yet approved or cleared by the [place] FDA and  has been authorized for detection and/or diagnosis of SARS-CoV-2 by FDA under an Emergency Use Authorization (EUA).  This EUA will remain in effect (meaning this test can be  used) for the duration of  the COVID-19 declaration under Section 564(b)(1) of the A ct, 21 U.S.C. section 360bbb-3(b)(1), unless the authorization is terminated or revoked sooner.     Influenza A by PCR NEGATIVE NEGATIVE Final   Influenza B by PCR NEGATIVE NEGATIVE Final    Comment: (NOTE) The Xpert Xpress SARS-CoV-2/FLU/RSV plus assay is intended as an aid in the diagnosis of influenza from Nasopharyngeal swab specimens and should not be used as a sole basis for treatment. Nasal washings and aspirates are unacceptable for Xpert Xpress SARS-CoV-2/FLU/RSV testing.  Fact Sheet for Patients: BloggerCourse.com  Fact Sheet for Healthcare Providers: SeriousBroker.it  This test is not yet approved or cleared by the [place] FDA and has been authorized for detection and/or diagnosis of SARS-CoV-2 by FDA under an Emergency Use Authorization (EUA). This EUA will remain in effect (meaning this test can be used) for the duration of the COVID-19 declaration under Section 564(b)(1) of the Act, 21 U.S.C. section 360bbb-3(b)(1), unless the authorization is terminated or revoked.     Resp Syncytial  Virus by PCR NEGATIVE NEGATIVE Final    Comment: (NOTE) Fact Sheet for Patients: BloggerCourse.com  Fact Sheet for Healthcare Providers: SeriousBroker.it  This test is not yet approved or cleared by the [place] FDA and has been authorized for detection and/or diagnosis of SARS-CoV-2 by FDA under an Emergency Use Authorization (EUA). This EUA will remain in effect (meaning this test can be used) for the duration of the COVID-19 declaration under Section 564(b)(1) of the Act, 21 U.S.C. section 360bbb-3(b)(1), unless the authorization is terminated or revoked.  Performed at [facility], [address], [place], [place] [address]   Culture, blood (Routine X 2) w Reflex to ID Panel      Status: None (Preliminary result)   Collection Time: 03/19/23  9:47 AM   Specimen: BLOOD  Result Value Ref Range Status   Specimen Description BLOOD BLOOD LEFT FOREARM  Final   Special Requests   Final    BOTTLES DRAWN AEROBIC ONLY Blood Culture adequate volume   Culture   Final    NO GROWTH 4 DAYS Performed at [facility], [address] [place], [place] [address]    Report Status PENDING  Incomplete  Culture, blood (Routine X 2) w Reflex to ID Panel     Status: None (Preliminary result)   Collection Time: 03/19/23  9:52 AM   Specimen: BLOOD  Result Value Ref Range Status   Specimen Description BLOOD BLOOD LEFT HAND  Final   Special Requests   Final    BOTTLES DRAWN AEROBIC ONLY Blood Culture adequate volume   Culture   Final    NO GROWTH 4 DAYS Performed at [facility], [address], [place], [place] [address]    Report Status PENDING  Incomplete     Radiology Studies: CT ENTERO ABD/PELVIS W CONTAST Result Date: 03/21/2023 CLINICAL DATA:  Small bowel mass, preop. EXAM: CT ABDOMEN AND PELVIS WITH CONTRAST (ENTEROGRAPHY) TECHNIQUE: Multidetector CT of the abdomen and pelvis during bolus administration of intravenous contrast. Negative oral contrast was given. RADIATION DOSE REDUCTION: This exam was performed according to the departmental dose-optimization program which includes automated exposure control, adjustment of the mA and/or kV according to patient size and/or use of iterative reconstruction technique. CONTRAST:  OMNIPAQUE IOHEXOL 300 MG/ML  SOLN COMPARISON:  03/16/2023. FINDINGS: Lower chest: Trace left pleural fluid. Scarring in the lingula. Heart is at the upper limits of normal in size to mildly enlarged. No pericardial effusion. Moderate hiatal hernia. Hepatobiliary: Hepatic cysts. No specific follow-up necessary. Cholecystectomy. No unexpected biliary ductal dilatation. Pancreas: Negative. Spleen: Negative. Adrenals/Urinary Tract: Adrenal glands are unremarkable.  Bilateral renal cysts. No specific follow-up necessary. Ureters are decompressed. Bladder is grossly unremarkable. Stomach/Bowel: Moderate hiatal hernia. Enhancing lesions seen in the distal small bowel on 03/16/2023 is not as well visualized. There may be a sessile lesion in the distal small bowel measuring 0.8 x 2.1 cm (3/138). Small bowel and colon are otherwise unremarkable. Appendix is not readily visualized. Vascular/Lymphatic: Atherosclerotic calcification of the aorta. No pathologically enlarged lymph nodes. Reproductive: Hysterectomy.  No adnexal mass. Other: Minimal presacral edema. No free fluid. Mesenteries and peritoneum are unremarkable. Musculoskeletal: Degenerative changes in the spine. IMPRESSION: 1. Possible sessile lesion in the distal small bowel to correspond to the questioned lesion on 03/16/2023. 2. Trace left pleural fluid. 3. Moderate hiatal hernia. 4.  Aortic atherosclerosis (ICD10-I70.0). Electronically Signed   By: Leanna Battles M.D.   On: 03/21/2023 17:33    Scheduled Meds:  ALPRAZolam  0.5-1 mg  Oral BID   Chlorhexidine Gluconate Cloth  6 each Topical Daily   hydrocortisone cream   Topical BID   insulin aspart  0-9 Units Subcutaneous Q6H   metoprolol tartrate  50 mg Oral BID   pantoprazole (PROTONIX) IV  40 mg Intravenous Q24H   sodium chloride flush  10-40 mL Intracatheter Q12H   Continuous Infusions:  TPN ADULT (ION) 65 mL/hr at 03/22/23 1806   TPN ADULT (ION)       LOS: 7 days   Time spent: 53 mins  Rozlynn Lippold Laural Benes, MD How to contact the [facility] Attending or Consulting provider 7A - 7P or covering provider during after hours 7P -7A, for this patient?  Check the care team in [facility] and look for a) attending/consulting [facility] provider listed and b) the [facility] team listed Log into [email or web address] to find provider on call.  Locate the [facility] provider you are looking for under [facility] and page to a number that you can be directly reached. If you still have difficulty  reaching the provider, please page the [facility] (Director on Call) for the Hospitalists listed on amion for assistance.  03/23/2023, 12:30 PM

## 2023-03-23 NOTE — Anesthesia Postprocedure Evaluation (Signed)
Anesthesia Post Note  Patient: Dawn Estrada  Procedure(s) Performed: SMALL BOWEL RESECTION AND RIGHT HEMI-COLECTOMY (Abdomen)  Patient location during evaluation: PACU Anesthesia Type: General Level of consciousness: awake and alert Pain management: pain level controlled Vital Signs Assessment: post-procedure vital signs reviewed and stable Respiratory status: spontaneous breathing, nonlabored ventilation, respiratory function stable and patient connected to nasal cannula oxygen Cardiovascular status: blood pressure returned to baseline and stable Postop Assessment: no apparent nausea or vomiting Anesthetic complications: no   There were no known notable events for this encounter.   Last Vitals:  Vitals:   03/23/23 1115 03/23/23 1146  BP: (!) 150/64 (!) 159/69  Pulse: 95 100  Resp:  17  Temp:  36.8 C  SpO2: 96% 95%    Last Pain:  Vitals:   03/23/23 1146  TempSrc: Oral  PainSc:                  [name]

## 2023-03-23 NOTE — Op Note (Signed)
[facility] Operative Note  03/23/23  Preoperative Diagnosis: Small bowel mass, small bowel obstruction   Postoperative Diagnosis: Same   Procedure(s) Performed: Right hemicolectomy with additional ileum    Surgeon: Leatrice Jewels. Henreitta Leber, MD   Assistants: No qualified resident was available    Anesthesia: General endotracheal   Anesthesiologist: [name], MD    Specimens: Right colon and small bowel    Estimated Blood Loss: Minimal   Blood Replacement: None    Complications: None   Wound Class: Clean contaminated    Operative Indications: Ms. Momon is a 89 year old who is healthy for her age and came in with a SBO from a small bowel mass. She unfortunately tested positive for COVID and was having high fevers earlier in the week and we post poned surgery to ensure she was healthy enough to proceed. She was started on TPN and a repeat CT demonstrated a concern for a sessile mass in the small bowel. I discussed surgery with her and her sons and risk of bleeding, infection, anastomotic leak, injury to other organs, ureter, need for right colectomy. Prior to surgery her fevers had resolved, she was on antiviral, required no Oxygen during this time and had good breath sounds and no concerns for worsening respiratory issues. A ECHO was done that demonstrated good function.   Findings: Small mass in a matted segment of ileum    Procedure: The patient was taken to the operating room and placed supine. General endotracheal anesthesia was induced. Intravenous antibiotics were administered per protocol.  A nasogastric tube positioned to decompress the stomach. The abdomen was prepped and draped in the usual sterile fashion.   A midline incision was made and carried down to the fascia. With care using scissors, I entered the peritoneum and noted adhesion of omentum and small bowel to the anterior abdominal wall. With care, these adhesions were taken down with  scissors. A wound protector was placed. There were adhesion to the sigmoid colon and a matted section of ileum that were lysed with scissors. I found the matted segment of small bowel and palpated a mass that was also associated with a narrowed/ almost kinked segment of bowel.  This matted area of small bowel was <10cm to the ileocecal valve so the right colon had to be removed. I located a section on the small bowel proximal to the matted section. I ran the small bowel back and she had over 250cm of small bowel remaining from this point. The small bowel was transected In the standard fashion using a 75 mm linear cutting stapler. The transverse colon was identified and elevated and the hepatic flexure was taken down with the ligasure. The point of distal transection was taken with a 75 mm linear cutting stapler.  The white line was taken down with cautery. The right colon was rolled medially, and  the mesentery was taken close to the bowel to prevent any retroperitoneal injuries. The ureter and duodenum were identified and protected.   The specimen was passed off. The two ends were brought together without any twisting of the small bowel and the anastomosis was done in the standard fashion with a linear cutting 75 mm stapler. The common colotomy was then closed with a TA 60. The staple line was oversewn with 3-0 Silk Lembert suture. Two crotch sutures were place with a 3-0 Silk suture. The mesenteric defect was closed with 3-0 Chromic gut.   Irrigation was performed. Hemostasis was achieved. The NG was  confirmed in the stomach.  The wound protector was removed. The entire  team changed gown and gloves and new closing tray and drapes were used in the standard fashion for closing.   The fascia was closed with 0 PDS in the standard fashion. The subcutaneous tissue was irrigated. Marcaine was injected. Staples were used to close the skin. A honeycomb was placed over the incision.   Final inspection revealed  acceptable hemostasis. All counts were correct at the end of the case. The patient was awakened from anesthesia and extubate without complication.  The patient recovered in the OR due to her COVID positive status and returned to her room in stable condition.   Algis Greenhouse, MD [facility] [address] [place] [place], [place] [address] [phone] (office)

## 2023-03-23 NOTE — Progress Notes (Signed)
Has received fentanyl 12.5 twice since surgery.  Is drowsy but can answer and follow commands.  Purwick draining clear yellow urine and ng tube has drainage in tube but not in cannister.  Vitals just now, temp 100.  Son at bedside and one of the sons will spend the night

## 2023-03-23 NOTE — Anesthesia Preprocedure Evaluation (Signed)
Anesthesia Evaluation  Patient identified by MRN, date of birth, ID band Patient awake    Reviewed: Allergy & Precautions, H&P , NPO status , Patient's Chart, lab work & pertinent test results  Airway Mallampati: II  TM Distance: >3 FB Neck ROM: Full    Dental  (+) Edentulous Lower, Edentulous Upper   Pulmonary pneumonia   Pulmonary exam normal breath sounds clear to auscultation       Cardiovascular hypertension, Normal cardiovascular exam Rhythm:Regular Rate:Normal     Neuro/Psych   Anxiety      Neuromuscular disease  negative psych ROS   GI/Hepatic Neg liver ROS, hiatal hernia,GERD  ,,  Endo/Other  negative endocrine ROS    Renal/GU negative Renal ROS  negative genitourinary   Musculoskeletal  (+) Arthritis ,    Abdominal   Peds negative pediatric ROS (+)  Hematology negative hematology ROS (+)   Anesthesia Other Findings Covid + on this admission  Reproductive/Obstetrics negative OB ROS                             Anesthesia Physical Anesthesia Plan  ASA: 3  Anesthesia Plan: General   Post-op Pain Management:    Induction: Intravenous  PONV Risk Score and Plan:   Airway Management Planned: Oral ETT  Additional Equipment:   Intra-op Plan:   Post-operative Plan: Extubation in OR  Informed Consent: I have reviewed the patients History and Physical, chart, labs and discussed the procedure including the risks, benefits and alternatives for the proposed anesthesia with the patient or authorized representative who has indicated his/her understanding and acceptance.       Plan Discussed with: CRNA  Anesthesia Plan Comments:        Anesthesia Quick Evaluation

## 2023-03-23 NOTE — Transfer of Care (Signed)
Immediate Anesthesia Transfer of Care Note  Patient: Dawn Estrada  Procedure(s) Performed: SMALL BOWEL RESECTION AND RIGHT HEMI-COLECTOMY (Abdomen)  Patient Location: PACU  Anesthesia Type:General  Level of Consciousness: awake, alert , and patient cooperative  Airway & Oxygen Therapy: Patient Spontanous Breathing and Patient connected to face mask oxygen  Post-op Assessment: Report given to RN, Post -op Vital signs reviewed and stable, Patient moving all extremities X 4, and Patient able to stick tongue midline  Post vital signs: Reviewed and stable  Last Vitals:  Vitals Value Taken Time  BP 170/70   Temp 97.8   Pulse 95   Resp 15   SpO2 100     Last Pain:  Vitals:   03/23/23 0659  TempSrc:   PainSc: 0-No pain      Patients Stated Pain Goal: 2 (03/19/23 0927)  Complications: There were no known notable events for this encounter.

## 2023-03-23 NOTE — Plan of Care (Signed)
This pt is prepared and ready for surgery. All pre-surgery protocol has been followed by this nurse. Pts son is at bedside.   Problem: Health Behavior/Discharge Planning: Goal: Ability to manage health-related needs will improve Outcome: Progressing   Problem: Clinical Measurements: Goal: Ability to maintain clinical measurements within normal limits will improve Outcome: Progressing Goal: Will remain free from infection Outcome: Progressing Goal: Diagnostic test results will improve Outcome: Progressing   Problem: Elimination: Goal: Will not experience complications related to urinary retention Outcome: Progressing   Problem: Safety: Goal: Ability to remain free from injury will improve Outcome: Progressing

## 2023-03-23 NOTE — Progress Notes (Signed)
From pacu around 1200.  Drowsy, vitals stable on room air.  Abd honeycomb dressing dry and intact.  Abd binder, telemetry, scd's applied and ng tube connected to LIS.   Xray in room to confirm ng placement.  Stated had to urinate and voided good amount in bedpan.  Son at bedside.

## 2023-03-23 NOTE — Progress Notes (Signed)
[facility]  Updated family and team. Due to proximity of small bowel mass to the right colon, right hemicolectomy done in conjunction with removing the small bowel.   PRN for pain IS, OOB Telemetry Npo, NG CXR to confirm NG location TPN Labs in AM Scds, can likely start heparin tomorrow  if H&H stable   Algis Greenhouse, MD [facility] [address] [place], [place] [address] [phone] (office)

## 2023-03-24 ENCOUNTER — Encounter (HOSPITAL_COMMUNITY): Payer: Self-pay | Admitting: General Surgery

## 2023-03-24 DIAGNOSIS — K219 Gastro-esophageal reflux disease without esophagitis: Secondary | ICD-10-CM | POA: Diagnosis not present

## 2023-03-24 DIAGNOSIS — K56609 Unspecified intestinal obstruction, unspecified as to partial versus complete obstruction: Secondary | ICD-10-CM | POA: Diagnosis not present

## 2023-03-24 DIAGNOSIS — I10 Essential (primary) hypertension: Secondary | ICD-10-CM | POA: Diagnosis not present

## 2023-03-24 DIAGNOSIS — E876 Hypokalemia: Secondary | ICD-10-CM | POA: Diagnosis not present

## 2023-03-24 LAB — BASIC METABOLIC PANEL
Anion gap: 5 (ref 5–15)
BUN: 22 mg/dL (ref 8–23)
CO2: 19 mmol/L — ABNORMAL LOW (ref 22–32)
Calcium: 8.3 mg/dL — ABNORMAL LOW (ref 8.9–10.3)
Chloride: 108 mmol/L (ref 98–111)
Creatinine, Ser: 0.69 mg/dL (ref 0.44–1.00)
GFR, Estimated: >60 mL/min (ref >60)
Glucose, Bld: 252 mg/dL — ABNORMAL HIGH (ref 70–99)
Potassium: 4.7 mmol/L (ref 3.5–5.1)
Sodium: 132 mmol/L — ABNORMAL LOW (ref 135–145)

## 2023-03-24 LAB — CBC WITH DIFFERENTIAL/PLATELET
Abs Immature Granulocytes: 0.06 10*3/uL (ref 0.00–0.07)
Basophils Absolute: 0 10*3/uL (ref 0.0–0.1)
Basophils Relative: 0 %
Eosinophils Absolute: 0.1 10*3/uL (ref 0.0–0.5)
Eosinophils Relative: 1 %
HCT: 35.4 % — ABNORMAL LOW (ref 36.0–46.0)
Hemoglobin: 11.3 g/dL — ABNORMAL LOW (ref 12.0–15.0)
Immature Granulocytes: 1 %
Lymphocytes Relative: 12 %
Lymphs Abs: 1 10*3/uL (ref 0.7–4.0)
MCH: 29 pg (ref 26.0–34.0)
MCHC: 31.9 g/dL (ref 30.0–36.0)
MCV: 91 fL (ref 80.0–100.0)
Monocytes Absolute: 0.9 10*3/uL (ref 0.1–1.0)
Monocytes Relative: 11 %
Neutro Abs: 6.3 10*3/uL (ref 1.7–7.7)
Neutrophils Relative %: 75 %
Platelets: 164 10*3/uL (ref 150–400)
RBC: 3.89 MIL/uL (ref 3.87–5.11)
RDW: 13.3 % (ref 11.5–15.5)
WBC: 8.4 10*3/uL (ref 4.0–10.5)
nRBC: 0 % (ref 0.0–0.2)

## 2023-03-24 LAB — GLUCOSE, CAPILLARY
Glucose-Capillary: 215 mg/dL — ABNORMAL HIGH (ref 70–99)
Glucose-Capillary: 228 mg/dL — ABNORMAL HIGH (ref 70–99)
Glucose-Capillary: 249 mg/dL — ABNORMAL HIGH (ref 70–99)
Glucose-Capillary: 249 mg/dL — ABNORMAL HIGH (ref 70–99)
Glucose-Capillary: 266 mg/dL — ABNORMAL HIGH (ref 70–99)

## 2023-03-24 LAB — CULTURE, BLOOD (ROUTINE X 2)
Culture: NO GROWTH
Culture: NO GROWTH
Special Requests: ADEQUATE
Special Requests: ADEQUATE

## 2023-03-24 LAB — PHOSPHORUS: Phosphorus: 2 mg/dL — ABNORMAL LOW (ref 2.5–4.6)

## 2023-03-24 LAB — MAGNESIUM: Magnesium: 1.9 mg/dL (ref 1.7–2.4)

## 2023-03-24 MED ORDER — ENOXAPARIN SODIUM 30 MG/0.3ML IJ SOSY
30.0000 mg | PREFILLED_SYRINGE | INTRAMUSCULAR | Status: DC
  Administered 2023-03-24 – 2023-03-28 (×5): 30 mg via SUBCUTANEOUS
  Filled 2023-03-24 (×5): qty 0.3

## 2023-03-24 MED ORDER — METOPROLOL TARTRATE 5 MG/5ML IV SOLN
7.5000 mg | Freq: Four times a day (QID) | INTRAVENOUS | Status: AC
  Administered 2023-03-24 – 2023-03-27 (×11): 7.5 mg via INTRAVENOUS
  Filled 2023-03-24 (×12): qty 10

## 2023-03-24 MED ORDER — TRAVASOL 10 % IV SOLN
INTRAVENOUS | Status: AC
Start: 2023-03-24 — End: 2023-03-25
  Filled 2023-03-24: qty 811.2

## 2023-03-24 MED ORDER — MAGNESIUM SULFATE 2 GM/50ML IV SOLN
2.0000 g | Freq: Once | INTRAVENOUS | Status: AC
  Administered 2023-03-24: 2 g via INTRAVENOUS
  Filled 2023-03-24: qty 50

## 2023-03-24 MED ORDER — INSULIN ASPART 100 UNIT/ML IJ SOLN
0.0000 [IU] | Freq: Four times a day (QID) | INTRAMUSCULAR | Status: DC
  Administered 2023-03-24: 8 [IU] via SUBCUTANEOUS
  Administered 2023-03-24 (×2): 5 [IU] via SUBCUTANEOUS
  Administered 2023-03-25: 8 [IU] via SUBCUTANEOUS
  Administered 2023-03-25: 5 [IU] via SUBCUTANEOUS

## 2023-03-24 MED ORDER — SODIUM PHOSPHATES 45 MMOLE/15ML IV SOLN
30.0000 mmol | Freq: Once | INTRAVENOUS | Status: AC
  Administered 2023-03-24: 30 mmol via INTRAVENOUS
  Filled 2023-03-24: qty 10

## 2023-03-24 NOTE — Plan of Care (Signed)
   Problem: Health Behavior/Discharge Planning: Goal: Ability to manage health-related needs will improve Outcome: Progressing   Problem: Clinical Measurements: Goal: Ability to maintain clinical measurements within normal limits will improve Outcome: Progressing Goal: Will remain free from infection Outcome: Progressing   Problem: Safety: Goal: Ability to remain free from injury will improve Outcome: Progressing

## 2023-03-24 NOTE — Progress Notes (Addendum)
1 Day Post-Op  Subjective: Patient resting comfortably in the bed.  No significant incisional pain.  Objective: Vital signs in last 24 hours: Temp:  [97.6 F (36.4 C)-100 F (37.8 C)] 97.6 F (36.4 C) (02/15 0455) Pulse Rate:  [81-110] 109 (02/15 0455) Resp:  [16-20] 18 (02/14 2004) BP: (138-170)/(57-86) 138/57 (02/15 0455) SpO2:  [95 %-100 %] 96 % (02/15 0455) Weight:  [59.6 kg] 59.6 kg (02/15 0500) Last BM Date : 03/21/23  Intake/Output from previous day: 02/14 0701 - 02/15 0700 In: 1545.8 [I.V.:1195.8; IV Piggyback:350] Out: 500 [Urine:450; Blood:50] Intake/Output this shift: No intake/output data recorded.  General appearance: alert, cooperative, and no distress GI: Soft, incision healing well.  No bowel sounds appreciated.  Lab Results:  Recent Labs    03/24/23 0420  WBC 8.4  HGB 11.3*  HCT 35.4*  PLT 164   BMET Recent Labs    03/22/23 0412 03/24/23 0420  NA 134* 132*  K 3.4* 4.7  CL 107 108  CO2 20* 19*  GLUCOSE 149* 252*  BUN 18 22  CREATININE 0.67 0.69  CALCIUM 8.0* 8.3*   PT/INR No results for input(s): "LABPROT", "INR" in the last 72 hours.  Studies/Results: DG Chest Port 1 View Result Date: 03/23/2023 CLINICAL DATA:  Nasogastric tube placement. EXAM: PORTABLE CHEST 1 VIEW COMPARISON:  None Available. FINDINGS: Image focused on the lower chest and upper abdomen for enteric tube placement. Tip and side port of the enteric tube below the diaphragm in the region of the mid stomach. Skin staples in the upper abdomen partially included. IMPRESSION: Tip and side port of the enteric tube below the diaphragm in the region of the mid stomach. Electronically Signed   By: Narda Rutherford M.D.   On: 03/23/2023 15:13    Anti-infectives: Anti-infectives (From admission, onward)    Start     Dose/Rate Route Frequency Ordered Stop   03/23/23 0800  sodium chloride 0.9 % with cefoTEtan (CEFOTAN) ADS Med       Note to Pharmacy: Daphane Shepherd H: cabinet override       03/23/23 0800 03/23/23 0824   03/23/23 0600  cefoTEtan (CEFOTAN) 2 g in sodium chloride 0.9 % 100 mL IVPB        2 g 200 mL/hr over 30 Minutes Intravenous On call to O.R. 03/22/23 1340 03/23/23 0830   03/20/23 1000  remdesivir 100 mg in sodium chloride 0.9 % 100 mL IVPB  Status:  Discontinued       Placed in "Followed by" Linked Group   100 mg 200 mL/hr over 30 Minutes Intravenous Daily 03/19/23 1324 03/19/23 1348   03/20/23 1000  remdesivir 100 mg in sodium chloride 0.9 % 100 mL IVPB       Placed in "Followed by" Linked Group   100 mg 200 mL/hr over 30 Minutes Intravenous Daily 03/19/23 1348 03/21/23 1234   03/19/23 1445  remdesivir 100 mg in sodium chloride 0.9 % 100 mL IVPB       Placed in "Followed by" Linked Group   100 mg 200 mL/hr over 30 Minutes Intravenous Every 30 min 03/19/23 1348 03/19/23 1701   03/19/23 1415  remdesivir 200 mg in sodium chloride 0.9% 250 mL IVPB  Status:  Discontinued       Placed in "Followed by" Linked Group   200 mg 580 mL/hr over 30 Minutes Intravenous Once 03/19/23 1324 03/19/23 1348   03/19/23 0700  cefoTEtan (CEFOTAN) 2 g in sodium chloride 0.9 % 100 mL IVPB  2 g 200 mL/hr over 30 Minutes Intravenous On call to O.R. 03/18/23 1104 03/20/23 0559   03/18/23 1200  cefoTEtan (CEFOTAN) 2 g in sodium chloride 0.9 % 100 mL IVPB  Status:  Discontinued        2 g 200 mL/hr over 30 Minutes Intravenous On call to O.R. 03/18/23 1104 03/18/23 1104       Assessment/Plan: s/p Procedure(s): SMALL BOWEL RESECTION AND RIGHT HEMI-COLECTOMY Impression: Stable on postoperative day 1.  Hypophosphatemia noted and being addressed.  Awaiting return of bowel function.  Will leave NG tube in and reassess in the morning.  May get patient up to chair.  As hemoglobin is stable, may restart heparin drip.  Do not bolus.  LOS: 8 days    Franky Macho 03/24/2023

## 2023-03-24 NOTE — Progress Notes (Addendum)
Pt has been resting comfortably throughout the night. Rise and fall of chest visualized. Urine output with Purewick 300 mls. NG tube in place. No output has been produced but remains in tube. Pts son at bedside. Pt V/S reflect she is stable and progressing. Pt experienced did experienced some episodes of tach during night. Will continue to monitor pt for any changes or signs of discomfort for remainder of shift.

## 2023-03-24 NOTE — Progress Notes (Signed)
PHARMACY - TOTAL PARENTERAL NUTRITION CONSULT NOTE   Indication: Small bowel obstruction  Patient Measurements: Height: 5\' 6"  (167.6 cm) Weight: 59.6 kg (131 lb 6.3 oz) IBW/kg (Calculated) : 59.3 TPN AdjBW (KG): 57.8 Body mass index is 21.21 kg/m.   Assessment:  Patient with a small bowel mass and obstruction. Needs surgery but is now COVID positive. TPN recommended for nutrition for now with SBO which is slowly resolving but also with SB mass for surgery this morning. Will continue TPN for now  Glucose / Insulin: 193-249- 11 units insulin given  Electrolytes: K 4.7 Phos 2.0 Renal: Scr 0.69 Hepatic: WNL Albumin 2.6 GI Imaging/procedures: SBR and partial colectomy  Central access: PICC TPN start date: 2/10  Nutritional Goals: Goal TPN rate is 65 mL/hr (provides 81 g of protein and 1432 kcals per day)  RD Assessment: Estimated Needs Total Energy Estimated Needs: 1450-1650 Total Protein Estimated Needs: 75-85 gm Total Fluid Estimated Needs: >/= 1.5 L  Current Nutrition:  TPN  Plan:  Continue TPN at 65 mL/hr at 1800 Electrolytes in TPN: Na 24mEq/L, K 25mEq/L, Ca 59mEq/L, Mg 14mEq/L, and Phos 72mmol/L. Cl:Ac 1:1 Add standard MVI and trace elements to TPN Increase to Moderate q6h SSI and adjust as needed Recheck labs in am then monitor TPN labs on Mon/Thurs  Sheppard Coil PharmD., BCPS Clinical Pharmacist 03/24/2023 8:05 AM

## 2023-03-24 NOTE — Progress Notes (Addendum)
PROGRESS NOTE   Dawn Estrada  ION:[id number] DOB: [birth year] DOA: 03/16/2023 PCP: Kirstie Peri, MD   Chief Complaint  Patient presents with   Emesis   Level of care: Med-Surg  Brief Admission History:   89 y.o. female with medical history significant of essential hypertension, hiatal hernia, hemorrhoids who presents to the emergency department due to several months onset of intermittent abdominal pain which worsened last night.  Patient was admitted for small bowel obstruction.  General surgery consulted with plans for small bowel resection and possible partial colectomy in a.m.  EKG and 2D echocardiogram ordered for preop evaluation pending.  She had fever and further evaluation reveals COVID infection and she has been started on remdesivir.  Fevers are improving and she has now had PICC line placement as well as initiation of TPN on 2/10 and is awaiting possible surgical intervention on 2/14.    Assessment and Plan:  Small bowel obstruction - resolved  CT abdomen and pelvis with contrast showed small bowel obstruction with transition point in the distal ileum where there is an enhancing lesion measuring 1.9 x 0.9 cm. Findings are worrisome for neoplasm. Appreciate general surgery ongoing evaluation, plan for SBR on 2/14 if clinically stable EKG and 2D echocardiogram WNL with preserved EF PICC line and TPN placed for nutrition on 2/10 Pt remains on TPN and tolerating so far, continuing until bowel function returns  CT entero completed 2/12  POD s/p right hemicolectomy with additional ileum - follow postop orders per surgery team Focusing on pain and nausea control, NGT remains in place today, appreciate assistance from surgery team   COVID infection completed remdesivir 2/12  No significant hypoxemia or findings on chest x-ray Continue ongoing treatment and airborne precautions   Hyponatremia mild/hypokalemia Na 129, no known cause at this time, though may be due to medication  effect considering that patient is on HCTZ Continue to monitor while on TPN Recheck in a.m. with magnesium    Hypoalbuminemia Albumin 3.3, consider protein supplement when patient resumes oral intake Currently on TPN for nutrition  Hypophosphatemia -IV replacement ordered by pharmacy while managing TPN  -recheck testing ordered  Hyperglycemia from TPN -Increased SSI coverage continue frequent CBG monitoring -If persists, add basal insulin   Essential hypertension Continue IV hydralazine 10 mg every 6 hours as needed for SBP > 170 Consider oral meds when patient resumes oral intake For now continue IV lopressor and increasing to 7.5 mg today with hold parameters    Hiatal hernia GERD Continue Protonix for GI protection    Hemorrhoids Continue Preparation H  DVT prophylaxis: SCDs Code Status: Full  Family Communication: long conversation with son Maurine Minister with Dr. Henreitta Leber 2/13, son bedside 2/14, 2/15  Disposition: TBD    Consultants:  Surgery   Procedures:   Antimicrobials:    Subjective: Pt is having some nausea and having some pain after her procedure yesterday   Objective: Vitals:   03/23/23 1821 03/23/23 2004 03/24/23 0455 03/24/23 0500  BP: (!) 169/86 (!) 152/76 (!) 138/57   Pulse: (!) 104 (!) 110 (!) 109   Resp: 16 18    Temp: 100 F (37.8 C) 97.8 F (36.6 C) 97.6 F (36.4 C)   TempSrc: Oral Oral Oral   SpO2: 95% 97% 96%   Weight:    59.6 kg  Height:        Intake/Output Summary (Last 24 hours) at 03/24/2023 1133 Last data filed at 03/24/2023 0458 Gross per 24 hour  Intake 795.79 ml  Output 450 ml  Net 345.79 ml   Filed Weights   03/22/23 2036 03/23/23 0508 03/24/23 0500  Weight: 57.8 kg 59 kg 59.6 kg   Examination:  General exam: Appears calm and comfortable, resting postoperatively, somnolent but arousable.   Respiratory system: Clear to auscultation. Respiratory effort normal. Cardiovascular system: normal S1 & S2 heard. No JVD, murmurs,  rubs, gallops or clicks. No pedal edema. Gastrointestinal system: Abdomen wounds C/D/I.  Hypoactive bowel sounds heard. Central nervous system: somnolent. No focal neurological deficits. Extremities: Symmetric 5 x 5 power. Skin: No rashes, lesions or ulcers. Psychiatry: Judgement and insight appear UTD. Mood & affect UTD.   Data Reviewed: I have personally reviewed following labs and imaging studies  CBC: Recent Labs  Lab 03/19/23 0555 03/20/23 0346 03/21/23 0438 03/24/23 0420  WBC 5.7 4.4 4.0 8.4  NEUTROABS  --   --   --  6.3  HGB 10.9* 10.5* 10.8* 11.3*  HCT 32.9* 32.9* 32.9* 35.4*  MCV 91.1 91.6 91.4 91.0  PLT 172 165 147* 164    Basic Metabolic Panel: Recent Labs  Lab 03/19/23 0233 03/20/23 0346 03/21/23 0438 03/22/23 0412 03/24/23 0420  NA 137 134* 136 134* 132*  K 4.0 3.3* 3.4* 3.4* 4.7  CL 108 106 106 107 108  CO2 21* 22 22 20* 19*  GLUCOSE 94 113* 116* 149* 252*  BUN <5* 9 14 18 22   CREATININE 0.65 0.77 0.67 0.67 0.69  CALCIUM 8.4* 8.0* 8.0* 8.0* 8.3*  MG 1.6* 2.1 1.8 1.7 1.9  PHOS  --  1.9* 2.3* 3.0 2.0*    CBG: Recent Labs  Lab 03/23/23 1135 03/23/23 1809 03/23/23 2318 03/24/23 0652 03/24/23 0915  GLUCAP 193* 224* 241* 249* 228*    Recent Results (from the past 240 hours)  Resp panel by RT-PCR (RSV, Flu A&B, Covid) Anterior Nasal Swab     Status: Abnormal   Collection Time: 03/19/23  9:30 AM   Specimen: Anterior Nasal Swab  Result Value Ref Range Status   SARS Coronavirus 2 by RT PCR POSITIVE (A) NEGATIVE Final    Comment: (NOTE) SARS-CoV-2 target nucleic acids are DETECTED.  The SARS-CoV-2 RNA is generally detectable in upper respiratory specimens during the acute phase of infection. Positive results are indicative of the presence of the identified virus, but do not rule out bacterial infection or co-infection with other pathogens not detected by the test. Clinical correlation with patient history and other diagnostic information is  necessary to determine patient infection status. The expected result is Negative.  Fact Sheet for Patients: BloggerCourse.com  Fact Sheet for Healthcare Providers: SeriousBroker.it  This test is not yet approved or cleared by the [place] FDA and  has been authorized for detection and/or diagnosis of SARS-CoV-2 by FDA under an Emergency Use Authorization (EUA).  This EUA will remain in effect (meaning this test can be used) for the duration of  the COVID-19 declaration under Section 564(b)(1) of the A ct, 21 U.S.C. section 360bbb-3(b)(1), unless the authorization is terminated or revoked sooner.     Influenza A by PCR NEGATIVE NEGATIVE Final   Influenza B by PCR NEGATIVE NEGATIVE Final    Comment: (NOTE) The Xpert Xpress SARS-CoV-2/FLU/RSV plus assay is intended as an aid in the diagnosis of influenza from Nasopharyngeal swab specimens and should not be used as a sole basis for treatment. Nasal washings and aspirates are unacceptable for Xpert Xpress SARS-CoV-2/FLU/RSV testing.  Fact Sheet for Patients: BloggerCourse.com  Fact Sheet for Healthcare Providers: SeriousBroker.it  This test is not yet approved or cleared by the [place] and has been authorized for detection and/or diagnosis of SARS-CoV-2 by FDA under an Emergency Use Authorization (EUA). This EUA will remain in effect (meaning this test can be used) for the duration of the COVID-19 declaration under Section 564(b)(1) of the Act, 21 U.S.C. section 360bbb-3(b)(1), unless the authorization is terminated or revoked.     Resp Syncytial Virus by PCR NEGATIVE NEGATIVE Final    Comment: (NOTE) Fact Sheet for Patients: BloggerCourse.com  Fact Sheet for Healthcare Providers: SeriousBroker.it  This test is not yet approved or cleared by the [place] FDA  and has been authorized for detection and/or diagnosis of SARS-CoV-2 by FDA under an Emergency Use Authorization (EUA). This EUA will remain in effect (meaning this test can be used) for the duration of the COVID-19 declaration under Section 564(b)(1) of the Act, 21 U.S.C. section 360bbb-3(b)(1), unless the authorization is terminated or revoked.  Performed at [facility], [address], [place], [place] [address]   Culture, blood (Routine X 2) w Reflex to ID Panel     Status: None   Collection Time: 03/19/23  9:47 AM   Specimen: BLOOD  Result Value Ref Range Status   Specimen Description BLOOD BLOOD LEFT FOREARM  Final   Special Requests   Final    BOTTLES DRAWN AEROBIC ONLY Blood Culture adequate volume   Culture   Final    NO GROWTH 5 DAYS Performed at [facility], [address] [place], [place] [address]    Report Status 03/24/2023 FINAL  Final  Culture, blood (Routine X 2) w Reflex to ID Panel     Status: None   Collection Time: 03/19/23  9:52 AM   Specimen: BLOOD  Result Value Ref Range Status   Specimen Description BLOOD BLOOD LEFT HAND  Final   Special Requests   Final    BOTTLES DRAWN AEROBIC ONLY Blood Culture adequate volume   Culture   Final    NO GROWTH 5 DAYS Performed at [facility], [address], [place], [place] [address]    Report Status 03/24/2023 FINAL  Final     Radiology Studies: [facility] Chest Port 1 View Result Date: 03/23/2023 CLINICAL DATA:  Nasogastric tube placement. EXAM: PORTABLE CHEST 1 VIEW COMPARISON:  None Available. FINDINGS: Image focused on the lower chest and upper abdomen for enteric tube placement. Tip and side port of the enteric tube below the diaphragm in the region of the mid stomach. Skin staples in the upper abdomen partially included. IMPRESSION: Tip and side port of the enteric tube below the diaphragm in the region of the mid stomach. Electronically Signed   By: Narda Rutherford M.D.   On: 03/23/2023 15:13    Scheduled Meds:   Chlorhexidine Gluconate Cloth  6 each Topical Daily   enoxaparin (LOVENOX) injection  30 mg Subcutaneous Q24H   hydrocortisone cream   Topical BID   insulin aspart  0-15 Units Subcutaneous Q6H   Latanoprostene Bunod  1 drop Ophthalmic QHS   metoprolol tartrate  5 mg Intravenous Q6H   pantoprazole (PROTONIX) IV  40 mg Intravenous Q24H   sodium chloride flush  10-40 mL Intracatheter Q12H   timolol  1 drop Both Eyes Daily   Continuous Infusions:  sodium PHOSPHATE IVPB (in mmol) 30 mmol (03/24/23 0934)   TPN ADULT (ION) 65 mL/hr at 03/23/23 1816   TPN ADULT (ION)      LOS: 8 days  Time spent: 56 mins  Sherina Stammer Laural Benes, MD How to contact the [facility] Attending or Consulting provider 7A - 7P or covering provider during after hours 7P -7A, for this patient?  Check the care team in [facility] and look for a) attending/consulting [facility] provider listed and b) the [facility] team listed Log into [email or web address] to find provider on call.  Locate the [facility] provider you are looking for under [facility] and page to a number that you can be directly reached. If you still have difficulty reaching the provider, please page the [facility] (Director on Call) for the Hospitalists listed on amion for assistance.  03/24/2023, 11:33 AM

## 2023-03-24 NOTE — Progress Notes (Signed)
Patient presented with nausea and pain this am. Medicated as ordered. Checked NG tube, bowel sounds present, suctioned verified. Family remains at bedside.

## 2023-03-25 DIAGNOSIS — E876 Hypokalemia: Secondary | ICD-10-CM | POA: Diagnosis not present

## 2023-03-25 DIAGNOSIS — K219 Gastro-esophageal reflux disease without esophagitis: Secondary | ICD-10-CM | POA: Diagnosis not present

## 2023-03-25 DIAGNOSIS — K56609 Unspecified intestinal obstruction, unspecified as to partial versus complete obstruction: Secondary | ICD-10-CM | POA: Diagnosis not present

## 2023-03-25 DIAGNOSIS — I10 Essential (primary) hypertension: Secondary | ICD-10-CM | POA: Diagnosis not present

## 2023-03-25 LAB — GLUCOSE, CAPILLARY
Glucose-Capillary: 243 mg/dL — ABNORMAL HIGH (ref 70–99)
Glucose-Capillary: 255 mg/dL — ABNORMAL HIGH (ref 70–99)
Glucose-Capillary: 299 mg/dL — ABNORMAL HIGH (ref 70–99)

## 2023-03-25 LAB — CBC
HCT: 31.1 % — ABNORMAL LOW (ref 36.0–46.0)
Hemoglobin: 10.4 g/dL — ABNORMAL LOW (ref 12.0–15.0)
MCH: 30.2 pg (ref 26.0–34.0)
MCHC: 33.4 g/dL (ref 30.0–36.0)
MCV: 90.4 fL (ref 80.0–100.0)
Platelets: 152 10*3/uL (ref 150–400)
RBC: 3.44 MIL/uL — ABNORMAL LOW (ref 3.87–5.11)
RDW: 13.5 % (ref 11.5–15.5)
WBC: 9.9 10*3/uL (ref 4.0–10.5)
nRBC: 0 % (ref 0.0–0.2)

## 2023-03-25 LAB — BASIC METABOLIC PANEL
Anion gap: 6 (ref 5–15)
BUN: 20 mg/dL (ref 8–23)
CO2: 21 mmol/L — ABNORMAL LOW (ref 22–32)
Calcium: 8.4 mg/dL — ABNORMAL LOW (ref 8.9–10.3)
Chloride: 107 mmol/L (ref 98–111)
Creatinine, Ser: 0.71 mg/dL (ref 0.44–1.00)
GFR, Estimated: >60 mL/min (ref >60)
Glucose, Bld: 253 mg/dL — ABNORMAL HIGH (ref 70–99)
Potassium: 4.7 mmol/L (ref 3.5–5.1)
Sodium: 134 mmol/L — ABNORMAL LOW (ref 135–145)

## 2023-03-25 LAB — MAGNESIUM: Magnesium: 2 mg/dL (ref 1.7–2.4)

## 2023-03-25 LAB — PHOSPHORUS: Phosphorus: 2.3 mg/dL — ABNORMAL LOW (ref 2.5–4.6)

## 2023-03-25 MED ORDER — INSULIN GLARGINE-YFGN 100 UNIT/ML ~~LOC~~ SOLN
12.0000 [IU] | Freq: Every day | SUBCUTANEOUS | Status: DC
  Administered 2023-03-25: 12 [IU] via SUBCUTANEOUS
  Filled 2023-03-25 (×3): qty 0.12

## 2023-03-25 MED ORDER — SODIUM PHOSPHATES 45 MMOLE/15ML IV SOLN
30.0000 mmol | Freq: Once | INTRAVENOUS | Status: AC
  Administered 2023-03-25: 30 mmol via INTRAVENOUS
  Filled 2023-03-25: qty 10

## 2023-03-25 MED ORDER — CALCIUM CARBONATE ANTACID 500 MG PO CHEW
1.0000 | CHEWABLE_TABLET | Freq: Two times a day (BID) | ORAL | Status: DC | PRN
  Administered 2023-03-25 – 2023-03-28 (×4): 200 mg via ORAL
  Filled 2023-03-25 (×4): qty 1

## 2023-03-25 MED ORDER — INSULIN ASPART 100 UNIT/ML IJ SOLN: 0.0000 [IU] | INTRAMUSCULAR | Status: DC

## 2023-03-25 MED ORDER — INSULIN ASPART 100 UNIT/ML IJ SOLN
0.0000 [IU] | INTRAMUSCULAR | Status: DC
  Administered 2023-03-25: 8 [IU] via SUBCUTANEOUS
  Administered 2023-03-26 (×2): 5 [IU] via SUBCUTANEOUS
  Administered 2023-03-26 (×2): 8 [IU] via SUBCUTANEOUS
  Administered 2023-03-26 – 2023-03-27 (×3): 5 [IU] via SUBCUTANEOUS
  Administered 2023-03-27: 3 [IU] via SUBCUTANEOUS
  Administered 2023-03-27 (×2): 5 [IU] via SUBCUTANEOUS
  Administered 2023-03-27 (×2): 3 [IU] via SUBCUTANEOUS
  Administered 2023-03-28: 5 [IU] via SUBCUTANEOUS
  Administered 2023-03-28: 2 [IU] via SUBCUTANEOUS
  Administered 2023-03-28 (×4): 3 [IU] via SUBCUTANEOUS

## 2023-03-25 MED ORDER — TRAVASOL 10 % IV SOLN
INTRAVENOUS | Status: AC
  Filled 2023-03-25: qty 811.2

## 2023-03-25 NOTE — Progress Notes (Addendum)
PHARMACY - TOTAL PARENTERAL NUTRITION CONSULT NOTE   Indication: Small bowel obstruction  Patient Measurements: Height: 5\' 6"  (167.6 cm) Weight: 60.2 kg (132 lb 11.5 oz) IBW/kg (Calculated) : 59.3 TPN AdjBW (KG): 57.8 Body mass index is 21.42 kg/m.   Assessment:  Patient with a small bowel mass and obstruction. Needs surgery but is now COVID positive. TPN recommended for nutrition for now with SBO which is slowly resolving but also with SB mass for surgery this morning. Will continue TPN for now  Glucose / Insulin: 228-266- 23 units insulin given  Electrolytes: K 4.7 Phos 2.3 Renal: Scr 0.71 Hepatic: WNL Albumin 2.6 GI Imaging/procedures: Surgery 12/14 - right hemicolectomy and small bowel resection Central access: PICC TPN start date: 2/10  Nutritional Goals: Goal TPN rate is 65 mL/hr (provides 81 g of protein and 1432 kcals per day)  RD Assessment: Estimated Needs Total Energy Estimated Needs: 1450-1650 Total Protein Estimated Needs: 75-85 gm Total Fluid Estimated Needs: >/= 1.5 L  Current Nutrition:  TPN  Plan:  of NaPhos this am Continue TPN at 65 mL/hr at 1800 Electrolytes in TPN: Na 61mEq/L, K 26mEq/L, Ca 68mEq/L, Mg 43mEq/L, and Phos 38mmol/L. Cl:Ac 1:1 Continue to add standard MVI and trace elements to TPN SSI increased to Moderate q6h SSI 2/15, increased requirements overnight, MD to add semglee standing dose Recheck labs in am then monitor TPN labs on Mon/Thurs  Sheppard Coil PharmD., BCPS Clinical Pharmacist 03/25/2023 9:05 AM

## 2023-03-25 NOTE — Plan of Care (Signed)
  Problem: Clinical Measurements: Goal: Ability to maintain clinical measurements within normal limits will improve Outcome: Progressing Goal: Will remain free from infection Outcome: Progressing   Problem: Safety: Goal: Ability to remain free from injury will improve Outcome: Progressing   

## 2023-03-25 NOTE — Progress Notes (Signed)
PROGRESS NOTE   Dawn Estrada  ZOX:[id number] DOB: 03/17/1919 DOA: 03/16/2023 PCP: Kirstie Peri, MD   Chief Complaint  Patient presents with   Emesis   Level of care: Med-Surg  Brief Admission History:   89 y.o. female with medical history significant of essential hypertension, hiatal hernia, hemorrhoids who presents to the emergency department due to several months onset of intermittent abdominal pain which worsened last night.  Patient was admitted for small bowel obstruction.  General surgery consulted with plans for small bowel resection and possible partial colectomy in a.m.  EKG and 2D echocardiogram ordered for preop evaluation pending.  She had fever and further evaluation reveals COVID infection and she has been started on remdesivir.  Fevers are improving and she has now had PICC line placement as well as initiation of TPN on 2/10 and is s/p exploratory lap on 2/14.    Assessment and Plan:  Small bowel obstruction - resolved  CT abdomen and pelvis with contrast showed small bowel obstruction with transition point in the distal ileum where there is an enhancing lesion measuring 1.9 x 0.9 cm. Findings are worrisome for neoplasm. Appreciate general surgery ongoing evaluation, plan for SBR on 2/14 if clinically stable EKG and 2D echocardiogram WNL with preserved EF PICC line and TPN placed for nutrition on 2/10 Pt remains on TPN and tolerating so far, continuing until bowel function returns  CT entero completed 2/12  POD s/p right hemicolectomy with additional ileum - follow postop orders per surgery team Focusing on pain and nausea control, NGT remains in place today, appreciate assistance from surgery team   COVID infection - treated  completed remdesivir 2/12  No significant hypoxemia or findings on chest x-ray Continue ongoing treatment and airborne precautions   Hyponatremia mild/hypokalemia Na 129, no known cause at this time, though may be due to medication effect  considering that patient is on HCTZ Continue to monitor while on TPN Follow    Hypoalbuminemia Albumin 3.3, consider protein supplement when patient resumes oral intake Currently on TPN for nutrition, hopefully with diet being advanced today per surgery   Hypophosphatemia -IV replacement ordered by pharmacy while managing TPN  -recheck testing ordered  Hyperglycemia from TPN -Increased SSI coverage continue frequent CBG monitoring -add basal insulin - semglee 12 units daily -should be off TPN soon as diet advances    CBG (last 3)  Recent Labs    03/24/23 2124 03/24/23 2341 03/25/23 0620  GLUCAP 249* 215* 243*   Essential hypertension Continue IV hydralazine 10 mg every 6 hours as needed for SBP > 170 Consider oral meds when patient resumes oral intake For now continue IV lopressor and increasing to 7.5 mg today with hold parameters    Hiatal hernia GERD Continue Protonix for GI protection    Hemorrhoids Continue Preparation H  DVT prophylaxis: SCDs Code Status: Full  Family Communication: long conversation with son Maurine Minister with Dr. Henreitta Leber 2/13, son bedside 2/14, 2/15, 2/16  Disposition: TBD    Consultants:  Surgery   Procedures:   Antimicrobials:    Subjective: Pt reports she is having intermittent nausea but no emesis, she is eager to have NG removed.   Objective: Vitals:   03/24/23 1936 03/24/23 1950 03/25/23 0512 03/25/23 1100  BP: 133/60 (!) 143/56 (!) 133/48 (!) 137/58  Pulse: 83 83 98 100  Resp: (!) 22 18 18    Temp: 99 F (37.2 C) (!) 97.5 F (36.4 C) 99 F (37.2 C)   TempSrc: Oral Oral Oral  SpO2: 98% 98% 98%   Weight:   60.2 kg   Height:        Intake/Output Summary (Last 24 hours) at 03/25/2023 1347 Last data filed at 03/25/2023 0500 Gross per 24 hour  Intake 1800.35 ml  Output 410 ml  Net 1390.35 ml   Filed Weights   03/23/23 0508 03/24/23 0500 03/25/23 0512  Weight: 59 kg 59.6 kg 60.2 kg   Examination:  General exam: Appears  calm and uncomfortable with NGT, not in distress.  Respiratory system: Clear to auscultation. Respiratory effort normal. Cardiovascular system: normal S1 & S2 heard. No JVD, murmurs, rubs, gallops or clicks. No pedal edema. Gastrointestinal system: Abdomen wounds C/D/I.   bowel sounds heard. Central nervous system: somnolent. No focal neurological deficits. Extremities: Symmetric 5 x 5 power. Skin: No rashes, lesions or ulcers. Psychiatry: Judgement and insight appear UTD. Mood & affect UTD.   Data Reviewed: I have personally reviewed following labs and imaging studies  CBC: Recent Labs  Lab 03/19/23 0555 03/20/23 0346 03/21/23 0438 03/24/23 0420 03/25/23 0432  WBC 5.7 4.4 4.0 8.4 9.9  NEUTROABS  --   --   --  6.3  --   HGB 10.9* 10.5* 10.8* 11.3* 10.4*  HCT 32.9* 32.9* 32.9* 35.4* 31.1*  MCV 91.1 91.6 91.4 91.0 90.4  PLT 172 165 147* 164 152    Basic Metabolic Panel: Recent Labs  Lab 03/20/23 0346 03/21/23 0438 03/22/23 0412 03/24/23 0420 03/25/23 0432  NA 134* 136 134* 132* 134*  K 3.3* 3.4* 3.4* 4.7 4.7  CL 106 106 107 108 107  CO2 22 22 20* 19* 21*  GLUCOSE 113* 116* 149* 252* 253*  BUN 9 14 18 22 20   CREATININE 0.77 0.67 0.67 0.69 0.71  CALCIUM 8.0* 8.0* 8.0* 8.3* 8.4*  MG 2.1 1.8 1.7 1.9 2.0  PHOS 1.9* 2.3* 3.0 2.0* 2.3*    CBG: Recent Labs  Lab 03/24/23 0915 03/24/23 1514 03/24/23 2124 03/24/23 2341 03/25/23 0620  GLUCAP 228* 266* 249* 215* 243*    Recent Results (from the past 240 hours)  Resp panel by RT-PCR (RSV, Flu A&B, Covid) Anterior Nasal Swab     Status: Abnormal   Collection Time: 03/19/23  9:30 AM   Specimen: Anterior Nasal Swab  Result Value Ref Range Status   SARS Coronavirus 2 by RT PCR POSITIVE (A) NEGATIVE Final    Comment: (NOTE) SARS-CoV-2 target nucleic acids are DETECTED.  The SARS-CoV-2 RNA is generally detectable in upper respiratory specimens during the acute phase of infection. Positive results are indicative of the  presence of the identified virus, but do not rule out bacterial infection or co-infection with other pathogens not detected by the test. Clinical correlation with patient history and other diagnostic information is necessary to determine patient infection status. The expected result is Negative.  Fact Sheet for Patients: BloggerCourse.com  Fact Sheet for Healthcare Providers: SeriousBroker.it  This test is not yet approved or cleared by the [place] FDA and  has been authorized for detection and/or diagnosis of SARS-CoV-2 by FDA under an Emergency Use Authorization (EUA).  This EUA will remain in effect (meaning this test can be used) for the duration of  the COVID-19 declaration under Section 564(b)(1) of the A ct, 21 U.S.C. section 360bbb-3(b)(1), unless the authorization is terminated or revoked sooner.     Influenza A by PCR NEGATIVE NEGATIVE Final   Influenza B by PCR NEGATIVE NEGATIVE Final    Comment: (NOTE) The Xpert Xpress SARS-CoV-2/FLU/RSV  plus assay is intended as an aid in the diagnosis of influenza from Nasopharyngeal swab specimens and should not be used as a sole basis for treatment. Nasal washings and aspirates are unacceptable for Xpert Xpress SARS-CoV-2/FLU/RSV testing.  Fact Sheet for Patients: BloggerCourse.com  Fact Sheet for Healthcare Providers: SeriousBroker.it  This test is not yet approved or cleared by the [place] FDA and has been authorized for detection and/or diagnosis of SARS-CoV-2 by FDA under an Emergency Use Authorization (EUA). This EUA will remain in effect (meaning this test can be used) for the duration of the COVID-19 declaration under Section 564(b)(1) of the Act, 21 U.S.C. section 360bbb-3(b)(1), unless the authorization is terminated or revoked.     Resp Syncytial Virus by PCR NEGATIVE NEGATIVE Final    Comment:  (NOTE) Fact Sheet for Patients: BloggerCourse.com  Fact Sheet for Healthcare Providers: SeriousBroker.it  This test is not yet approved or cleared by the [place] FDA and has been authorized for detection and/or diagnosis of SARS-CoV-2 by FDA under an Emergency Use Authorization (EUA). This EUA will remain in effect (meaning this test can be used) for the duration of the COVID-19 declaration under Section 564(b)(1) of the Act, 21 U.S.C. section 360bbb-3(b)(1), unless the authorization is terminated or revoked.  Performed at [facility], [address] [place], [place] [address]   Culture, blood (Routine X 2) w Reflex to ID Panel     Status: None   Collection Time: 03/19/23  9:47 AM   Specimen: BLOOD  Result Value Ref Range Status   Specimen Description BLOOD BLOOD LEFT FOREARM  Final   Special Requests   Final    BOTTLES DRAWN AEROBIC ONLY Blood Culture adequate volume   Culture   Final    NO GROWTH 5 DAYS Performed at [facility], [address] [place], [place] [address]    Report Status 03/24/2023 FINAL  Final  Culture, blood (Routine X 2) w Reflex to ID Panel     Status: None   Collection Time: 03/19/23  9:52 AM   Specimen: BLOOD  Result Value Ref Range Status   Specimen Description BLOOD BLOOD LEFT HAND  Final   Special Requests   Final    BOTTLES DRAWN AEROBIC ONLY Blood Culture adequate volume   Culture   Final    NO GROWTH 5 DAYS Performed at [facility], [address], [place], [place] [address]    Report Status 03/24/2023 FINAL  Final     Radiology Studies: No results found.   Scheduled Meds:  Chlorhexidine Gluconate Cloth  6 each Topical Daily   enoxaparin (LOVENOX) injection  30 mg Subcutaneous Q24H   hydrocortisone cream   Topical BID   insulin aspart  0-15 Units Subcutaneous Q6H   insulin glargine-yfgn  12 Units Subcutaneous Daily   Latanoprostene Bunod  1 drop Ophthalmic QHS   metoprolol  tartrate  7.5 mg Intravenous Q6H   pantoprazole (PROTONIX) IV  40 mg Intravenous Q24H   sodium chloride flush  10-40 mL Intracatheter Q12H   timolol  1 drop Both Eyes Daily   Continuous Infusions:  sodium PHOSPHATE IVPB (in mmol) 30 mmol (03/25/23 1106)   TPN ADULT (ION) 65 mL/hr at 03/24/23 1733   TPN ADULT (ION)      LOS: 9 days   Time spent: 55 mins  Jaceyon Strole Laural Benes, MD How to contact the [facility] Attending or Consulting provider 7A - 7P or covering provider during after hours 7P -7A, for this patient?  Check  the care team in [facility] and look for a) attending/consulting [facility] provider listed and b) the [facility] team listed Log into [email or web address] to find provider on call.  Locate the [facility] provider you are looking for under [facility] and page to a number that you can be directly reached. If you still have difficulty reaching the provider, please page the [facility] (Director on Call) for the Hospitalists listed on amion for assistance.  03/25/2023, 1:47 PM

## 2023-03-25 NOTE — Progress Notes (Signed)
2 Days Post-Op  Subjective: Patient has mild incisional pain.  Objective: Vital signs in last 24 hours: Temp:  [97.5 F (36.4 C)-99.1 F (37.3 C)] 99 F (37.2 C) (02/16 0512) Pulse Rate:  [83-107] 98 (02/16 0512) Resp:  [18-22] 18 (02/16 0512) BP: (133-143)/(48-63) 133/48 (02/16 0512) SpO2:  [95 %-98 %] 98 % (02/16 0512) Weight:  [60.2 kg] 60.2 kg (02/16 0512) Last BM Date : 03/21/23  Intake/Output from previous day: 02/15 0701 - 02/16 0700 In: 1800.4 [P.O.:50; I.V.:1486.4; IV Piggyback:263.9] Out: 410 [Urine:400; Emesis/NG output:10] Intake/Output this shift: No intake/output data recorded.  General appearance: alert, cooperative, and fatigued GI: Soft, incision healing well.  Occasional bowel sounds appreciated.  Lab Results:  Recent Labs    03/24/23 0420 03/25/23 0432  WBC 8.4 9.9  HGB 11.3* 10.4*  HCT 35.4* 31.1*  PLT 164 152   BMET Recent Labs    03/24/23 0420 03/25/23 0432  NA 132* 134*  K 4.7 4.7  CL 108 107  CO2 19* 21*  GLUCOSE 252* 253*  BUN 22 20  CREATININE 0.69 0.71  CALCIUM 8.3* 8.4*   PT/INR No results for input(s): "LABPROT", "INR" in the last 72 hours.  Studies/Results: DG Chest Port 1 View Result Date: 03/23/2023 CLINICAL DATA:  Nasogastric tube placement. EXAM: PORTABLE CHEST 1 VIEW COMPARISON:  None Available. FINDINGS: Image focused on the lower chest and upper abdomen for enteric tube placement. Tip and side port of the enteric tube below the diaphragm in the region of the mid stomach. Skin staples in the upper abdomen partially included. IMPRESSION: Tip and side port of the enteric tube below the diaphragm in the region of the mid stomach. Electronically Signed   By: Narda Rutherford M.D.   On: 03/23/2023 15:13    Anti-infectives: Anti-infectives (From admission, onward)    Start     Dose/Rate Route Frequency Ordered Stop   03/23/23 0800  sodium chloride 0.9 % with cefoTEtan (CEFOTAN) ADS Med       Note to Pharmacy: Daphane Shepherd  H: cabinet override      03/23/23 0800 03/23/23 0824   03/23/23 0600  cefoTEtan (CEFOTAN) 2 g in sodium chloride 0.9 % 100 mL IVPB        2 g 200 mL/hr over 30 Minutes Intravenous On call to O.R. 03/22/23 1340 03/23/23 0830   03/20/23 1000  remdesivir 100 mg in sodium chloride 0.9 % 100 mL IVPB  Status:  Discontinued       Placed in "Followed by" Linked Group   100 mg 200 mL/hr over 30 Minutes Intravenous Daily 03/19/23 1324 03/19/23 1348   03/20/23 1000  remdesivir 100 mg in sodium chloride 0.9 % 100 mL IVPB       Placed in "Followed by" Linked Group   100 mg 200 mL/hr over 30 Minutes Intravenous Daily 03/19/23 1348 03/21/23 1234   03/19/23 1445  remdesivir 100 mg in sodium chloride 0.9 % 100 mL IVPB       Placed in "Followed by" Linked Group   100 mg 200 mL/hr over 30 Minutes Intravenous Every 30 min 03/19/23 1348 03/19/23 1701   03/19/23 1415  remdesivir 200 mg in sodium chloride 0.9% 250 mL IVPB  Status:  Discontinued       Placed in "Followed by" Linked Group   200 mg 580 mL/hr over 30 Minutes Intravenous Once 03/19/23 1324 03/19/23 1348   03/19/23 0700  cefoTEtan (CEFOTAN) 2 g in sodium chloride 0.9 % 100 mL IVPB  2 g 200 mL/hr over 30 Minutes Intravenous On call to O.R. 03/18/23 1104 03/20/23 0559   03/18/23 1200  cefoTEtan (CEFOTAN) 2 g in sodium chloride 0.9 % 100 mL IVPB  Status:  Discontinued        2 g 200 mL/hr over 30 Minutes Intravenous On call to O.R. 03/18/23 1104 03/18/23 1104       Assessment/Plan: s/p Procedure(s): SMALL BOWEL RESECTION AND RIGHT HEMI-COLECTOMY Impression: Stable on postoperative day 2.  As she has had minimal output and bowel sounds are appreciated, will remove NG tube and start clear liquid diet.  In talking with Dr. Laural Benes, the patient apparently does not need a heparin drip.  Would continue TPN for now.  LOS: 9 days    Franky Macho 03/25/2023

## 2023-03-25 NOTE — Plan of Care (Signed)
   Problem: Clinical Measurements: Goal: Ability to maintain clinical measurements within normal limits will improve Outcome: Progressing Goal: Will remain free from infection Outcome: Progressing

## 2023-03-25 NOTE — Progress Notes (Signed)
Patient tolerated some clear liquids today however she complained of heartburn from the cranberry juice and still having some feelings of nausea , relieved by PRN zofran , pain controlled throughout shift with PRN fentanyl IV . Patients son at bedside. New bag of TPN started.

## 2023-03-26 ENCOUNTER — Inpatient Hospital Stay (HOSPITAL_COMMUNITY): Payer: Medicare Other

## 2023-03-26 DIAGNOSIS — E876 Hypokalemia: Secondary | ICD-10-CM | POA: Diagnosis not present

## 2023-03-26 DIAGNOSIS — K56609 Unspecified intestinal obstruction, unspecified as to partial versus complete obstruction: Secondary | ICD-10-CM | POA: Diagnosis not present

## 2023-03-26 DIAGNOSIS — K219 Gastro-esophageal reflux disease without esophagitis: Secondary | ICD-10-CM | POA: Diagnosis not present

## 2023-03-26 DIAGNOSIS — I10 Essential (primary) hypertension: Secondary | ICD-10-CM | POA: Diagnosis not present

## 2023-03-26 LAB — GLUCOSE, CAPILLARY
Glucose-Capillary: 211 mg/dL — ABNORMAL HIGH (ref 70–99)
Glucose-Capillary: 218 mg/dL — ABNORMAL HIGH (ref 70–99)
Glucose-Capillary: 220 mg/dL — ABNORMAL HIGH (ref 70–99)
Glucose-Capillary: 249 mg/dL — ABNORMAL HIGH (ref 70–99)
Glucose-Capillary: 254 mg/dL — ABNORMAL HIGH (ref 70–99)
Glucose-Capillary: 256 mg/dL — ABNORMAL HIGH (ref 70–99)
Glucose-Capillary: 277 mg/dL — ABNORMAL HIGH (ref 70–99)

## 2023-03-26 LAB — COMPREHENSIVE METABOLIC PANEL
ALT: 29 U/L (ref 0–44)
AST: 44 U/L — ABNORMAL HIGH (ref 15–41)
Albumin: 2.3 g/dL — ABNORMAL LOW (ref 3.5–5.0)
Alkaline Phosphatase: 36 U/L — ABNORMAL LOW (ref 38–126)
Anion gap: 6 (ref 5–15)
BUN: 19 mg/dL (ref 8–23)
CO2: 20 mmol/L — ABNORMAL LOW (ref 22–32)
Calcium: 8.4 mg/dL — ABNORMAL LOW (ref 8.9–10.3)
Chloride: 109 mmol/L (ref 98–111)
Creatinine, Ser: 0.77 mg/dL (ref 0.44–1.00)
GFR, Estimated: >60 mL/min (ref >60)
Glucose, Bld: 236 mg/dL — ABNORMAL HIGH (ref 70–99)
Potassium: 4.5 mmol/L (ref 3.5–5.1)
Sodium: 135 mmol/L (ref 135–145)
Total Bilirubin: 0.5 mg/dL (ref 0.0–1.2)
Total Protein: 5.7 g/dL — ABNORMAL LOW (ref 6.5–8.1)

## 2023-03-26 LAB — SURGICAL PATHOLOGY

## 2023-03-26 LAB — CBC
HCT: 29.6 % — ABNORMAL LOW (ref 36.0–46.0)
Hemoglobin: 9.4 g/dL — ABNORMAL LOW (ref 12.0–15.0)
MCH: 29 pg (ref 26.0–34.0)
MCHC: 31.8 g/dL (ref 30.0–36.0)
MCV: 91.4 fL (ref 80.0–100.0)
Platelets: 151 10*3/uL (ref 150–400)
RBC: 3.24 MIL/uL — ABNORMAL LOW (ref 3.87–5.11)
RDW: 13.7 % (ref 11.5–15.5)
WBC: 8.7 10*3/uL (ref 4.0–10.5)
nRBC: 0 % (ref 0.0–0.2)

## 2023-03-26 LAB — MAGNESIUM: Magnesium: 1.8 mg/dL (ref 1.7–2.4)

## 2023-03-26 LAB — PHOSPHORUS: Phosphorus: 2.9 mg/dL (ref 2.5–4.6)

## 2023-03-26 LAB — TRIGLYCERIDES: Triglycerides: 43 mg/dL (ref <150)

## 2023-03-26 MED ORDER — INSULIN GLARGINE-YFGN 100 UNIT/ML ~~LOC~~ SOLN
14.0000 [IU] | Freq: Every day | SUBCUTANEOUS | Status: DC
  Administered 2023-03-26 – 2023-03-27 (×2): 14 [IU] via SUBCUTANEOUS
  Filled 2023-03-26 (×3): qty 0.14

## 2023-03-26 MED ORDER — METOPROLOL TARTRATE 50 MG PO TABS
50.0000 mg | ORAL_TABLET | Freq: Two times a day (BID) | ORAL | Status: DC
Start: 2023-03-27 — End: 2023-03-29
  Administered 2023-03-27 – 2023-03-29 (×5): 50 mg via ORAL
  Filled 2023-03-26 (×5): qty 1

## 2023-03-26 MED ORDER — DOCUSATE SODIUM 100 MG PO CAPS
100.0000 mg | ORAL_CAPSULE | Freq: Two times a day (BID) | ORAL | Status: DC
  Administered 2023-03-27: 100 mg via ORAL
  Filled 2023-03-26 (×3): qty 1

## 2023-03-26 MED ORDER — INSULIN ASPART 100 UNIT/ML IJ SOLN
3.0000 [IU] | Freq: Three times a day (TID) | INTRAMUSCULAR | Status: DC
  Administered 2023-03-26: 3 [IU] via SUBCUTANEOUS

## 2023-03-26 MED ORDER — DEXTROMETHORPHAN POLISTIREX ER 30 MG/5ML PO SUER
30.0000 mg | Freq: Two times a day (BID) | ORAL | Status: DC | PRN
  Administered 2023-03-26: 30 mg via ORAL
  Filled 2023-03-26: qty 5

## 2023-03-26 MED ORDER — TRAVASOL 10 % IV SOLN
INTRAVENOUS | Status: AC
  Filled 2023-03-26: qty 811.2

## 2023-03-26 NOTE — Progress Notes (Addendum)
Patient had no pain this shift, she did have some nausea in which was relieved with PRN zofran IV . Patient had 2 bowel movements this shift and is tolerating clear liquids , and has now upgraded to full. No signs or symptoms of aspiration , prn cough medication given for congestion that patient has presented with this shift. Patients son at bedside throughout shift and supportive in her care.

## 2023-03-26 NOTE — Progress Notes (Signed)
PHARMACY - TOTAL PARENTERAL NUTRITION CONSULT NOTE   Indication: Small bowel obstruction  Patient Measurements: Height: 5\' 6"  (167.6 cm) Weight: 68.8 kg (151 lb 10.8 oz) IBW/kg (Calculated) : 59.3 TPN AdjBW (KG): 57.8 Body mass index is 24.48 kg/m.   Assessment:  Patient with a small bowel mass and obstruction. Needs surgery but is now COVID positive. TPN recommended for nutrition for now with SBO which is slowly resolving but also with SB mass for surgery this morning. Will continue TPN for now  Glucose / Insulin: 220-299- 34 units insulin given . MD added semglee 12 units Electrolytes: WNL Renal: Scr 0.71 Hepatic: WNL Albumin 2.6 GI Imaging/procedures: Surgery 12/14 - right hemicolectomy and small bowel resection Central access: PICC TPN start date: 2/10  Nutritional Goals: Goal TPN rate is 65 mL/hr (provides 81 g of protein and 1432 kcals per day)  RD Assessment: Estimated Needs Total Energy Estimated Needs: 1450-1650 Total Protein Estimated Needs: 75-85 gm Total Fluid Estimated Needs: >/= 1.5 L  Current Nutrition:  TPN  Plan:  Continue TPN at 65 mL/hr at 1800 Electrolytes in TPN: Na 60 mEq/L, K 80mEq/L, Ca 30mEq/L, Mg 36mEq/L, and Phos 68mmol/L. Cl:Ac 1:2 Add 10 units insulin to TPN Continue to add standard MVI and trace elements to TPN SSI increased to Moderate q6h SSI 2/15, increased requirements overnight, MD to add semglee standing dose Recheck labs in am then monitor TPN labs on Mon/Thurs  Judeth Cornfield, PharmD Clinical Pharmacist 03/26/2023 8:08 AM

## 2023-03-26 NOTE — Progress Notes (Signed)
[facility]  NG removed yesterday and clears started. Has been coughing, concern for some aspiration with fever today. Based on my conversation with Dr. Laural Benes.  Continues on TPN now.    BP (!) 141/55   Pulse (!) 103   Temp 99 F (37.2 C)   Resp 16   Ht 5\' 6"  (1.676 m)   Wt 68.8 kg   SpO2 98%   BMI 24.48 kg/m  Mildly distended, honeycomb with no drainage or erythema on staples Binder replaced  Labs reviewed. No CBC  Patient s/p right hemicolectomy and small bowel resection for small bowel mass.  PRN for pain Clear diet Dr. Laural Benes getting CXR to look for any pna given aspiration Discussed with son Holding on advancing diet They need to discuss SNF/ rehab options or if they plan to take him home  Algis Greenhouse, MD [facility] [address] Vella Raring [place], [place] [address] [phone] (office)

## 2023-03-26 NOTE — Plan of Care (Signed)
  Problem: Clinical Measurements: Goal: Ability to maintain clinical measurements within normal limits will improve Outcome: Progressing Goal: Will remain free from infection Outcome: Progressing   Problem: Safety: Goal: Ability to remain free from injury will improve Outcome: Progressing   

## 2023-03-26 NOTE — Progress Notes (Signed)
   03/26/23 1400  Output (mL)  Stool 1 mL  Unmeasured Output  Stool Occurrence 1  Stool Characteristics  Bowel Incontinence Yes  Stool Type Type 6 (Mushy consistency with ragged edges)  Has the patient had three Type 7 stools in the last 24 hours? No  Stool Descriptors Brown  Stool Amount Medium  Stool Source Rectum

## 2023-03-26 NOTE — Evaluation (Signed)
Clinical/Bedside Swallow Evaluation Patient Details  Name: Dawn Estrada MRN: [id number] Date of Birth: [birth year]  Today's Date: 03/26/2023 Time: SLP Start Time (ACUTE ONLY): 1654 SLP Stop Time (ACUTE ONLY): 1717 SLP Time Calculation (min) (ACUTE ONLY): 23 min  Past Medical History:  Past Medical History:  Diagnosis Date   Anxiety    Arthritis    Hiatal hernia    Hypertension    Reflux    Past Surgical History:  Past Surgical History:  Procedure Laterality Date   ABDOMINAL HYSTERECTOMY     ABDOMINAL SURGERY     resection   APPENDECTOMY     BOWEL RESECTION N/A 03/23/2023   Procedure: SMALL BOWEL RESECTION AND RIGHT HEMI-COLECTOMY;  Surgeon: [name], MD;  Location: [facility];  Service: General;  Laterality: N/A;   HEMORROIDECTOMY     TONSILLECTOMY     HPI:  89 y.o. female with medical history significant of essential hypertension, hiatal hernia, hemorrhoids who presents to the emergency department due to several months onset of intermittent abdominal pain which worsened last night.  Patient was admitted for small bowel obstruction.  General surgery consulted with plans for small bowel resection and possible partial colectomy in a.m.  EKG and 2D echocardiogram ordered for preop evaluation pending.  She had fever and further evaluation reveals COVID infection and she has been started on remdesivir.  Fevers are improving and she has now had PICC line placement as well as initiation of TPN on 2/10 and is s/p exploratory lap on 2/14. BSE requested. Chest xray shows:  Improved aeration of the left lung base. Small residual left  pleural effusion and associated atelectasis.  2. Small hiatal hernia.    Assessment / Plan / Recommendation  Clinical Impression  Clinical swallow evaluation completed at bedside with Pt's son present. Pt had NG removed yesterday and was noted to have a cough following that. Oral motor exam is WNL. Pt wears U/L dentures. She consumed a small amount of  thin water via cup/straw with some sublte audible "gurgling" at times and applesauce. No overt signs or symptoms of aspiration with consistencies presented, although intake was limited. Pt's son reported that Pt with an episode of regurgitation/emesis after broth earlier today, but nothing further. SLP reviewed signs of aspiration and encouraged pt to proceed slowly with PO intake given prolonged hospitalization and recent diet advancement. OK to continue with diet as ordered, full liquids, and SLP will follow Pt during acute stay. Regular solids were not given as Pt just advanced to full liquids per surgeon. SLP Visit Diagnosis: Dysphagia, unspecified (R13.10)    Aspiration Risk  Mild aspiration risk    Diet Recommendation Dysphagia 1 (Puree);Thin liquid (full liquids per MD at this time)    Liquid Administration via: Cup;Straw Medication Administration: Whole meds with liquid Supervision: Patient able to self feed;Intermittent supervision to cue for compensatory strategies Compensations: Slow rate;Small sips/bites Postural Changes: Seated upright at 90 degrees;Remain upright for at least 30 minutes after po intake    Other  Recommendations Oral Care Recommendations: Oral care BID;Staff/trained caregiver to provide oral care    Recommendations for follow up therapy are one component of a multi-disciplinary discharge planning process, led by the attending physician.  Recommendations may be updated based on patient status, additional functional criteria and insurance authorization.  Follow up Recommendations Follow physician's recommendations for discharge plan and follow up therapies      Assistance Recommended at Discharge    Functional Status Assessment Patient has had a recent  decline in their functional status and demonstrates the ability to make significant improvements in function in a reasonable and predictable amount of time.  Frequency and Duration min 2x/week  1 week        Prognosis Prognosis for improved oropharyngeal function: Good      Swallow Study   General Date of Onset: 03/16/23 HPI: 89 y.o. female with medical history significant of essential hypertension, hiatal hernia, hemorrhoids who presents to the emergency department due to several months onset of intermittent abdominal pain which worsened last night.  Patient was admitted for small bowel obstruction.  General surgery consulted with plans for small bowel resection and possible partial colectomy in a.m.  EKG and 2D echocardiogram ordered for preop evaluation pending.  She had fever and further evaluation reveals COVID infection and she has been started on remdesivir.  Fevers are improving and she has now had PICC line placement as well as initiation of TPN on 2/10 and is s/p exploratory lap on 2/14. BSE requested. Chest xray shows:  Improved aeration of the left lung base. Small residual left  pleural effusion and associated atelectasis.  2. Small hiatal hernia. Type of Study: Bedside Swallow Evaluation Previous Swallow Assessment: None on record Diet Prior to this Study: Full liquid diet Temperature Spikes Noted: No Respiratory Status: Room air History of Recent Intubation: Yes Total duration of intubation (days): 1 days Date extubated: 03/23/23 Behavior/Cognition: Alert;Cooperative;Pleasant mood Oral Cavity Assessment: Within Functional Limits Oral Care Completed by SLP: Yes Oral Cavity - Dentition: Dentures, top;Dentures, bottom Vision: Functional for self-feeding Self-Feeding Abilities: Able to feed self Patient Positioning: Upright in bed Baseline Vocal Quality: Normal;Low vocal intensity Volitional Cough: Weak Volitional Swallow: Able to elicit    Oral/Motor/Sensory Function Overall Oral Motor/Sensory Function: Within functional limits   Ice Chips Ice chips: Within functional limits Presentation: Spoon   Thin Liquid Thin Liquid: Within functional limits Presentation: Cup;Self  Fed;Straw    Nectar Thick Nectar Thick Liquid: Not tested   Honey Thick Honey Thick Liquid: Not tested   Puree Puree: Within functional limits Presentation: Spoon   Solid     Solid: Not tested Presentation:  (Pt only advanced to full liquids per MD)     Thank you,  Havery Moros, CCC-SLP [phone]  Carlester Kasparek 03/26/2023,5:44 PM

## 2023-03-26 NOTE — Plan of Care (Signed)
This pt is progressing well. Received insulin coverage during night shift. Elevated temp of 101.1. Pt given Tylenol. Tolerated well, repositioned in bed, pt states she feel better. Will continue to monitor for remainder of nursing shift.

## 2023-03-26 NOTE — Progress Notes (Signed)
PROGRESS NOTE   Dawn Estrada  ZOX:[id number] DOB: 03-15-1919 DOA: 03/16/2023 PCP: Kirstie Peri, MD   Chief Complaint  Patient presents with   Emesis   Level of care: Med-Surg  Brief Admission History:  89 y.o. female with medical history significant of essential hypertension, hiatal hernia, hemorrhoids who presents to the emergency department due to several months onset of intermittent abdominal pain which worsened last night.  Patient was admitted for small bowel obstruction.  General surgery consulted with plans for small bowel resection and possible partial colectomy in a.m.  EKG and 2D echocardiogram ordered for preop evaluation pending.  She had fever and further evaluation reveals COVID infection and she has been started on remdesivir.  Fevers are improving and she has now had PICC line placement as well as initiation of TPN on 2/10 and is s/p exploratory lap on 2/14.    Assessment and Plan:  Small bowel obstruction - resolved  CT abdomen and pelvis with contrast showed small bowel obstruction with transition point in the distal ileum where there is an enhancing lesion measuring 1.9 x 0.9 cm. Findings are worrisome for neoplasm. Appreciate general surgery ongoing evaluation, plan for SBR on 2/14 if clinically stable EKG and 2D echocardiogram WNL with preserved EF PICC line and TPN placed for nutrition on 2/10 Pt remains on TPN and tolerating so far, continuing until bowel function returns  CT entero completed 2/12  POD s/p right hemicolectomy with additional ileum - follow postop orders per surgery team Focusing on pain and nausea control, NGT remains in place today, appreciate assistance from surgery team  Bowel function returning, started on clears and advanced to full liquids on 2/17   Fever and cough - check portable CXR - aspiration precautions - SLP eval as she is HIGH RISK for aspiration   COVID infection - treated  completed remdesivir 2/12  No significant  hypoxemia or findings on chest x-ray Continue ongoing treatment and airborne precautions   Hyponatremia mild/hypokalemia Na 129, no known cause at this time, though may be due to medication effect considering that patient is on HCTZ Continue to monitor while on TPN Follow with daily TPN labs   Hypoalbuminemia Albumin 3.3, consider protein supplement when patient resumes oral intake Currently on TPN for nutrition, hopefully with diet being advanced today per surgery   Hypophosphatemia -IV replacement ordered by pharmacy while managing TPN  -recheck testing ordered  Hyperglycemia from TPN -Increased SSI coverage continue frequent CBG monitoring to every 4 hours -add basal insulin - semglee increased to 14 units daily -should be off TPN as diet advances and we will stop insulin at that time    CBG (last 3)  Recent Labs    03/26/23 0731 03/26/23 1110 03/26/23 1624  GLUCAP 211* 249* 254*   Essential hypertension Continue IV hydralazine 10 mg every 6 hours as needed for SBP > 170 Consider oral meds when patient resumes oral intake For now continue IV lopressor and increasing to 7.5 mg today with hold parameters    Hiatal hernia GERD Continue Protonix for GI protection    Hemorrhoids Continue Preparation H  DVT prophylaxis: SCDs Code Status: Full  Family Communication: long conversation with son Maurine Minister with Dr. Henreitta Leber 2/13, son bedside 2/14, 2/15, 2/16, 2/17  Disposition: TBD    Consultants:  Surgery   Procedures:   Antimicrobials:    Subjective: Pt tolerating clears   Objective: Vitals:   03/25/23 2034 03/26/23 0351 03/26/23 0435 03/26/23 0600  BP: (!) 135/58  Marland Kitchen)  136/59 (!) 141/55  Pulse: (!) 101  (!) 107 (!) 103  Resp: 19  18 16   Temp: 100 F (37.8 C)  (!) 101.4 F (38.6 C) 99 F (37.2 C)  TempSrc: Oral  Oral   SpO2: 100%  97% 98%  Weight:  68.8 kg    Height:        Intake/Output Summary (Last 24 hours) at 03/26/2023 1701 Last data filed at  03/26/2023 1400 Gross per 24 hour  Intake 10 ml  Output 1101 ml  Net -1091 ml   Filed Weights   03/24/23 0500 03/25/23 0512 03/26/23 0351  Weight: 59.6 kg 60.2 kg 68.8 kg   Examination:  General exam: Appears calm and uncomfortable with NGT, not in distress.  Respiratory system: Clear to auscultation. Respiratory effort normal. Cardiovascular system: normal S1 & S2 heard. No JVD, murmurs, rubs, gallops or clicks. No pedal edema. Gastrointestinal system: Abdomen wounds C/D/I.   bowel sounds heard. Central nervous system: somnolent. No focal neurological deficits. Extremities: Symmetric 5 x 5 power. Skin: No rashes, lesions or ulcers. Psychiatry: Judgement and insight appear UTD. Mood & affect UTD.   Data Reviewed: I have personally reviewed following labs and imaging studies  CBC: Recent Labs  Lab 03/20/23 0346 03/21/23 0438 03/24/23 0420 03/25/23 0432 03/26/23 0425  WBC 4.4 4.0 8.4 9.9 8.7  NEUTROABS  --   --  6.3  --   --   HGB 10.5* 10.8* 11.3* 10.4* 9.4*  HCT 32.9* 32.9* 35.4* 31.1* 29.6*  MCV 91.6 91.4 91.0 90.4 91.4  PLT 165 147* 164 152 151    Basic Metabolic Panel: Recent Labs  Lab 03/21/23 0438 03/22/23 0412 03/24/23 0420 03/25/23 0432 03/26/23 0425  NA 136 134* 132* 134* 135  K 3.4* 3.4* 4.7 4.7 4.5  CL 106 107 108 107 109  CO2 22 20* 19* 21* 20*  GLUCOSE 116* 149* 252* 253* 236*  BUN 14 18 22 20 19   CREATININE 0.67 0.67 0.69 0.71 0.77  CALCIUM 8.0* 8.0* 8.3* 8.4* 8.4*  MG 1.8 1.7 1.9 2.0 1.8  PHOS 2.3* 3.0 2.0* 2.3* 2.9    CBG: Recent Labs  Lab 03/26/23 0109 03/26/23 0438 03/26/23 0731 03/26/23 1110 03/26/23 1624  GLUCAP 256* 220* 211* 249* 254*    Recent Results (from the past 240 hours)  Resp panel by RT-PCR (RSV, Flu A&B, Covid) Anterior Nasal Swab     Status: Abnormal   Collection Time: 03/19/23  9:30 AM   Specimen: Anterior Nasal Swab  Result Value Ref Range Status   SARS Coronavirus 2 by RT PCR POSITIVE (A) NEGATIVE Final     Comment: (NOTE) SARS-CoV-2 target nucleic acids are DETECTED.  The SARS-CoV-2 RNA is generally detectable in upper respiratory specimens during the acute phase of infection. Positive results are indicative of the presence of the identified virus, but do not rule out bacterial infection or co-infection with other pathogens not detected by the test. Clinical correlation with patient history and other diagnostic information is necessary to determine patient infection status. The expected result is Negative.  Fact Sheet for Patients: BloggerCourse.com  Fact Sheet for Healthcare Providers: SeriousBroker.it  This test is not yet approved or cleared by the [place] FDA and  has been authorized for detection and/or diagnosis of SARS-CoV-2 by FDA under an Emergency Use Authorization (EUA).  This EUA will remain in effect (meaning this test can be used) for the duration of  the COVID-19 declaration under Section 564(b)(1) of the A ct,  21 U.S.C. section 360bbb-3(b)(1), unless the authorization is terminated or revoked sooner.     Influenza A by PCR NEGATIVE NEGATIVE Final   Influenza B by PCR NEGATIVE NEGATIVE Final    Comment: (NOTE) The Xpert Xpress SARS-CoV-2/FLU/RSV plus assay is intended as an aid in the diagnosis of influenza from Nasopharyngeal swab specimens and should not be used as a sole basis for treatment. Nasal washings and aspirates are unacceptable for Xpert Xpress SARS-CoV-2/FLU/RSV testing.  Fact Sheet for Patients: BloggerCourse.com  Fact Sheet for Healthcare Providers: SeriousBroker.it  This test is not yet approved or cleared by the [place] FDA and has been authorized for detection and/or diagnosis of SARS-CoV-2 by FDA under an Emergency Use Authorization (EUA). This EUA will remain in effect (meaning this test can be used) for the duration of  the COVID-19 declaration under Section 564(b)(1) of the Act, 21 U.S.C. section 360bbb-3(b)(1), unless the authorization is terminated or revoked.     Resp Syncytial Virus by PCR NEGATIVE NEGATIVE Final    Comment: (NOTE) Fact Sheet for Patients: BloggerCourse.com  Fact Sheet for Healthcare Providers: SeriousBroker.it  This test is not yet approved or cleared by the [place] FDA and has been authorized for detection and/or diagnosis of SARS-CoV-2 by FDA under an Emergency Use Authorization (EUA). This EUA will remain in effect (meaning this test can be used) for the duration of the COVID-19 declaration under Section 564(b)(1) of the Act, 21 U.S.C. section 360bbb-3(b)(1), unless the authorization is terminated or revoked.  Performed at [facility], [address] [place], [place] [address]   Culture, blood (Routine X 2) w Reflex to ID Panel     Status: None   Collection Time: 03/19/23  9:47 AM   Specimen: BLOOD  Result Value Ref Range Status   Specimen Description BLOOD BLOOD LEFT FOREARM  Final   Special Requests   Final    BOTTLES DRAWN AEROBIC ONLY Blood Culture adequate volume   Culture   Final    NO GROWTH 5 DAYS Performed at [facility], [address], [place], [place] [address]    Report Status 03/24/2023 FINAL  Final  Culture, blood (Routine X 2) w Reflex to ID Panel     Status: None   Collection Time: 03/19/23  9:52 AM   Specimen: BLOOD  Result Value Ref Range Status   Specimen Description BLOOD BLOOD LEFT HAND  Final   Special Requests   Final    BOTTLES DRAWN AEROBIC ONLY Blood Culture adequate volume   Culture   Final    NO GROWTH 5 DAYS Performed at [facility], [address], [place], [place] [address]    Report Status 03/24/2023 FINAL  Final     Radiology Studies: DG CHEST PORT 1 VIEW Result Date: 03/26/2023 CLINICAL DATA:  Cough. EXAM: PORTABLE CHEST 1 VIEW COMPARISON:  Chest radiograph dated  03/19/2023. FINDINGS: Right-sided PICC with tip at the cavoatrial junction. Improved aeration of the left lung base. Probable small residual left pleural effusion and associated atelectasis. The right lung is clear. No pneumothorax. Stable cardiac silhouette. Small hiatal hernia. No acute osseous pathology. IMPRESSION: 1. Improved aeration of the left lung base. Small residual left pleural effusion and associated atelectasis. 2. Small hiatal hernia. Electronically Signed   By: Elgie Collard M.D.   On: 03/26/2023 16:40   Scheduled Meds:  Chlorhexidine Gluconate Cloth  6 each Topical Daily   docusate sodium  100 mg Oral BID   enoxaparin (LOVENOX) injection  30 mg  Subcutaneous Q24H   hydrocortisone cream   Topical BID   insulin aspart  0-15 Units Subcutaneous Q4H   [START ON 03/27/2023] insulin aspart  3 Units Subcutaneous TID WC   insulin glargine-yfgn  14 Units Subcutaneous Daily   Latanoprostene Bunod  1 drop Ophthalmic QHS   metoprolol tartrate  7.5 mg Intravenous Q6H   pantoprazole (PROTONIX) IV  40 mg Intravenous Q24H   sodium chloride flush  10-40 mL Intracatheter Q12H   timolol  1 drop Both Eyes Daily   Continuous Infusions:  TPN ADULT (ION) 65 mL/hr at 03/25/23 1757   TPN ADULT (ION)      LOS: 10 days   Time spent: 55 mins  Caidyn Henricksen Laural Benes, MD How to contact the [facility] Attending or Consulting provider 7A - 7P or covering provider during after hours 7P -7A, for this patient?  Check the care team in [facility] and look for a) attending/consulting [facility] provider listed and b) the [facility] team listed Log into [email or web address] to find provider on call.  Locate the [facility] provider you are looking for under [facility] and page to a number that you can be directly reached. If you still have difficulty reaching the provider, please page the [facility] (Director on Call) for the Hospitalists listed on amion for assistance.  03/26/2023, 5:01 PM

## 2023-03-27 DIAGNOSIS — K56609 Unspecified intestinal obstruction, unspecified as to partial versus complete obstruction: Secondary | ICD-10-CM | POA: Diagnosis not present

## 2023-03-27 DIAGNOSIS — I10 Essential (primary) hypertension: Secondary | ICD-10-CM | POA: Diagnosis not present

## 2023-03-27 DIAGNOSIS — E876 Hypokalemia: Secondary | ICD-10-CM | POA: Diagnosis not present

## 2023-03-27 DIAGNOSIS — K219 Gastro-esophageal reflux disease without esophagitis: Secondary | ICD-10-CM | POA: Diagnosis not present

## 2023-03-27 LAB — CBC WITH DIFFERENTIAL/PLATELET
Abs Immature Granulocytes: 0.16 10*3/uL — ABNORMAL HIGH (ref 0.00–0.07)
Basophils Absolute: 0 10*3/uL (ref 0.0–0.1)
Basophils Relative: 0 %
Eosinophils Absolute: 0.2 10*3/uL (ref 0.0–0.5)
Eosinophils Relative: 2 %
HCT: 28.5 % — ABNORMAL LOW (ref 36.0–46.0)
Hemoglobin: 9.4 g/dL — ABNORMAL LOW (ref 12.0–15.0)
Immature Granulocytes: 2 %
Lymphocytes Relative: 16 %
Lymphs Abs: 1.3 10*3/uL (ref 0.7–4.0)
MCH: 30.4 pg (ref 26.0–34.0)
MCHC: 33 g/dL (ref 30.0–36.0)
MCV: 92.2 fL (ref 80.0–100.0)
Monocytes Absolute: 1 10*3/uL (ref 0.1–1.0)
Monocytes Relative: 12 %
Neutro Abs: 5.6 10*3/uL (ref 1.7–7.7)
Neutrophils Relative %: 68 %
Platelets: 166 10*3/uL (ref 150–400)
RBC: 3.09 MIL/uL — ABNORMAL LOW (ref 3.87–5.11)
RDW: 13.7 % (ref 11.5–15.5)
WBC: 8.2 10*3/uL (ref 4.0–10.5)
nRBC: 0 % (ref 0.0–0.2)

## 2023-03-27 LAB — GLUCOSE, CAPILLARY
Glucose-Capillary: 184 mg/dL — ABNORMAL HIGH (ref 70–99)
Glucose-Capillary: 186 mg/dL — ABNORMAL HIGH (ref 70–99)
Glucose-Capillary: 188 mg/dL — ABNORMAL HIGH (ref 70–99)
Glucose-Capillary: 192 mg/dL — ABNORMAL HIGH (ref 70–99)
Glucose-Capillary: 201 mg/dL — ABNORMAL HIGH (ref 70–99)
Glucose-Capillary: 209 mg/dL — ABNORMAL HIGH (ref 70–99)
Glucose-Capillary: 225 mg/dL — ABNORMAL HIGH (ref 70–99)

## 2023-03-27 LAB — BASIC METABOLIC PANEL
Anion gap: 4 — ABNORMAL LOW (ref 5–15)
BUN: 20 mg/dL (ref 8–23)
CO2: 24 mmol/L (ref 22–32)
Calcium: 8.7 mg/dL — ABNORMAL LOW (ref 8.9–10.3)
Chloride: 107 mmol/L (ref 98–111)
Creatinine, Ser: 0.72 mg/dL (ref 0.44–1.00)
GFR, Estimated: >60 mL/min (ref >60)
Glucose, Bld: 180 mg/dL — ABNORMAL HIGH (ref 70–99)
Potassium: 4.4 mmol/L (ref 3.5–5.1)
Sodium: 135 mmol/L (ref 135–145)

## 2023-03-27 LAB — MAGNESIUM: Magnesium: 1.8 mg/dL (ref 1.7–2.4)

## 2023-03-27 LAB — PHOSPHORUS: Phosphorus: 2.2 mg/dL — ABNORMAL LOW (ref 2.5–4.6)

## 2023-03-27 MED ORDER — SODIUM PHOSPHATES 45 MMOLE/15ML IV SOLN
15.0000 mmol | Freq: Once | INTRAVENOUS | Status: AC
  Administered 2023-03-27: 15 mmol via INTRAVENOUS
  Filled 2023-03-27: qty 5

## 2023-03-27 MED ORDER — ENSURE ENLIVE PO LIQD
237.0000 mL | Freq: Two times a day (BID) | ORAL | Status: DC
  Administered 2023-03-27 – 2023-03-29 (×4): 237 mL via ORAL

## 2023-03-27 MED ORDER — FAT EMUL FISH OIL/PLANT BASED 20% (SMOFLIPID)IV EMUL
INTRAVENOUS | Status: AC
  Filled 2023-03-27: qty 811.2

## 2023-03-27 NOTE — Progress Notes (Signed)
Physical Therapy Treatment Patient Details Name: Dawn Estrada MRN: [id number] DOB: [birth year] Today's Date: 03/27/2023   History of Present Illness PEr MD: JOSI ROEDIGER is a 89 y.o. female with medical history significant of essential hypertension, hiatal hernia, hemorrhoids who presents to the emergency department due to several months onset of intermittent abdominal pain which worsened last night.  Pain was mid abdominal and was rated as 10/10 on pain scale, this was associated with nausea, vomiting and some diarrhea last night.  There was no vomiting or diarrhea today.  Family was worried about dehydration and she was taken to the ED for further evaluation and management.    PT Comments  REASSESSMENT ( Right hemicolectomy with additional ileum on 03/23/23. Patient presents seated in chair (assisted by nursing staff) and agreeable for therapy.  Patient demonstrates slow labored movement ambulating in room, able to transfer to/from commode in bathroom requiring verbal cue for proper hand placement, tolerated walking in room without loss of balance, but frequent verbal/tactile cueing to avoid bumping into objects with RW.  Patient tolerated staying up in chair after therapy with son present. Patient will benefit from continued skilled physical therapy in hospital and recommended venue below to increase strength, balance, endurance for safe ADLs and gait.     If plan is discharge home, recommend the following: A little help with bathing/dressing/bathroom;Help with stairs or ramp for entrance;Assistance with cooking/housework;A lot of help with walking and/or transfers   Can travel by private vehicle        Equipment Recommendations  None recommended by PT    Recommendations for Other Services       Precautions / Restrictions Precautions Precautions: Fall Restrictions Weight Bearing Restrictions Per Provider Order: No     Mobility  Bed Mobility               General bed  mobility comments: Patient presents seated in chair (assisted by nursing)    Transfers Overall transfer level: Needs assistance Equipment used: Rolling walker (2 wheels) Transfers: Sit to/from Stand, Bed to chair/wheelchair/BSC Sit to Stand: Min assist   Step pivot transfers: Min assist       General transfer comment: slow labored movement, verbal cueing for proper hand placement during transfers to/from commode in bathroom    Ambulation/Gait Ambulation/Gait assistance: Min assist Gait Distance (Feet): 20 Feet Assistive device: Rolling walker (2 wheels) Gait Pattern/deviations: Decreased step length - right, Decreased step length - left, Decreased stride length, Trunk flexed Gait velocity: decreased     General Gait Details: slow labored movement requiring frequent verbal cueing for safety using RW, limited mostly due to fatigue and abdominal discomfort   Stairs             Wheelchair Mobility     Tilt Bed    Modified Rankin (Stroke Patients Only)       Balance Overall balance assessment: Needs assistance Sitting-balance support: Feet supported, No upper extremity supported Sitting balance-Leahy Scale: Fair Sitting balance - Comments: seated in chair   Standing balance support: Reliant on assistive device for balance, During functional activity, Bilateral upper extremity supported Standing balance-Leahy Scale: Fair Standing balance comment: using RW                            Communication Communication Communication: No apparent difficulties  Cognition Arousal: Alert Behavior During Therapy: WFL for tasks assessed/performed   PT - Cognitive impairments: No apparent impairments  Following commands: Intact      Cueing Cueing Techniques: Verbal cues, Tactile cues  Exercises General Exercises - Lower Extremity Toe Raises: Seated, AROM, Strengthening, Both, 10 reps Heel Raises: Seated, AROM, Strengthening,  Both, 10 reps    General Comments        Pertinent Vitals/Pain Pain Assessment Pain Assessment: Faces Faces Pain Scale: Hurts a little bit Pain Location: abdominal Pain Descriptors / Indicators: Grimacing, Sore Pain Intervention(s): Limited activity within patient's tolerance, Monitored during session, Repositioned    Home Living                          Prior Function            PT Goals (current goals can now be found in the care plan section) Acute Rehab PT Goals Patient Stated Goal: to go home PT Goal Formulation: With patient/family Time For Goal Achievement: 03/28/23 Potential to Achieve Goals: Good Progress towards PT goals: Progressing toward goals    Frequency    Min 3X/week      PT Plan      Co-evaluation              AM-PAC PT "6 Clicks" Mobility   Outcome Measure  Help needed turning from your back to your side while in a flat bed without using bedrails?: A Little Help needed moving from lying on your back to sitting on the side of a flat bed without using bedrails?: A Little Help needed moving to and from a bed to a chair (including a wheelchair)?: A Little Help needed standing up from a chair using your arms (e.g., wheelchair or bedside chair)?: A Little Help needed to walk in hospital room?: A Lot Help needed climbing 3-5 steps with a railing? : A Lot 6 Click Score: 16    End of Session   Activity Tolerance: Patient tolerated treatment well;Patient limited by fatigue Patient left: in chair;with call bell/phone within reach;with family/visitor present Nurse Communication: Mobility status PT Visit Diagnosis: Unsteadiness on feet (R26.81);Other abnormalities of gait and mobility (R26.89);Muscle weakness (generalized) (M62.81)     Time: 5643-3295 PT Time Calculation (min) (ACUTE ONLY): 30 min  Charges:    $Gait Training: 8-22 mins $Therapeutic Activity: 8-22 mins PT General Charges $$ ACUTE PT VISIT: 1 Visit                      2:22 PM, 03/27/23 Ocie Bob, MPT Physical Therapist with [facility] [phone] office [phone] mobile phone

## 2023-03-27 NOTE — TOC Progression Note (Signed)
Transition of Care [facility]) - Progression Note    Patient Details  Name: Dawn Estrada MRN: [id number] Date of Birth: Jun 06, 1919  Transition of Care [facility]) CM/SW Contact  Leitha Bleak, RN Phone Number: 03/27/2023, 2:48 PM  Clinical Narrative:  CM spoke with son to see what the discharge plan. They are requesting HHPT and hospital bed. PT eval is recommending HHPT.  CMS options given. Patient has [employer]. Velna Hatchet with [facility] accepted the referral for HHPT. Referral for hospital bed was sent to [facility] with [employer]. TOC following.    Expected Discharge Plan: Home w Home Health Services Barriers to Discharge: Continued Medical Work up  Expected Discharge Plan and Services      Living arrangements for the past 2 months: Single Family Home                 DME Arranged: Hospital bed DME Agency: [employer] Date DME Agency Contacted: 03/27/23 Time DME Agency Contacted: 1445 Representative spoke with at DME Agency: Ian Malkin     Social Determinants of Health (SDOH) Interventions SDOH Screenings   Food Insecurity: No Food Insecurity (03/17/2023)  Housing: Low Risk  (03/17/2023)  Transportation Needs: No Transportation Needs (03/17/2023)  Utilities: Not At Risk (03/17/2023)  Social Connections: Socially Isolated (03/17/2023)  Tobacco Use: Low Risk  (03/23/2023)    Readmission Risk Interventions     No data to display

## 2023-03-27 NOTE — Plan of Care (Signed)
  Problem: Clinical Measurements: Goal: Ability to maintain clinical measurements within normal limits will improve Outcome: Progressing Goal: Will remain free from infection Outcome: Progressing   Problem: Safety: Goal: Ability to remain free from injury will improve Outcome: Progressing   

## 2023-03-27 NOTE — TOC Progression Note (Signed)
DME Note    Patient Details  Name: Dawn Estrada MRN: [id number] Date of Birth: [birth year]  Transition of Care [facility]) CM/SW Contact  Leitha Bleak, RN Phone Number: 03/27/2023, 1:47 PM  Hospital Bed Patient requires frequent re-positioning of the body in ways that cannot be achieved with  an ordinary bed or wedge pillow, to eliminate pain, reduce pressure, and the head of the  bed to be elevated more than 30 degrees most of the time due to s/p R hemicolectomy and weakness.   Barriers to Discharge: Continued Medical Work up  Expected Discharge Plan and Services    Home with Home Health and DME

## 2023-03-27 NOTE — Progress Notes (Signed)
PROGRESS NOTE   Dawn Estrada  ZOX:[id number] DOB: [birth year] DOA: 03/16/2023 PCP: Kirstie Peri, MD   Chief Complaint  Patient presents with   Emesis   Level of care: Med-Surg  Brief Admission History:  89 y.o. female with medical history significant of essential hypertension, hiatal hernia, hemorrhoids who presents to the emergency department due to several months onset of intermittent abdominal pain which worsened last night.  Patient was admitted for small bowel obstruction.  General surgery consulted with plans for small bowel resection and possible partial colectomy in a.m.  EKG and 2D echocardiogram ordered for preop evaluation pending.  She had fever and further evaluation reveals COVID infection and she has been started on remdesivir.  Fevers are improving and she has now had PICC line placement as well as initiation of TPN on 2/10 and is s/p exploratory lap on 2/14.    Assessment and Plan:  Small bowel obstruction - resolved  CT abdomen and pelvis with contrast showed small bowel obstruction with transition point in the distal ileum where there is an enhancing lesion measuring 1.9 x 0.9 cm. Findings are worrisome for neoplasm. Appreciate general surgery ongoing evaluation, plan for SBR on 2/14 if clinically stable EKG and 2D echocardiogram WNL with preserved EF PICC line and TPN placed for nutrition on 2/10 Pt remains on TPN and tolerating so far, continuing until bowel function returns  CT entero completed 2/12  POD s/p right hemicolectomy with additional ileum - follow postop orders per surgery team appreciate assistance from surgery team NGT removed and having bowel movements  Bowel function returning, started on clears 2/16 and advanced to full liquids on 2/17  Spoke with surgeon, continue full liquids today and continue TPN  Fever and cough - check portable CXR - no acute findings, some atelectasis;  - aspiration precautions - SLP eval as she is HIGH RISK for  aspiration   COVID infection - treated  completed remdesivir 2/12  No significant hypoxemia or findings on chest x-ray Continue ongoing treatment and airborne precautions per hospital protocols   Hyponatremia mild/hypokalemia Na improved to 135 since holding HCTZ Continue to monitor while on TPN Follow with daily TPN labs   Hypoalbuminemia Albumin 3.3, consider protein supplement when patient resumes oral intake Currently on TPN for nutrition and oral full liquid diet  Hypophosphatemia -IV replacement ordered by pharmacy while managing TPN  -recheck testing ordered  Hyperglycemia from TPN -Increased SSI coverage continue frequent CBG monitoring to every 4 hours -added basal insulin - semglee increased to 14 units daily on 2/17 -we will stop insulin when off TPN -DC semglee after today's dose just in case TPN is stopped in the morning and I don't want her to get a dose of semglee if she is off TPN.  If TPN is continued for another day tomorrow my team could add a dose of semglee if needed.  -pharm D added 20 units of insulin to TPN bag today.    CBG (last 3)  Recent Labs    03/27/23 0455 03/27/23 0752 03/27/23 1102  GLUCAP 186* 184* 225*   Essential hypertension Continue IV hydralazine 10 mg every 6 hours as needed for SBP > 170 IV lopressor transitioned back to home oral metoprolol 50 mg BID on 2/18.    Hiatal hernia GERD Continue Protonix for GI protection    Hemorrhoids Continue Preparation H  DVT prophylaxis: SCDs Code Status: Full  Family Communication: long conversation with son Maurine Minister with Dr. Henreitta Leber 2/13, son bedside 2/14,  2/15, 2/16, 2/17, 2/18  Disposition: anticipate need for SNF rehab    Consultants:  Surgery   Procedures:   Antimicrobials:    Subjective: Pt tolerating full liquids and having bowel movements now.  Pain continues to get better as she is healing from surgery.    Objective: Vitals:   03/26/23 0600 03/26/23 2000 03/27/23 0511  03/27/23 0815  BP: (!) 141/55 (!) 150/67 (!) 141/66 121/76  Pulse: (!) 103 100 (!) 104 (!) 108  Resp: 16 16 18    Temp: 99 F (37.2 C) 99.4 F (37.4 C) 99 F (37.2 C)   TempSrc:  Oral Oral   SpO2: 98% 97% 97%   Weight:   60.8 kg   Height:        Intake/Output Summary (Last 24 hours) at 03/27/2023 1128 Last data filed at 03/26/2023 2138 Gross per 24 hour  Intake 1506.8 ml  Output 701 ml  Net 805.8 ml   Filed Weights   03/25/23 0512 03/26/23 0351 03/27/23 0511  Weight: 60.2 kg 68.8 kg 60.8 kg   Examination:  General exam: frail elderly female, sitting up in chair; Appears calm not in distress.  Respiratory system: Clear to auscultation. Respiratory effort normal. Cardiovascular system: normal S1 & S2 heard. No JVD, murmurs, rubs, gallops or clicks. No pedal edema. Gastrointestinal system: Abdomen wounds C/D/I.   bowel sounds heard. Central nervous system: somnolent. No focal neurological deficits. Extremities: Symmetric 5 x 5 power. Skin: No rashes, lesions or ulcers. Psychiatry: Judgement and insight appear UTD. Mood & affect UTD.   Data Reviewed: I have personally reviewed following labs and imaging studies  CBC: Recent Labs  Lab 03/21/23 0438 03/24/23 0420 03/25/23 0432 03/26/23 0425 03/27/23 0257  WBC 4.0 8.4 9.9 8.7 8.2  NEUTROABS  --  6.3  --   --  5.6  HGB 10.8* 11.3* 10.4* 9.4* 9.4*  HCT 32.9* 35.4* 31.1* 29.6* 28.5*  MCV 91.4 91.0 90.4 91.4 92.2  PLT 147* 164 152 151 166    Basic Metabolic Panel: Recent Labs  Lab 03/22/23 0412 03/24/23 0420 03/25/23 0432 03/26/23 0425 03/27/23 0257  NA 134* 132* 134* 135 135  K 3.4* 4.7 4.7 4.5 4.4  CL 107 108 107 109 107  CO2 20* 19* 21* 20* 24  GLUCOSE 149* 252* 253* 236* 180*  BUN 18 22 20 19 20   CREATININE 0.67 0.69 0.71 0.77 0.72  CALCIUM 8.0* 8.3* 8.4* 8.4* 8.7*  MG 1.7 1.9 2.0 1.8 1.8  PHOS 3.0 2.0* 2.3* 2.9 2.2*    CBG: Recent Labs  Lab 03/26/23 2011 03/27/23 0037 03/27/23 0455 03/27/23 0752  03/27/23 1102  GLUCAP 218* 188* 186* 184* 225*    Recent Results (from the past 240 hours)  Resp panel by RT-PCR (RSV, Flu A&B, Covid) Anterior Nasal Swab     Status: Abnormal   Collection Time: 03/19/23  9:30 AM   Specimen: Anterior Nasal Swab  Result Value Ref Range Status   SARS Coronavirus 2 by RT PCR POSITIVE (A) NEGATIVE Final    Comment: (NOTE) SARS-CoV-2 target nucleic acids are DETECTED.  The SARS-CoV-2 RNA is generally detectable in upper respiratory specimens during the acute phase of infection. Positive results are indicative of the presence of the identified virus, but do not rule out bacterial infection or co-infection with other pathogens not detected by the test. Clinical correlation with patient history and other diagnostic information is necessary to determine patient infection status. The expected result is Negative.  Fact Sheet for Patients:  BloggerCourse.com  Fact Sheet for Healthcare Providers: SeriousBroker.it  This test is not yet approved or cleared by the [place] FDA and  has been authorized for detection and/or diagnosis of SARS-CoV-2 by FDA under an Emergency Use Authorization (EUA).  This EUA will remain in effect (meaning this test can be used) for the duration of  the COVID-19 declaration under Section 564(b)(1) of the A ct, 21 U.S.C. section 360bbb-3(b)(1), unless the authorization is terminated or revoked sooner.     Influenza A by PCR NEGATIVE NEGATIVE Final   Influenza B by PCR NEGATIVE NEGATIVE Final    Comment: (NOTE) The Xpert Xpress SARS-CoV-2/FLU/RSV plus assay is intended as an aid in the diagnosis of influenza from Nasopharyngeal swab specimens and should not be used as a sole basis for treatment. Nasal washings and aspirates are unacceptable for Xpert Xpress SARS-CoV-2/FLU/RSV testing.  Fact Sheet for Patients: BloggerCourse.com  Fact Sheet for  Healthcare Providers: SeriousBroker.it  This test is not yet approved or cleared by the [place] FDA and has been authorized for detection and/or diagnosis of SARS-CoV-2 by FDA under an Emergency Use Authorization (EUA). This EUA will remain in effect (meaning this test can be used) for the duration of the COVID-19 declaration under Section 564(b)(1) of the Act, 21 U.S.C. section 360bbb-3(b)(1), unless the authorization is terminated or revoked.     Resp Syncytial Virus by PCR NEGATIVE NEGATIVE Final    Comment: (NOTE) Fact Sheet for Patients: BloggerCourse.com  Fact Sheet for Healthcare Providers: SeriousBroker.it  This test is not yet approved or cleared by the [place] FDA and has been authorized for detection and/or diagnosis of SARS-CoV-2 by FDA under an Emergency Use Authorization (EUA). This EUA will remain in effect (meaning this test can be used) for the duration of the COVID-19 declaration under Section 564(b)(1) of the Act, 21 U.S.C. section 360bbb-3(b)(1), unless the authorization is terminated or revoked.  Performed at [facility], [address] [place], [place] [address]   Culture, blood (Routine X 2) w Reflex to ID Panel     Status: None   Collection Time: 03/19/23  9:47 AM   Specimen: BLOOD  Result Value Ref Range Status   Specimen Description BLOOD BLOOD LEFT FOREARM  Final   Special Requests   Final    BOTTLES DRAWN AEROBIC ONLY Blood Culture adequate volume   Culture   Final    NO GROWTH 5 DAYS Performed at [facility], [address] [place], [place] [address]    Report Status 03/24/2023 FINAL  Final  Culture, blood (Routine X 2) w Reflex to ID Panel     Status: None   Collection Time: 03/19/23  9:52 AM   Specimen: BLOOD  Result Value Ref Range Status   Specimen Description BLOOD BLOOD LEFT HAND  Final   Special Requests   Final    BOTTLES DRAWN AEROBIC ONLY Blood  Culture adequate volume   Culture   Final    NO GROWTH 5 DAYS Performed at [facility], [address] [place], [place] [address]    Report Status 03/24/2023 FINAL  Final     Radiology Studies: DG CHEST PORT 1 VIEW Result Date: 03/26/2023 CLINICAL DATA:  Cough. EXAM: PORTABLE CHEST 1 VIEW COMPARISON:  Chest radiograph dated 03/19/2023. FINDINGS: Right-sided PICC with tip at the cavoatrial junction. Improved aeration of the left lung base. Probable small residual left pleural effusion and associated atelectasis. The right lung is clear. No pneumothorax. Stable cardiac silhouette. Small hiatal hernia.  No acute osseous pathology. IMPRESSION: 1. Improved aeration of the left lung base. Small residual left pleural effusion and associated atelectasis. 2. Small hiatal hernia. Electronically Signed   By: Elgie Collard M.D.   On: 03/26/2023 16:40   Scheduled Meds:  Chlorhexidine Gluconate Cloth  6 each Topical Daily   docusate sodium  100 mg Oral BID   enoxaparin (LOVENOX) injection  30 mg Subcutaneous Q24H   hydrocortisone cream   Topical BID   insulin aspart  0-15 Units Subcutaneous Q4H   insulin aspart  3 Units Subcutaneous TID WC   Latanoprostene Bunod  1 drop Ophthalmic QHS   metoprolol tartrate  7.5 mg Intravenous Q6H   metoprolol tartrate  50 mg Oral BID   pantoprazole (PROTONIX) IV  40 mg Intravenous Q24H   sodium chloride flush  10-40 mL Intracatheter Q12H   timolol  1 drop Both Eyes Daily   Continuous Infusions:  sodium PHOSPHATE IVPB (in mmol) 15 mmol (03/27/23 1046)   TPN ADULT (ION) 65 mL/hr at 03/26/23 1709   TPN ADULT (ION)      LOS: 11 days   Time spent: 55 mins  Fredricka Kohrs Laural Benes, MD How to contact the [facility] Attending or Consulting provider 7A - 7P or covering provider during after hours 7P -7A, for this patient?  Check the care team in [facility] and look for a) attending/consulting TRH provider listed and b) the [facility] team listed Log into [email or web address] to find provider on  call.  Locate the [facility] provider you are looking for under [facility] and page to a number that you can be directly reached. If you still have difficulty reaching the provider, please page the [facility] (Director on Call) for the Hospitalists listed on amion for assistance.  03/27/2023, 11:28 AM

## 2023-03-27 NOTE — Progress Notes (Signed)
Nutrition Follow-up  DOCUMENTATION CODES:   Not applicable  INTERVENTION:   TPN dosing per Pharmacy to meet 100% of nutrition needs with plans to stop TPN tomorrow.  Ensure Enlive po BID, each supplement provides 350 kcal and 20 grams of protein. Magic cup TID with meals, each supplement provides 290 kcal and 9 grams of protein.  NUTRITION DIAGNOSIS:   Inadequate oral intake related to inability to eat as evidenced by NPO status.  Ongoing, diet advancing to dysphagia 1 today.    GOAL:   Patient will meet greater than or equal to 90% of their needs  Met with TPN  MONITOR:   I & O's, Diet advancement  REASON FOR ASSESSMENT:   Consult New TPN/TNA  ASSESSMENT:   89 yo female admitted with SBO, small bowel mass. Tested positive for COVID-19 on 2/10. PMH includes HTN, reflux, hiatal hernia, anxiety, arthritis.  S/P R hemicolectomy 2/14. Pathology was negative for high-grade dysplasia or malignancy.   Patient has been receiving TPN since 2/10. Current TPN at 65 ml/h is providing 1432 kcal and 81 gm protein daily, meeting 99% of minimum kcal needs and 100% of minimum protein needs. Plans to stop TPN tomorrow.   Diet is advancing to dysphagia 1 with thin liquids today. Per discussion with RN, patient has been nauseous this morning, but was able to eat the ice cream on her breakfast tray.   Labs reviewed. Phos 2.2 CBG: [phone]  Medications reviewed and include colace, novolog, protonix, IV sodium phosphate x 1 this morning.   Admit weight 61.7 kg Current weight 60.8 kg   Diet Order:   Diet Order             DIET - DYS 1 Room service appropriate? Yes; Fluid consistency: Thin  Diet effective now                   EDUCATION NEEDS:   No education needs have been identified at this time  Skin:  Skin Assessment: Reviewed RN Assessment  Last BM:  2/18  Height:   Ht Readings from Last 1 Encounters:  03/22/23 5\' 6"  (1.676 m)    Weight:   Wt  Readings from Last 1 Encounters:  03/27/23 60.8 kg    Ideal Body Weight:  59.1 kg  BMI:  Body mass index is 21.63 kg/m.  Estimated Nutritional Needs:   Kcal:  1450-1650  Protein:  75-85 gm  Fluid:  >/= 1.5 L   Gabriel Rainwater RD, LDN, CNSC Contact via secure chat. If unavailable, use group chat "RD Inpatient."

## 2023-03-27 NOTE — Progress Notes (Addendum)
[facility]  Doing fair. Is up to  the edge of the bed. Some BM yesterday. CXR improved. Still coughing. Speech recommended dysphagia I diet. Patient wants to keep on full liquids for now.  BP 121/76   Pulse (!) 108   Temp 99 F (37.2 C) (Oral)   Resp 18   Ht 5\' 6"  (1.676 m)   Wt 60.8 kg   SpO2 97%   BMI 21.63 kg/m  Incision healing, no erythema or drainage, honeycomb in place, binder    Patient s/p R hemicolectomy. 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 Negative for high-grade dysplasia or malignancy.   All reassuring.  Updated Fred on this pathology at the bedside.  Full liquid diet - will up to dysphagia I now that having Bms.  Colace TPN today can likely stop tomorrow  SW working with patient and family regarding HH PT versus SNF  Algis Greenhouse, MD [facility] [address] [place], [place] [address] [phone] (office)

## 2023-03-27 NOTE — Progress Notes (Signed)
PHARMACY - TOTAL PARENTERAL NUTRITION CONSULT NOTE   Indication: Small bowel obstruction  Patient Measurements: Height: 5\' 6"  (167.6 cm) Weight: 60.8 kg (134 lb 0.6 oz) IBW/kg (Calculated) : 59.3 TPN AdjBW (KG): 57.8 Body mass index is 21.63 kg/m.   Assessment:  Patient with a small bowel mass and obstruction. Needs surgery but is now COVID positive. TPN recommended for nutrition for now with SBO which is slowly resolving but also with SB mass for surgery this morning. Will continue TPN for now  Glucose / Insulin: 184-254- 22 units insulin given . MD added semglee 14 units Electrolytes: WNL Renal: Scr 0.71 Hepatic: WNL Albumin 2.6 GI Imaging/procedures: Surgery 12/14 - right hemicolectomy and small bowel resection Central access: PICC TPN start date: 2/10  Nutritional Goals: Goal TPN rate is 65 mL/hr (provides 81 g of protein and 1432 kcals per day)  RD Assessment: Estimated Needs Total Energy Estimated Needs: 1450-1650 Total Protein Estimated Needs: 75-85 gm Total Fluid Estimated Needs: >/= 1.5 L  Current Nutrition:  TPN  Plan:  Sodium phosphate 15 mmol IV x 1 dose  Continue TPN at 65 mL/hr at 1800 Electrolytes in TPN: Na 65 mEq/L, K 71mEq/L, Ca 69mEq/L, Mg 32mEq/L, and Phos 38mmol/L. Cl:Ac 1:2 Add 20 units insulin to TPN Continue to add standard MVI and trace elements to TPN SSI increased to Moderate q6h SSI 2/15, increased requirements overnight, MD to add semglee standing dose Recheck labs in am then monitor TPN labs on Mon/Thurs  Judeth Cornfield, PharmD Clinical Pharmacist 03/27/2023 8:16 AM

## 2023-03-27 NOTE — Plan of Care (Signed)
This pt is progressing well. She has now produced small, medium bowel movements and is passing flatus throughout the night. Pt did have two periods of nausea. Antiemetic given for relief. No pain meds were requested. Pt stated she is not in pain. Will continue to monitor for the remainder of shift for signs of discomfort or changes.    Problem: Clinical Measurements: Goal: Ability to maintain clinical measurements within normal limits will improve Outcome: Progressing Goal: Will remain free from infection Outcome: Progressing   Problem: Safety: Goal: Ability to remain free from injury will improve Outcome: Progressing

## 2023-03-27 NOTE — Addendum Note (Signed)
Addendum  created 03/27/23 0940 by Cy Blamer, CRNA   Attestation recorded in [employer], Intraprocedure Attestations filed

## 2023-03-28 DIAGNOSIS — K56609 Unspecified intestinal obstruction, unspecified as to partial versus complete obstruction: Secondary | ICD-10-CM | POA: Diagnosis not present

## 2023-03-28 DIAGNOSIS — I10 Essential (primary) hypertension: Secondary | ICD-10-CM | POA: Diagnosis not present

## 2023-03-28 DIAGNOSIS — E876 Hypokalemia: Secondary | ICD-10-CM | POA: Diagnosis not present

## 2023-03-28 LAB — GLUCOSE, CAPILLARY
Glucose-Capillary: 171 mg/dL — ABNORMAL HIGH (ref 70–99)
Glucose-Capillary: 166 mg/dL — ABNORMAL HIGH (ref 70–99)
Glucose-Capillary: 196 mg/dL — ABNORMAL HIGH (ref 70–99)
Glucose-Capillary: 250 mg/dL — ABNORMAL HIGH (ref 70–99)
Glucose-Capillary: 140 mg/dL — ABNORMAL HIGH (ref 70–99)

## 2023-03-28 LAB — CBC
HCT: 30.8 % — ABNORMAL LOW (ref 36.0–46.0)
Hemoglobin: 9.6 g/dL — ABNORMAL LOW (ref 12.0–15.0)
MCH: 28.9 pg (ref 26.0–34.0)
MCHC: 31.2 g/dL (ref 30.0–36.0)
MCV: 92.8 fL (ref 80.0–100.0)
Platelets: 159 10*3/uL (ref 150–400)
RBC: 3.32 MIL/uL — ABNORMAL LOW (ref 3.87–5.11)
RDW: 13.7 % (ref 11.5–15.5)
WBC: 9.5 10*3/uL (ref 4.0–10.5)
nRBC: 0 % (ref 0.0–0.2)

## 2023-03-28 LAB — PHOSPHORUS: Phosphorus: 2.5 mg/dL (ref 2.5–4.6)

## 2023-03-28 LAB — BASIC METABOLIC PANEL
Anion gap: 10 (ref 5–15)
BUN: 24 mg/dL — ABNORMAL HIGH (ref 8–23)
CO2: 20 mmol/L — ABNORMAL LOW (ref 22–32)
Calcium: 8.7 mg/dL — ABNORMAL LOW (ref 8.9–10.3)
Chloride: 105 mmol/L (ref 98–111)
Creatinine, Ser: 0.75 mg/dL (ref 0.44–1.00)
GFR, Estimated: >60 mL/min (ref >60)
Glucose, Bld: 179 mg/dL — ABNORMAL HIGH (ref 70–99)
Potassium: 4.6 mmol/L (ref 3.5–5.1)
Sodium: 135 mmol/L (ref 135–145)

## 2023-03-28 LAB — OCCULT BLOOD X 1 CARD TO LAB, STOOL: Fecal Occult Bld: POSITIVE — AB

## 2023-03-28 LAB — MAGNESIUM: Magnesium: 1.7 mg/dL (ref 1.7–2.4)

## 2023-03-28 MED ORDER — MAGNESIUM SULFATE 2 GM/50ML IV SOLN
2.0000 g | Freq: Once | INTRAVENOUS | Status: AC
Start: 2023-03-28 — End: 2023-03-28
  Administered 2023-03-28: 2 g via INTRAVENOUS
  Filled 2023-03-28: qty 50

## 2023-03-28 MED ORDER — OXYCODONE HCL 5 MG PO TABS
5.0000 mg | ORAL_TABLET | ORAL | Status: DC | PRN
  Administered 2023-03-28: 5 mg via ORAL
  Filled 2023-03-28: qty 1

## 2023-03-28 MED ORDER — PANTOPRAZOLE SODIUM 40 MG IV SOLR
40.0000 mg | Freq: Two times a day (BID) | INTRAVENOUS | Status: DC
  Administered 2023-03-28 – 2023-03-29 (×2): 40 mg via INTRAVENOUS
  Filled 2023-03-28 (×2): qty 10

## 2023-03-28 MED ORDER — POTASSIUM PHOSPHATES 15 MMOLE/5ML IV SOLN
15.0000 mmol | Freq: Once | INTRAVENOUS | Status: AC
  Administered 2023-03-28: 15 mmol via INTRAVENOUS
  Filled 2023-03-28: qty 5

## 2023-03-28 MED ORDER — FENTANYL CITRATE PF 50 MCG/ML IJ SOSY
12.5000 ug | PREFILLED_SYRINGE | INTRAMUSCULAR | Status: DC | PRN
  Administered 2023-03-28: 12.5 ug via INTRAVENOUS
  Filled 2023-03-28: qty 1

## 2023-03-28 MED ORDER — ALUM & MAG HYDROXIDE-SIMETH 200-200-20 MG/5ML PO SUSP: 15.0000 mL | Freq: Four times a day (QID) | ORAL | Status: DC | PRN

## 2023-03-28 NOTE — Progress Notes (Addendum)
PROGRESS NOTE  Dawn Estrada:[id number] DOB: 04-15-1919 DOA: 03/16/2023 PCP: Kirstie Peri, MD  Brief History:  89 y.o. female with medical history significant of essential hypertension, hiatal hernia, hemorrhoids who presents to the emergency department due to several months onset of intermittent abdominal pain which worsened last night.  Patient was admitted for small bowel obstruction.  General surgery consulted with plans for small bowel resection and possible partial colectomy in a.m.  EKG and 2D echocardiogram ordered for preop evaluation pending.  She had fever and further evaluation reveals COVID infection and she has been started on remdesivir.  Fevers are improving and she has now had PICC line placement as well as initiation of TPN on 2/10 and is s/p exploratory lap on 2/14.    Assessment/Plan: Small bowel obstruction  -CT abdomen and pelvis with contrast showed small bowel obstruction with transition point in the distal ileum where there is an enhancing lesion measuring 1.9 x 0.9 cm. Findings are worrisome for neoplasm. -s/p right hemicolectomy with SBR 03/23/23 EKG and 2D echocardiogram WNL with preserved EF PICC line and TPN placed for nutrition on 2/10 -Post op TPN-- contined until bowel function returns  POD s/p right hemicolectomy with additional ileum - follow postop orders per surgery team appreciate assistance from surgery team NGT removed and having bowel movements  Bowel function returning, started on clears 2/16 and advanced to full liquids on 2/17  -TPN last day 03/27/23  Heme positive stool -Hgb remains stable last several days -no signs of active blood loss -outpt GI follow up   Fever and cough - personally reviewed CXR--no infiltrates - aspiration precautions - SLP eval as she is HIGH RISK for aspiration >>>dys 1 diet with thin liquids -overall improved   COVID infection - treated  completed remdesivir 2/12  --now stable on RA --airborne  precautions per hospital protocols   Hyponatremia mild/hypokalemia Na improved to 135 since holding HCTZ --monitor BMP   Hypophosphatemia -IV replacement ordered by pharmacy while managing TPN  -give additional Kphos today IV   Hyperglycemia from TPN -Increased SSI coverage continue frequent CBG monitoring to every 4 hours -added basal insulin - semglee increased to 14 units daily on 2/17 -we will stop insulin when off TPN -DC semglee after 2/18 dose as 2/18 was last dayTPN.  If TPN is continued for another day tomorrow my team could add a dose of semglee if needed.  -check A1C  Essential hypertension -IV hydralazine 10 mg every 6 hours as needed for SBP > 170 -IV lopressor transitioned back to home oral metoprolol 50 mg BID on 2/18.    Hiatal hernia GERD Continue Protonix for GI protection  -increase pantoprazole to bid   Hemorrhoids Continue Preparation H      Family Communication:   son at bedside  Consultants:  general surgery  Code Status:  FULL   DVT Prophylaxis:  [place] Lovenox   Procedures: As Listed in Progress Note Above  Antibiotics: None       Subjective: Patient denies fevers, chills, headache, chest pain, dyspnea, nausea, vomiting, diarrhea, abdominal pain, dysuria, hematuria, hematochezia, and melena.   Objective: Vitals:   03/27/23 1331 03/27/23 2040 03/28/23 0403 03/28/23 0547  BP: (!) 148/73 (!) 162/69 121/62   Pulse: 92 (!) 103 97   Resp: 14 18 19    Temp: 98.3 F (36.8 C) 98.9 F (37.2 C) 99 F (37.2 C)   TempSrc: Oral Oral Oral   SpO2:  100% 99% 98%   Weight:    62.6 kg  Height:        Intake/Output Summary (Last 24 hours) at 03/28/2023 1156 Last data filed at 03/28/2023 0546 Gross per 24 hour  Intake 2623.92 ml  Output --  Net 2623.92 ml   Weight change: 1.8 kg Exam:  General:  Pt is alert, follows commands appropriately, not in acute distress HEENT: No icterus, No thrush, No neck mass, [place]/AT Cardiovascular: RRR,  S1/S2, no rubs, no gallops Respiratory: CTA bilaterally, no wheezing, no crackles, no rhonchi Abdomen: Soft/+BS, non tender, non distended, no guarding Extremities: trace LE edema, No lymphangitis, No petechiae, No rashes, no synovitis   Data Reviewed: I have personally reviewed following labs and imaging studies Basic Metabolic Panel: Recent Labs  Lab 03/24/23 0420 03/25/23 0432 03/26/23 0425 03/27/23 0257 03/28/23 0901  NA 132* 134* 135 135 135  K 4.7 4.7 4.5 4.4 4.6  CL 108 107 109 107 105  CO2 19* 21* 20* 24 20*  GLUCOSE 252* 253* 236* 180* 179*  BUN 22 20 19 20  24*  CREATININE 0.69 0.71 0.77 0.72 0.75  CALCIUM 8.3* 8.4* 8.4* 8.7* 8.7*  MG 1.9 2.0 1.8 1.8 1.7  PHOS 2.0* 2.3* 2.9 2.2* 2.5   Liver Function Tests: Recent Labs  Lab 03/22/23 0412 03/26/23 0425  AST 25 44*  ALT 14 29  ALKPHOS 40 36*  BILITOT 0.3 0.5  PROT 5.7* 5.7*  ALBUMIN 2.6* 2.3*   No results for input(s): "LIPASE", "AMYLASE" in the last 168 hours. No results for input(s): "AMMONIA" in the last 168 hours. Coagulation Profile: No results for input(s): "INR", "PROTIME" in the last 168 hours. CBC: Recent Labs  Lab 03/24/23 0420 03/25/23 0432 03/26/23 0425 03/27/23 0257 03/28/23 0901  WBC 8.4 9.9 8.7 8.2 9.5  NEUTROABS 6.3  --   --  5.6  --   HGB 11.3* 10.4* 9.4* 9.4* 9.6*  HCT 35.4* 31.1* 29.6* 28.5* 30.8*  MCV 91.0 90.4 91.4 92.2 92.8  PLT 164 152 151 166 159   Cardiac Enzymes: No results for input(s): "CKTOTAL", "CKMB", "CKMBINDEX", "TROPONINI" in the last 168 hours. BNP: Invalid input(s): "POCBNP" CBG: Recent Labs  Lab 03/27/23 2037 03/27/23 2356 03/28/23 0310 03/28/23 0724 03/28/23 1117  GLUCAP 209* 192* 171* 166* 196*   HbA1C: No results for input(s): "HGBA1C" in the last 72 hours. Urine analysis:    Component Value Date/Time   COLORURINE YELLOW 03/20/2023 0600   APPEARANCEUR CLEAR 03/20/2023 0600   LABSPEC 1.016 03/20/2023 0600   PHURINE 5.0 03/20/2023 0600    GLUCOSEU NEGATIVE 03/20/2023 0600   HGBUR NEGATIVE 03/20/2023 0600   BILIRUBINUR NEGATIVE 03/20/2023 0600   KETONESUR 5 (A) 03/20/2023 0600   PROTEINUR NEGATIVE 03/20/2023 0600   UROBILINOGEN 0.2 11/25/2014 1230   NITRITE NEGATIVE 03/20/2023 0600   LEUKOCYTESUR LARGE (A) 03/20/2023 0600   Sepsis Labs: @LABRCNTIP (procalcitonin:4,lacticidven:4) ) Recent Results (from the past 240 hours)  Resp panel by RT-PCR (RSV, Flu A&B, Covid) Anterior Nasal Swab     Status: Abnormal   Collection Time: 03/19/23  9:30 AM   Specimen: Anterior Nasal Swab  Result Value Ref Range Status   SARS Coronavirus 2 by RT PCR POSITIVE (A) NEGATIVE Final    Comment: (NOTE) SARS-CoV-2 target nucleic acids are DETECTED.  The SARS-CoV-2 RNA is generally detectable in upper respiratory specimens during the acute phase of infection. Positive results are indicative of the presence of the identified virus, but do not rule out bacterial infection or  co-infection with other pathogens not detected by the test. Clinical correlation with patient history and other diagnostic information is necessary to determine patient infection status. The expected result is Negative.  Fact Sheet for Patients: BloggerCourse.com  Fact Sheet for Healthcare Providers: SeriousBroker.it  This test is not yet approved or cleared by the [place] FDA and  has been authorized for detection and/or diagnosis of SARS-CoV-2 by FDA under an Emergency Use Authorization (EUA).  This EUA will remain in effect (meaning this test can be used) for the duration of  the COVID-19 declaration under Section 564(b)(1) of the A ct, 21 U.S.C. section 360bbb-3(b)(1), unless the authorization is terminated or revoked sooner.     Influenza A by PCR NEGATIVE NEGATIVE Final   Influenza B by PCR NEGATIVE NEGATIVE Final    Comment: (NOTE) The Xpert Xpress SARS-CoV-2/FLU/RSV plus assay is intended as an  aid in the diagnosis of influenza from Nasopharyngeal swab specimens and should not be used as a sole basis for treatment. Nasal washings and aspirates are unacceptable for Xpert Xpress SARS-CoV-2/FLU/RSV testing.  Fact Sheet for Patients: BloggerCourse.com  Fact Sheet for Healthcare Providers: SeriousBroker.it  This test is not yet approved or cleared by the [place] FDA and has been authorized for detection and/or diagnosis of SARS-CoV-2 by FDA under an Emergency Use Authorization (EUA). This EUA will remain in effect (meaning this test can be used) for the duration of the COVID-19 declaration under Section 564(b)(1) of the Act, 21 U.S.C. section 360bbb-3(b)(1), unless the authorization is terminated or revoked.     Resp Syncytial Virus by PCR NEGATIVE NEGATIVE Final    Comment: (NOTE) Fact Sheet for Patients: BloggerCourse.com  Fact Sheet for Healthcare Providers: SeriousBroker.it  This test is not yet approved or cleared by the [place] FDA and has been authorized for detection and/or diagnosis of SARS-CoV-2 by FDA under an Emergency Use Authorization (EUA). This EUA will remain in effect (meaning this test can be used) for the duration of the COVID-19 declaration under Section 564(b)(1) of the Act, 21 U.S.C. section 360bbb-3(b)(1), unless the authorization is terminated or revoked.  Performed at [facility], [address], [place], [place] [address]   Culture, blood (Routine X 2) w Reflex to ID Panel     Status: None   Collection Time: 03/19/23  9:47 AM   Specimen: BLOOD  Result Value Ref Range Status   Specimen Description BLOOD BLOOD LEFT FOREARM  Final   Special Requests   Final    BOTTLES DRAWN AEROBIC ONLY Blood Culture adequate volume   Culture   Final    NO GROWTH 5 DAYS Performed at [facility], [address] [place], [place] [address]    Report  Status 03/24/2023 FINAL  Final  Culture, blood (Routine X 2) w Reflex to ID Panel     Status: None   Collection Time: 03/19/23  9:52 AM   Specimen: BLOOD  Result Value Ref Range Status   Specimen Description BLOOD BLOOD LEFT HAND  Final   Special Requests   Final    BOTTLES DRAWN AEROBIC ONLY Blood Culture adequate volume   Culture   Final    NO GROWTH 5 DAYS Performed at [facility], [address] [place], [place] [address]    Report Status 03/24/2023 FINAL  Final     Scheduled Meds:  Chlorhexidine Gluconate Cloth  6 each Topical Daily   enoxaparin (LOVENOX) injection  30 mg Subcutaneous Q24H   feeding supplement  237 mL  Oral BID BM   hydrocortisone cream   Topical BID   insulin aspart  0-15 Units Subcutaneous Q4H   insulin aspart  3 Units Subcutaneous TID WC   Latanoprostene Bunod  1 drop Ophthalmic QHS   metoprolol tartrate  50 mg Oral BID   pantoprazole (PROTONIX) IV  40 mg Intravenous Q24H   sodium chloride flush  10-40 mL Intracatheter Q12H   timolol  1 drop Both Eyes Daily   Continuous Infusions:  TPN ADULT (ION) 65 mL/hr at 03/27/23 1742    Procedures/Studies: DG CHEST PORT 1 VIEW Result Date: 03/26/2023 CLINICAL DATA:  Cough. EXAM: PORTABLE CHEST 1 VIEW COMPARISON:  Chest radiograph dated 03/19/2023. FINDINGS: Right-sided PICC with tip at the cavoatrial junction. Improved aeration of the left lung base. Probable small residual left pleural effusion and associated atelectasis. The right lung is clear. No pneumothorax. Stable cardiac silhouette. Small hiatal hernia. No acute osseous pathology. IMPRESSION: 1. Improved aeration of the left lung base. Small residual left pleural effusion and associated atelectasis. 2. Small hiatal hernia. Electronically Signed   By: Elgie Collard M.D.   On: 03/26/2023 16:40   DG Chest Port 1 View Result Date: 03/23/2023 CLINICAL DATA:  Nasogastric tube placement. EXAM: PORTABLE CHEST 1 VIEW COMPARISON:  None Available. FINDINGS: Image  focused on the lower chest and upper abdomen for enteric tube placement. Tip and side port of the enteric tube below the diaphragm in the region of the mid stomach. Skin staples in the upper abdomen partially included. IMPRESSION: Tip and side port of the enteric tube below the diaphragm in the region of the mid stomach. Electronically Signed   By: Narda Rutherford M.D.   On: 03/23/2023 15:13   CT ENTERO ABD/PELVIS W CONTAST Result Date: 03/21/2023 CLINICAL DATA:  Small bowel mass, preop. EXAM: CT ABDOMEN AND PELVIS WITH CONTRAST (ENTEROGRAPHY) TECHNIQUE: Multidetector CT of the abdomen and pelvis during bolus administration of intravenous contrast. Negative oral contrast was given. RADIATION DOSE REDUCTION: This exam was performed according to the departmental dose-optimization program which includes automated exposure control, adjustment of the mA and/or kV according to patient size and/or use of iterative reconstruction technique. CONTRAST:  OMNIPAQUE IOHEXOL 300 MG/ML  SOLN COMPARISON:  03/16/2023. FINDINGS: Lower chest: Trace left pleural fluid. Scarring in the lingula. Heart is at the upper limits of normal in size to mildly enlarged. No pericardial effusion. Moderate hiatal hernia. Hepatobiliary: Hepatic cysts. No specific follow-up necessary. Cholecystectomy. No unexpected biliary ductal dilatation. Pancreas: Negative. Spleen: Negative. Adrenals/Urinary Tract: Adrenal glands are unremarkable. Bilateral renal cysts. No specific follow-up necessary. Ureters are decompressed. Bladder is grossly unremarkable. Stomach/Bowel: Moderate hiatal hernia. Enhancing lesions seen in the distal small bowel on 03/16/2023 is not as well visualized. There may be a sessile lesion in the distal small bowel measuring 0.8 x 2.1 cm (3/138). Small bowel and colon are otherwise unremarkable. Appendix is not readily visualized. Vascular/Lymphatic: Atherosclerotic calcification of the aorta. No pathologically enlarged lymph  nodes. Reproductive: Hysterectomy.  No adnexal mass. Other: Minimal presacral edema. No free fluid. Mesenteries and peritoneum are unremarkable. Musculoskeletal: Degenerative changes in the spine. IMPRESSION: 1. Possible sessile lesion in the distal small bowel to correspond to the questioned lesion on 03/16/2023. 2. Trace left pleural fluid. 3. Moderate hiatal hernia. 4.  Aortic atherosclerosis (ICD10-I70.0). Electronically Signed   By: Leanna Battles M.D.   On: 03/21/2023 17:33   DG Chest 1 View Result Date: 03/19/2023 CLINICAL DATA:  PICC line placement EXAM: CHEST  1 VIEW  COMPARISON:  03/19/2023 FINDINGS: Single frontal view of the chest demonstrates right-sided PICC tip overlying superior vena cava. The cardiac silhouette is stable. Hiatal hernia again noted. Stable small left pleural effusion, with patchy left basilar consolidation. Right chest is clear. No pneumothorax. IMPRESSION: 1. Right-sided PICC line, tip overlying superior vena cava. 2. Small left pleural effusion, with patchy left basilar consolidation likely atelectasis. 3. Stable hiatal hernia. Electronically Signed   By: Sharlet Salina M.D.   On: 03/19/2023 22:43   DG CHEST PORT 1 VIEW Result Date: 03/19/2023 CLINICAL DATA:  Fever EXAM: PORTABLE CHEST 1 VIEW COMPARISON:  05/18/20 CXR. FINDINGS: Small left pleural effusion. No consolidation or pneumothorax or edema. Degenerative changes of the spine. Normal cardiopericardial shadow is preserved. IMPRESSION: Small left effusion. Electronically Signed   By: Karen Kays M.D.   On: 03/19/2023 14:19   ECHOCARDIOGRAM COMPLETE Result Date: 03/19/2023    ECHOCARDIOGRAM REPORT   Patient Name:   KAYANA THOEN Date of Exam: 03/19/2023 Medical Rec #:  [id number]         Height:       66.0 in Accession #:    [id number]        Weight:       136.0 lb Date of Birth:  1919-03-04         BSA:          1.697 m Patient Age:    89 years         BP:           131/61 mmHg Patient Gender: F                 HR:            92 bpm. [facility] Procedure: 2D Echo, Cardiac Doppler and Color Doppler Indications:    Pre-OP clearance  History:        Patient has prior history of Echocardiogram examinations, most                 recent 11/26/2014. Risk Factors:Hypertension. GERD.  Sonographer:    Celesta Gentile RCS Referring Phys: [id number] PRATIK D [facility] IMPRESSIONS  1. Left ventricular intracavitary gradient of 30 mm Hg ia noted at rest likely secondary to hyperdynamic left ventricle. Left ventricular ejection fraction, by estimation, is >75%. The left ventricle has hyperdynamic function. The left ventricle has no regional wall motion abnormalities. There is mild left ventricular hypertrophy. Left ventricular diastolic parameters are consistent with Grade I diastolic dysfunction (impaired relaxation). Elevated left ventricular end-diastolic pressure.  2. Right ventricular systolic function is normal. The right ventricular size is normal. Tricuspid regurgitation signal is inadequate for assessing PA pressure.  3. Left atrial size was moderately dilated.  4. The mitral valve is normal in structure. Trivial mitral valve regurgitation. No evidence of mitral stenosis.  5. The aortic valve is tricuspid. Aortic valve regurgitation is not visualized. No aortic stenosis is present.  6. The inferior vena cava is normal in size with greater than 50% respiratory variability, suggesting right atrial pressure of 3 mmHg.  7. Increased flow velocities may be secondary to anemia, thyrotoxicosis, hyperdynamic or high flow state. Comparison(s): No prior Echocardiogram. FINDINGS  Left Ventricle: Left ventricular ejection fraction, by estimation, is >75%. The left ventricle has hyperdynamic function. The left ventricle has no regional wall motion abnormalities. The left ventricular internal cavity size was normal in size. There is mild left ventricular hypertrophy. Left ventricular diastolic parameters are consistent with Grade  I diastolic  dysfunction (impaired relaxation). Elevated left ventricular end-diastolic pressure. Right Ventricle: The right ventricular size is normal. No increase in right ventricular wall thickness. Right ventricular systolic function is normal. Tricuspid regurgitation signal is inadequate for assessing PA pressure. Left Atrium: Left atrial size was moderately dilated. Right Atrium: Right atrial size was normal in size. Pericardium: There is no evidence of pericardial effusion. Mitral Valve: The mitral valve is normal in structure. Trivial mitral valve regurgitation. No evidence of mitral valve stenosis. Tricuspid Valve: The tricuspid valve is normal in structure. Tricuspid valve regurgitation is not demonstrated. No evidence of tricuspid stenosis. Aortic Valve: The aortic valve is tricuspid. Aortic valve regurgitation is not visualized. No aortic stenosis is present. Pulmonic Valve: The pulmonic valve was normal in structure. Pulmonic valve regurgitation is trivial. No evidence of pulmonic stenosis. Aorta: The aortic root is normal in size and structure. Venous: The inferior vena cava is normal in size with greater than 50% respiratory variability, suggesting right atrial pressure of 3 mmHg. IAS/Shunts: No atrial level shunt detected by color flow Doppler.  LEFT VENTRICLE PLAX 2D LVIDd:         3.40 cm   Diastology LVIDs:         1.80 cm   LV e' medial:    4.79 cm/s LV PW:         1.10 cm   LV E/e' medial:  22.3 LV IVS:        1.20 cm   LV e' lateral:   6.64 cm/s LVOT diam:     1.70 cm   LV E/e' lateral: 16.1 LV SV:         67 LV SV Index:   39 LVOT Area:     2.27 cm  RIGHT VENTRICLE RV S prime:     17.40 cm/s TAPSE (M-mode): 2.3 cm LEFT ATRIUM             Index        RIGHT ATRIUM           Index LA diam:        3.20 cm 1.89 cm/m   RA Area:     14.20 cm LA Vol (A2C):   66.3 ml 39.06 ml/m  RA Volume:   32.50 ml  19.15 ml/m LA Vol (A4C):   72.4 ml 42.65 ml/m LA Biplane Vol: 69.1 ml 40.71 ml/m  AORTIC VALVE LVOT Vmax:    160.00 cm/s LVOT Vmean:  120.000 cm/s LVOT VTI:    0.295 m  AORTA Ao Root diam: 3.00 cm MITRAL VALVE MV Area (PHT): 2.48 cm     SHUNTS MV Decel Time: 306 msec     Systemic VTI:  0.30 m MV E velocity: 107.00 cm/s  Systemic Diam: 1.70 cm MV A velocity: 143.00 cm/s MV E/A ratio:  0.75 Vishnu Priya Mallipeddi Electronically signed by Winfield Rast Mallipeddi Signature Date/Time: 03/19/2023/1:05:47 PM    Final    [place] EKG SITE RITE Result Date: 03/19/2023 If Site Rite image not attached, placement could not be confirmed due to current cardiac rhythm.  CT ABDOMEN PELVIS W CONTRAST Result Date: 03/16/2023 CLINICAL DATA:  Acute abdominal pain EXAM: CT ABDOMEN AND PELVIS WITH CONTRAST TECHNIQUE: Multidetector CT imaging of the abdomen and pelvis was performed using the standard protocol following bolus administration of intravenous contrast. RADIATION DOSE REDUCTION: This exam was performed according to the departmental dose-optimization program which includes automated exposure control, adjustment of the mA and/or kV according to patient size  and/or use of iterative reconstruction technique. CONTRAST:  OMNIPAQUE IOHEXOL 300 MG/ML  SOLN COMPARISON:  CT abdomen and pelvis 01/09/2021 FINDINGS: Lower chest: There is a trace left pleural effusion. Hepatobiliary: There are scattered hepatic cysts measuring up to 2.5 cm similar to the prior study. Gallbladder is not seen. There is no biliary ductal dilatation. Pancreas: Unremarkable. No pancreatic ductal dilatation or surrounding inflammatory changes. Spleen: Normal in size without focal abnormality. Adrenals/Urinary Tract: Bilateral renal cysts are present. The largest is in the left kidney measuring 3.7 cm. There is no hydronephrosis or perinephric fat stranding. The adrenal glands and bladder are within normal limits. Stomach/Bowel: There some mildly dilated and borderline dilated small bowel loops with air-fluid levels involving the mid small bowel. Transition point is  seen in the distal ileum on image 2/59 where there is an enhancing lesion in the small bowel measuring 1.9 x 0.9 cm image 2/59. Terminal ileum is decompressed. Mild mesenteric edema present. Air-fluid levels are seen throughout small bowel. There is a moderate-sized hiatal hernia. The stomach is decompressed. The colon is nondilated. The appendix is not well seen. There is no pneumatosis or free air. Vascular/Lymphatic: Aortic atherosclerosis. No enlarged abdominal or pelvic lymph nodes. Reproductive: Status post hysterectomy. No adnexal masses. Other: No ascites.  There is presacral edema.  There is no free air. Musculoskeletal: No fracture is seen. IMPRESSION: 1. Small-bowel obstruction with transition point in the distal ileum where there is an enhancing lesion measuring 1.9 x 0.9 cm. Findings are worrisome for neoplasm. 2. Mild mesenteric edema. No pneumatosis or free air. 3. Trace left pleural effusion. 4. Hepatic and renal cysts. No follow-up imaging recommended. 5. Moderate-sized hiatal hernia. 6. Aortic atherosclerosis. Aortic Atherosclerosis (ICD10-I70.0). Electronically Signed   By: Darliss Cheney M.D.   On: 03/16/2023 18:37    Catarina Hartshorn, DO  [facility]  If 7PM-7AM, please contact night-coverage [email or web address] Password [email or web address] 03/28/2023, 11:56 AM   LOS: 12 days

## 2023-03-28 NOTE — Progress Notes (Signed)
Speech Language Pathology Treatment: Dysphagia  Patient Details Name: Dawn Estrada MRN: [id number] DOB: Nov 01, 1919 Today's Date: 03/28/2023 Time: 1210-1232 SLP Time Calculation (min) (ACUTE ONLY): 22 min  Assessment / Plan / Recommendation Clinical Impression  Pt seen for ongoing dysphagia intervention. Family present. Pt seen sitting up in chair and she consumed cup/straw sips thin water, diced peaches, and a peanut butter cracker. Pt with slow oral transit and prolonged mastication of solids but no overt signs or symptoms of aspiration and no reports of globus. Pt states that she prefers to take her medications whole with water and this was relayed to RN. Ok from dysphagia standpoint to advance to regular textures and thin liquids with standard aspiration and reflux precautions if ok from surgical standpoint.    HPI HPI: 89 y.o. female with medical history significant of essential hypertension, hiatal hernia, hemorrhoids who presents to the emergency department due to several months onset of intermittent abdominal pain which worsened last night.  Patient was admitted for small bowel obstruction.  General surgery consulted with plans for small bowel resection and possible partial colectomy in a.m.  EKG and 2D echocardiogram ordered for preop evaluation pending.  She had fever and further evaluation reveals COVID infection and she has been started on remdesivir.  Fevers are improving and she has now had PICC line placement as well as initiation of TPN on 2/10 and is s/p exploratory lap on 2/14. BSE requested. Chest xray shows:  Improved aeration of the left lung base. Small residual left  pleural effusion and associated atelectasis.  2. Small hiatal hernia.      SLP Plan  Continue with current plan of care      Recommendations for follow up therapy are one component of a multi-disciplinary discharge planning process, led by the attending physician.  Recommendations may be updated based on  patient status, additional functional criteria and insurance authorization.    Recommendations  Diet recommendations: Regular;Thin liquid Liquids provided via: Cup;Straw Medication Administration: Whole meds with liquid Supervision: Staff to assist with self feeding Compensations: Slow rate;Small sips/bites Postural Changes and/or Swallow Maneuvers: Seated upright 90 degrees;Upright 30-60 min after meal                  Oral care BID;Staff/trained caregiver to provide oral care   None Dysphagia, unspecified (R13.10)     Continue with current plan of care    Thank you,  Havery Moros, CCC-SLP [phone]  Brannon Levene  03/28/2023, 12:56 PM

## 2023-03-28 NOTE — Progress Notes (Signed)
[facility]  Doing fair. Having multiple Bms. Overall feeling ok. Dysphagia diet. [name] says one BM was dark last night. Hemoccult was suggested but not done yet.  Blood pressure 121/62, pulse 97, temperature 99 F (37.2 C), temperature source Oral, resp. rate 19, height 5\' 6"  (1.676 m), weight 62.6 kg, SpO2 98%. Soft, nondistended, staples c/d/I with staples   Patient s/p R hemicolectomy and SBR for mass in the SB. Mass was pseudocyst.   PRN for pain IS, OOB Dysphagia I diet ordered TPN can stop Colace stopped  H&H had been stable, ? Dark stool  Updated Dr. Arbutus Leas Would think potentially ready for dc home in next 24-48 hours   Dawn Greenhouse, MD [facility] [address]ella Raring [place], [place] [address] [phone] (office)

## 2023-03-28 NOTE — Discharge Instructions (Signed)
Discharge Open Abdominal Surgery Instructions:  Common Complaints: Pain at the incision site is common. This will improve with time. Take your pain medications as described below. Some nausea is common and poor appetite. The main goal is to stay hydrated the first few days after surgery.   Diet/ Activity: Diet as tolerated. You have started and tolerated a diet in the hospital, and should continue to increase what you are able to eat.   You may not have a large appetite, but it is important to stay hydrated. Drink 64 ounces of water a day. Your appetite will return with time.  Shower per your regular routine daily.  Do not take hot showers. Take warm showers that are less than 10 minutes. Path the incision dry. Wear an abdominal binder daily with activity. You do not have to wear this while sleeping or sitting.  Rest and listen to your body, but do not remain in bed all day.  Walk everyday for at least 15-20 minutes. Deep cough and move around every 1-2 hours in the first few days after surgery.  Do not lift > 10 lbs, perform excessive bending, pushing, pulling, squatting for 6-8 weeks after surgery.  The activity restrictions and the abdominal binder are to prevent hernia formation at your incision while you are healing.  Do not place lotions or balms on your incision unless instructed to specifically by Dr. Henreitta Leber.   Pain Expectations and Narcotics: -After surgery you will have pain associated with your incisions and this is normal. The pain is muscular and nerve pain, and will get better with time. -You are encouraged and expected to take non narcotic medications like tylenol and ibuprofen (when able) to treat pain as multiple modalities can aid with pain treatment. -Narcotics are only used when pain is severe or there is breakthrough pain. -You are not expected to have a pain score of 0 after surgery, as we cannot prevent pain. A pain score of 3-4 that allows you to be functional, move,  walk, and tolerate some activity is the goal. The pain will continue to improve over the days after surgery and is dependent on your surgery. -Due to [place] law, we are only able to give a certain amount of pain medication to treat post operative pain, and we only give additional narcotics on a patient by patient basis.  -For most laparoscopic surgery, studies have shown that the majority of patients only need 10-15 narcotic pills, and for open surgeries most patients only need 15-20.   -Having appropriate expectations of pain and knowledge of pain management with non narcotics is important as we do not want anyone to become addicted to narcotic pain medication.  -Using ice packs in the first 48 hours and heating pads after 48 hours, wearing an abdominal binder (when recommended), and using over the counter medications are all ways to help with pain management.   -Simple acts like meditation and mindfulness practices after surgery can also help with pain control and research has proven the benefit of these practices.  Medication: Take tylenol and ibuprofen as needed for pain control, alternating every 4-6 hours.  Example:  Tylenol 1000mg  @ 6am, 12noon, 6pm, (Do not exceed 4000mg  of tylenol a day). Ibuprofen 800mg  @ 9am, 3pm, 9pm, 3am (Do not exceed 3600mg  of ibuprofen a day).  Take Roxicodone for breakthrough pain every 4 hours.  Take Colace for constipation related to narcotic pain medication. If you do not have a bowel movement in 2 days, take  Miralax over the counter.  Drink plenty of water to also prevent constipation.   Contact Information: If you have questions or concerns, please call our office, [phone], Monday- Thursday 8AM-5PM and Friday 8AM-12Noon.  If it is after hours or on the weekend, please call [facility], [phone], [phone], and ask to speak to the surgeon on call for Dr. Henreitta Leber at [facility].

## 2023-03-28 NOTE — Care Management Important Message (Signed)
Important Message  Patient Details  Name: Dawn Estrada MRN: [id number] Date of Birth: Dec 19, 1919   Important Message Given:  Yes - Medicare IM     Corey Harold 03/28/2023, 11:31 AM

## 2023-03-29 DIAGNOSIS — K56609 Unspecified intestinal obstruction, unspecified as to partial versus complete obstruction: Secondary | ICD-10-CM | POA: Diagnosis not present

## 2023-03-29 DIAGNOSIS — E876 Hypokalemia: Secondary | ICD-10-CM | POA: Diagnosis not present

## 2023-03-29 DIAGNOSIS — I10 Essential (primary) hypertension: Secondary | ICD-10-CM | POA: Diagnosis not present

## 2023-03-29 LAB — CBC
HCT: 28.9 % — ABNORMAL LOW (ref 36.0–46.0)
Hemoglobin: 9.1 g/dL — ABNORMAL LOW (ref 12.0–15.0)
MCH: 28.7 pg (ref 26.0–34.0)
MCHC: 31.5 g/dL (ref 30.0–36.0)
MCV: 91.2 fL (ref 80.0–100.0)
Platelets: 183 10*3/uL (ref 150–400)
RBC: 3.17 MIL/uL — ABNORMAL LOW (ref 3.87–5.11)
RDW: 13.6 % (ref 11.5–15.5)
WBC: 11.6 10*3/uL — ABNORMAL HIGH (ref 4.0–10.5)
nRBC: 0.2 % (ref 0.0–0.2)

## 2023-03-29 LAB — COMPREHENSIVE METABOLIC PANEL
ALT: 68 U/L — ABNORMAL HIGH (ref 0–44)
AST: 73 U/L — ABNORMAL HIGH (ref 15–41)
Albumin: 2.2 g/dL — ABNORMAL LOW (ref 3.5–5.0)
Alkaline Phosphatase: 48 U/L (ref 38–126)
Anion gap: 9 (ref 5–15)
BUN: 22 mg/dL (ref 8–23)
CO2: 24 mmol/L (ref 22–32)
Calcium: 8.5 mg/dL — ABNORMAL LOW (ref 8.9–10.3)
Chloride: 103 mmol/L (ref 98–111)
Creatinine, Ser: 0.73 mg/dL (ref 0.44–1.00)
GFR, Estimated: >60 mL/min (ref >60)
Glucose, Bld: 84 mg/dL (ref 70–99)
Potassium: 5.2 mmol/L — ABNORMAL HIGH (ref 3.5–5.1)
Sodium: 136 mmol/L (ref 135–145)
Total Bilirubin: 0.5 mg/dL (ref 0.0–1.2)
Total Protein: 5.8 g/dL — ABNORMAL LOW (ref 6.5–8.1)

## 2023-03-29 LAB — GLUCOSE, CAPILLARY
Glucose-Capillary: 111 mg/dL — ABNORMAL HIGH (ref 70–99)
Glucose-Capillary: 82 mg/dL (ref 70–99)
Glucose-Capillary: 82 mg/dL (ref 70–99)
Glucose-Capillary: 87 mg/dL (ref 70–99)

## 2023-03-29 LAB — MAGNESIUM: Magnesium: 2.1 mg/dL (ref 1.7–2.4)

## 2023-03-29 LAB — PHOSPHORUS: Phosphorus: 3.2 mg/dL (ref 2.5–4.6)

## 2023-03-29 MED ORDER — OXYCODONE HCL 5 MG PO TABS
5.0000 mg | ORAL_TABLET | ORAL | 0 refills | Status: AC | PRN
Start: 2023-03-29 — End: ?

## 2023-03-29 MED ORDER — ONDANSETRON 4 MG PO TBDP: 4.0000 mg | ORAL_TABLET | Freq: Three times a day (TID) | ORAL | 0 refills | Status: AC | PRN

## 2023-03-29 MED ORDER — SODIUM ZIRCONIUM CYCLOSILICATE 10 G PO PACK
10.0000 g | PACK | Freq: Once | ORAL | Status: AC
  Administered 2023-03-29: 10 g via ORAL
  Filled 2023-03-29: qty 1

## 2023-03-29 MED ORDER — ORAL CARE MOUTH RINSE: 15.0000 mL | OROMUCOSAL | Status: DC | PRN

## 2023-03-29 MED ORDER — METOPROLOL TARTRATE 50 MG PO TABS
50.0000 mg | ORAL_TABLET | Freq: Two times a day (BID) | ORAL | 2 refills | Status: AC
Start: 2023-03-29 — End: ?

## 2023-03-29 NOTE — Progress Notes (Signed)
Physical Therapy Treatment Patient Details Name: Dawn Estrada MRN: [id number] DOB: [birth year] Today's Date: 03/29/2023   History of Present Illness PEr MD: Dawn Estrada is a 89 y.o. female with medical history significant of essential hypertension, hiatal hernia, hemorrhoids who presents to the emergency department due to several months onset of intermittent abdominal pain which worsened last night.  Pain was mid abdominal and was rated as 10/10 on pain scale, this was associated with nausea, vomiting and some diarrhea last night.  There was no vomiting or diarrhea today.  Family was worried about dehydration and she was taken to the ED for further evaluation and management.    PT Comments  Patient presents seated on BSC (assisted by nursing staff) and agreeable for therapy.  Patient demonstrates fair carryover for log rolling technique for getting into/out of bed requiring assistance for moving legs and had most difficulty getting into bed due to abdominal discomfort/pain.  Patient able to ambulate into hallway withslow labored movement without loss of balance, limited mostly due to fatigue and requested to go back to bed after therapy due to fatigue with her son present in room. Patient will benefit from continued skilled physical therapy in hospital and recommended venue below to increase strength, balance, endurance for safe ADLs and gait.    If plan is discharge home, recommend the following: A little help with bathing/dressing/bathroom;Help with stairs or ramp for entrance;Assistance with cooking/housework;A little help with walking and/or transfers   Can travel by private vehicle        Equipment Recommendations  None recommended by PT    Recommendations for Other Services       Precautions / Restrictions Precautions Precautions: Fall Restrictions Weight Bearing Restrictions Per Provider Order: No     Mobility  Bed Mobility Overal bed mobility: Needs Assistance Bed  Mobility: Rolling, Sit to Sidelying, Sidelying to Sit Rolling: Min assist, Mod assist Sidelying to sit: Min assist     Sit to sidelying: Mod assist General bed mobility comments: slow labored movement with difficulty moving BLE onto bed during sit to side lying    Transfers Overall transfer level: Needs assistance Equipment used: Rolling walker (2 wheels) Transfers: Sit to/from Stand, Bed to chair/wheelchair/BSC Sit to Stand: Min assist   Step pivot transfers: Min assist       General transfer comment: increased time, labored movement with c/o abdominal disconfort    Ambulation/Gait Ambulation/Gait assistance: Contact guard assist, Min assist Gait Distance (Feet): 35 Feet Assistive device: Rolling walker (2 wheels) Gait Pattern/deviations: Decreased step length - right, Decreased step length - left, Decreased stride length, Trunk flexed Gait velocity: decreased     General Gait Details: slow labored cadence without loss of balance and limited mostly due to fatigue and c/o nausea   Stairs             Wheelchair Mobility     Tilt Bed    Modified Rankin (Stroke Patients Only)       Balance Overall balance assessment: Needs assistance Sitting-balance support: Feet supported, No upper extremity supported Sitting balance-Leahy Scale: Fair Sitting balance - Comments: fair/good seated at EOB   Standing balance support: Reliant on assistive device for balance, During functional activity, Bilateral upper extremity supported Standing balance-Leahy Scale: Fair Standing balance comment: using RW                            Communication Communication Communication: No apparent difficulties  Cognition  Arousal: Alert Behavior During Therapy: WFL for tasks assessed/performed   PT - Cognitive impairments: No apparent impairments                         Following commands: Intact      Cueing Cueing Techniques: Verbal cues, Tactile cues   Exercises      General Comments        Pertinent Vitals/Pain Pain Assessment Pain Assessment: Faces Faces Pain Scale: Hurts a little bit Pain Location: abdominal Pain Descriptors / Indicators: Discomfort, Sore Pain Intervention(s): Limited activity within patient's tolerance, Monitored during session, Repositioned    Home Living                          Prior Function            PT Goals (current goals can now be found in the care plan section) Acute Rehab PT Goals Patient Stated Goal: to go home PT Goal Formulation: With patient/family Time For Goal Achievement: 03/28/23 Potential to Achieve Goals: Good Progress towards PT goals: Progressing toward goals    Frequency    Min 3X/week      PT Plan      Co-evaluation              AM-PAC PT "6 Clicks" Mobility   Outcome Measure  Help needed turning from your back to your side while in a flat bed without using bedrails?: A Little Help needed moving from lying on your back to sitting on the side of a flat bed without using bedrails?: A Lot Help needed moving to and from a bed to a chair (including a wheelchair)?: A Little Help needed standing up from a chair using your arms (e.g., wheelchair or bedside chair)?: A Little Help needed to walk in hospital room?: A Little Help needed climbing 3-5 steps with a railing? : A Lot 6 Click Score: 16    End of Session   Activity Tolerance: Patient tolerated treatment well;Patient limited by fatigue Patient left: in bed;with call bell/phone within reach Nurse Communication: Mobility status PT Visit Diagnosis: Unsteadiness on feet (R26.81);Other abnormalities of gait and mobility (R26.89);Muscle weakness (generalized) (M62.81)     Time: 1610-9604 PT Time Calculation (min) (ACUTE ONLY): 20 min  Charges:    $Therapeutic Activity: 8-22 mins PT General Charges $$ ACUTE PT VISIT: 1 Visit                     1:49 PM, 03/29/23 Ocie Bob,  MPT Physical Therapist with [facility] 336 [phone] office [phone] mobile phone

## 2023-03-29 NOTE — Discharge Summary (Addendum)
Physician Discharge Summary   Patient: Dawn Estrada MRN: [id number] DOB: [birth year]  Admit date:     03/16/2023  Discharge date: 03/29/23  Discharge Physician: Onalee Hua Meoshia Billing   PCP: Kirstie Peri, MD   Recommendations at discharge:   Please follow up with primary care provider within 1-2 weeks  Please repeat BMP and CBC in one week   Hospital Course: 89 y.o. female with medical history significant of essential hypertension, hiatal hernia, hemorrhoids who presents to the emergency department due to several months onset of intermittent abdominal pain which worsened last night.  Patient was admitted for small bowel obstruction.  General surgery was consulted and patient right hemicolectomy and SBR on 2/14.  EKG and 2D echocardiogram ordered for preop evaluation were re-assuring..  She had fever and further evaluation reveals COVID infection and she has been started on remdesivir and finished 3 day course. Fevers are improving and she has now had PICC line placement as well as initiation of TPN.  Post op management was directed by general surgery.  Ultimately, patient's diet was gradually advanced and TPN was discontinued on 2/18 dose.  Assessment and Plan: Small bowel obstruction  -CT abdomen and pelvis with contrast showed small bowel obstruction with transition point in the distal ileum where there is an enhancing lesion measuring 1.9 x 0.9 cm. Findings are worrisome for neoplasm. -s/p right hemicolectomy with SBR 03/23/23 EKG and 2D echocardiogram WNL with preserved EF PICC line and TPN placed for nutrition on 2/10 -Post op TPN-- contined until bowel function returns  POD s/p right hemicolectomy with additional ileum - follow postop orders per surgery team appreciate assistance from surgery team NGT removed and having bowel movements  Bowel function returning, started on clears 2/16 and advanced to full liquids on 2/17  -TPN last day 03/27/23 -tolerating diet after stopping TPN;  discussed  with Dr. Farrel Demark pt for d/c 2/20   Heme positive stool -Hgb remains stable last several days prior to d/c -no signs of active blood loss -outpt GI follow up   Fever and cough - personally reviewed CXR--no infiltrates - aspiration precautions - SLP eval as she is HIGH RISK for aspiration >>>dys 1 diet with thin liquids -overall improved   COVID infection - treated  completed remdesivir 2/12  --now stable on RA --airborne precautions per hospital protocols   Hyponatremia mild/hypokalemia Na improved to 135 since holding HCTZ --monitor BMP   Hypophosphatemia -IV replacement ordered by pharmacy while managing TPN  -give additional Kphos today IV   Hyperglycemia from TPN -Increased SSI coverage continue frequent CBG monitoring to every 4 hours -added basal insulin - semglee increased to 14 units daily on 2/17 -we will stop insulin when off TPN -DC semglee after 2/18 dose as 2/18 was last dayTPN.  If TPN is continued for another day tomorrow my team could add a dose of semglee if needed.  -sugars improved when TPN finished   Essential hypertension -IV hydralazine 10 mg every 6 hours as needed for SBP > 170 -IV lopressor transitioned back to home oral metoprolol 50 mg BID on 2/18.  -BP controlled on metoprolol 50 mg bid -d/c hydrochlorothiazide--will not restart   Hiatal hernia GERD Continue Protonix for GI protection  -increase pantoprazole to bid   Hemorrhoids Continue hydrocortisone cream  Elevated LFTs -due to TPN -she is s/p chole with no biliary ductal dilatation -repeat LFTs one week after d/c -tolerating diet         Consultants: general surgery Procedures performed: exp  lap with right hemicolectomy with SBR  Disposition: Home Diet recommendation:  Regular diet DISCHARGE MEDICATION: Allergies as of 03/29/2023       Reactions   Diclofenac Sodium Other (See Comments)   Unknown    Penicillins Swelling        Medication List     STOP  taking these medications    hydrochlorothiazide 12.5 MG tablet Commonly known as: HYDRODIURIL   potassium chloride 10 MEQ tablet Commonly known as: KLOR-CON M       TAKE these medications    acetaminophen 325 MG tablet Commonly known as: TYLENOL Take 325 mg by mouth every 6 (six) hours as needed for mild pain (pain score 1-3).   ALPRAZolam 1 MG tablet Commonly known as: XANAX Take 0.5-1 mg by mouth 2 (two) times daily. 0.5 mg am and 1 mg pm   ascorbic acid 500 MG tablet Commonly known as: VITAMIN C Take 500 mg by mouth daily. One chewable once daily   cholecalciferol 25 MCG (1000 UNIT) tablet Commonly known as: VITAMIN D3 Take 1,000 Units by mouth daily.   fexofenadine 180 MG tablet Commonly known as: ALLEGRA Take 180 mg by mouth as needed for allergies or rhinitis.   metoprolol tartrate 50 MG tablet Commonly known as: LOPRESSOR Take 1 tablet (50 mg total) by mouth 2 (two) times daily. What changed: See the new instructions.   multivitamin tablet Take 1 tablet by mouth daily. Chewable   omeprazole 20 MG capsule Commonly known as: PRILOSEC Take 1 capsule (20 mg total) by mouth daily.   ondansetron 4 MG disintegrating tablet Commonly known as: ZOFRAN-ODT Take 1 tablet (4 mg total) by mouth every 8 (eight) hours as needed for nausea or vomiting.   oxyCODONE 5 MG immediate release tablet Commonly known as: Oxy IR/ROXICODONE Take 1 tablet (5 mg total) by mouth every 4 (four) hours as needed for moderate pain (pain score 4-6).   timolol 0.5 % ophthalmic solution Commonly known as: TIMOPTIC Place 1 drop into both eyes daily.   vitamin B-12 100 MCG tablet Commonly known as: CYANOCOBALAMIN Take 100 mcg by mouth daily.   Vyzulta 0.024 % Soln Generic drug: Latanoprostene Bunod Apply 1 drop to eye at bedtime.               Durable Medical Equipment  (From admission, onward)           Start     Ordered   03/27/23 1348  For home use only D[facility] Hospital  bed  Once       Question Answer Comment  Length of Need Lifetime   The above medical condition requires: Patient requires the ability to reposition frequently   Bed type Semi-electric      03/27/23 1347            Follow-up Information     [facility] Follow up.   Why: PT will call to schedule your first home visit.        [name], MD Follow up on 04/04/2023.   Specialty: General Surgery Why: staple removal Contact information: [id number] [place] [facility] [address] [phone]                Discharge Exam: Filed Weights   03/27/23 0511 03/28/23 0547 03/29/23 0500  Weight: 60.8 kg 62.6 kg 62.3 kg   HEENT:  [place]/AT, No thrush, no icterus CV:  RRR, no rub, no S3, no S4 Lung:  bibasilar crackles.  No wheeze Abd:  soft/+BS, NT Ext:  No edema, no lymphangitis, no synovitis, no rash   Condition at discharge: stable  The results of significant diagnostics from this hospitalization (including imaging, microbiology, ancillary and laboratory) are listed below for reference.   Imaging Studies: DG CHEST PORT 1 VIEW Result Date: 03/26/2023 CLINICAL DATA:  Cough. EXAM: PORTABLE CHEST 1 VIEW COMPARISON:  Chest radiograph dated 03/19/2023. FINDINGS: Right-sided PICC with tip at the cavoatrial junction. Improved aeration of the left lung base. Probable small residual left pleural effusion and associated atelectasis. The right lung is clear. No pneumothorax. Stable cardiac silhouette. Small hiatal hernia. No acute osseous pathology. IMPRESSION: 1. Improved aeration of the left lung base. Small residual left pleural effusion and associated atelectasis. 2. Small hiatal hernia. Electronically Signed   By: Elgie Collard M.D.   On: 03/26/2023 16:40   DG Chest Port 1 View Result Date: 03/23/2023 CLINICAL DATA:  Nasogastric tube placement. EXAM: PORTABLE CHEST 1 VIEW COMPARISON:  None Available. FINDINGS: Image focused on the lower chest and upper abdomen for  enteric tube placement. Tip and side port of the enteric tube below the diaphragm in the region of the mid stomach. Skin staples in the upper abdomen partially included. IMPRESSION: Tip and side port of the enteric tube below the diaphragm in the region of the mid stomach. Electronically Signed   By: Narda Rutherford M.D.   On: 03/23/2023 15:13   CT ENTERO ABD/PELVIS W CONTAST Result Date: 03/21/2023 CLINICAL DATA:  Small bowel mass, preop. EXAM: CT ABDOMEN AND PELVIS WITH CONTRAST (ENTEROGRAPHY) TECHNIQUE: Multidetector CT of the abdomen and pelvis during bolus administration of intravenous contrast. Negative oral contrast was given. RADIATION DOSE REDUCTION: This exam was performed according to the departmental dose-optimization program which includes automated exposure control, adjustment of the mA and/or kV according to patient size and/or use of iterative reconstruction technique. CONTRAST:  OMNIPAQUE IOHEXOL 300 MG/ML  SOLN COMPARISON:  03/16/2023. FINDINGS: Lower chest: Trace left pleural fluid. Scarring in the lingula. Heart is at the upper limits of normal in size to mildly enlarged. No pericardial effusion. Moderate hiatal hernia. Hepatobiliary: Hepatic cysts. No specific follow-up necessary. Cholecystectomy. No unexpected biliary ductal dilatation. Pancreas: Negative. Spleen: Negative. Adrenals/Urinary Tract: Adrenal glands are unremarkable. Bilateral renal cysts. No specific follow-up necessary. Ureters are decompressed. Bladder is grossly unremarkable. Stomach/Bowel: Moderate hiatal hernia. Enhancing lesions seen in the distal small bowel on 03/16/2023 is not as well visualized. There may be a sessile lesion in the distal small bowel measuring 0.8 x 2.1 cm (3/138). Small bowel and colon are otherwise unremarkable. Appendix is not readily visualized. Vascular/Lymphatic: Atherosclerotic calcification of the aorta. No pathologically enlarged lymph nodes. Reproductive: Hysterectomy.  No adnexal  mass. Other: Minimal presacral edema. No free fluid. Mesenteries and peritoneum are unremarkable. Musculoskeletal: Degenerative changes in the spine. IMPRESSION: 1. Possible sessile lesion in the distal small bowel to correspond to the questioned lesion on 03/16/2023. 2. Trace left pleural fluid. 3. Moderate hiatal hernia. 4.  Aortic atherosclerosis (ICD10-I70.0). Electronically Signed   By: Leanna Battles M.D.   On: 03/21/2023 17:33   DG Chest 1 View Result Date: 03/19/2023 CLINICAL DATA:  PICC line placement EXAM: CHEST  1 VIEW COMPARISON:  03/19/2023 FINDINGS: Single frontal view of the chest demonstrates right-sided PICC tip overlying superior vena cava. The cardiac silhouette is stable. Hiatal hernia again noted. Stable small left pleural effusion, with patchy left basilar consolidation. Right chest is clear. No pneumothorax. IMPRESSION: 1. Right-sided PICC line, tip overlying superior vena  cava. 2. Small left pleural effusion, with patchy left basilar consolidation likely atelectasis. 3. Stable hiatal hernia. Electronically Signed   By: Sharlet Salina M.D.   On: 03/19/2023 22:43   DG CHEST PORT 1 VIEW Result Date: 03/19/2023 CLINICAL DATA:  Fever EXAM: PORTABLE CHEST 1 VIEW COMPARISON:  05/18/20 CXR. FINDINGS: Small left pleural effusion. No consolidation or pneumothorax or edema. Degenerative changes of the spine. Normal cardiopericardial shadow is preserved. IMPRESSION: Small left effusion. Electronically Signed   By: Karen Kays M.D.   On: 03/19/2023 14:19   ECHOCARDIOGRAM COMPLETE Result Date: 03/19/2023    ECHOCARDIOGRAM REPORT   Patient Name:   Dawn Estrada Date of Exam: 03/19/2023 Medical Rec #:  [id number]         Height:       66.0 in Accession #:    [id number]        Weight:       136.0 lb Date of Birth:  [birth year]         BSA:          1.697 m Patient Age:    89 years         BP:           131/61 mmHg Patient Gender: F                 HR:           92 bpm. [facility]  Procedure: 2D Echo, Cardiac Doppler and Color Doppler Indications:    Pre-OP clearance  History:        Patient has prior history of Echocardiogram examinations, most                 recent 11/26/2014. Risk Factors:Hypertension. GERD.  Sonographer:    Celesta Gentile RCS Referring Phys: [id number] PRATIK D [facility] IMPRESSIONS  1. Left ventricular intracavitary gradient of 30 mm Hg ia noted at rest likely secondary to hyperdynamic left ventricle. Left ventricular ejection fraction, by estimation, is >75%. The left ventricle has hyperdynamic function. The left ventricle has no regional wall motion abnormalities. There is mild left ventricular hypertrophy. Left ventricular diastolic parameters are consistent with Grade I diastolic dysfunction (impaired relaxation). Elevated left ventricular end-diastolic pressure.  2. Right ventricular systolic function is normal. The right ventricular size is normal. Tricuspid regurgitation signal is inadequate for assessing PA pressure.  3. Left atrial size was moderately dilated.  4. The mitral valve is normal in structure. Trivial mitral valve regurgitation. No evidence of mitral stenosis.  5. The aortic valve is tricuspid. Aortic valve regurgitation is not visualized. No aortic stenosis is present.  6. The inferior vena cava is normal in size with greater than 50% respiratory variability, suggesting right atrial pressure of 3 mmHg.  7. Increased flow velocities may be secondary to anemia, thyrotoxicosis, hyperdynamic or high flow state. Comparison(s): No prior Echocardiogram. FINDINGS  Left Ventricle: Left ventricular ejection fraction, by estimation, is >75%. The left ventricle has hyperdynamic function. The left ventricle has no regional wall motion abnormalities. The left ventricular internal cavity size was normal in size. There is mild left ventricular hypertrophy. Left ventricular diastolic parameters are consistent with Grade I diastolic dysfunction (impaired relaxation). Elevated  left ventricular end-diastolic pressure. Right Ventricle: The right ventricular size is normal. No increase in right ventricular wall thickness. Right ventricular systolic function is normal. Tricuspid regurgitation signal is inadequate for assessing PA pressure. Left Atrium: Left atrial size was moderately dilated. Right Atrium: Right  atrial size was normal in size. Pericardium: There is no evidence of pericardial effusion. Mitral Valve: The mitral valve is normal in structure. Trivial mitral valve regurgitation. No evidence of mitral valve stenosis. Tricuspid Valve: The tricuspid valve is normal in structure. Tricuspid valve regurgitation is not demonstrated. No evidence of tricuspid stenosis. Aortic Valve: The aortic valve is tricuspid. Aortic valve regurgitation is not visualized. No aortic stenosis is present. Pulmonic Valve: The pulmonic valve was normal in structure. Pulmonic valve regurgitation is trivial. No evidence of pulmonic stenosis. Aorta: The aortic root is normal in size and structure. Venous: The inferior vena cava is normal in size with greater than 50% respiratory variability, suggesting right atrial pressure of 3 mmHg. IAS/Shunts: No atrial level shunt detected by color flow Doppler.  LEFT VENTRICLE PLAX 2D LVIDd:         3.40 cm   Diastology LVIDs:         1.80 cm   LV e' medial:    4.79 cm/s LV PW:         1.10 cm   LV E/e' medial:  22.3 LV IVS:        1.20 cm   LV e' lateral:   6.64 cm/s LVOT diam:     1.70 cm   LV E/e' lateral: 16.1 LV SV:         67 LV SV Index:   39 LVOT Area:     2.27 cm  RIGHT VENTRICLE RV S prime:     17.40 cm/s TAPSE (M-mode): 2.3 cm LEFT ATRIUM             Index        RIGHT ATRIUM           Index LA diam:        3.20 cm 1.89 cm/m   RA Area:     14.20 cm LA Vol (A2C):   66.3 ml 39.06 ml/m  RA Volume:   32.50 ml  19.15 ml/m LA Vol (A4C):   72.4 ml 42.65 ml/m LA Biplane Vol: 69.1 ml 40.71 ml/m  AORTIC VALVE LVOT Vmax:   160.00 cm/s LVOT Vmean:  120.000 cm/s LVOT  VTI:    0.295 m  AORTA Ao Root diam: 3.00 cm MITRAL VALVE MV Area (PHT): 2.48 cm     SHUNTS MV Decel Time: 306 msec     Systemic VTI:  0.30 m MV E velocity: 107.00 cm/s  Systemic Diam: 1.70 cm MV A velocity: 143.00 cm/s MV E/A ratio:  0.75 Vishnu Priya Mallipeddi Electronically signed by Winfield Rast Mallipeddi Signature Date/Time: 03/19/2023/1:05:47 PM    Final    [place] EKG SITE RITE Result Date: 03/19/2023 If Site Rite image not attached, placement could not be confirmed due to current cardiac rhythm.  CT ABDOMEN PELVIS W CONTRAST Result Date: 03/16/2023 CLINICAL DATA:  Acute abdominal pain EXAM: CT ABDOMEN AND PELVIS WITH CONTRAST TECHNIQUE: Multidetector CT imaging of the abdomen and pelvis was performed using the standard protocol following bolus administration of intravenous contrast. RADIATION DOSE REDUCTION: This exam was performed according to the departmental dose-optimization program which includes automated exposure control, adjustment of the mA and/or kV according to patient size and/or use of iterative reconstruction technique. CONTRAST:  OMNIPAQUE IOHEXOL 300 MG/ML  SOLN COMPARISON:  CT abdomen and pelvis 01/09/2021 FINDINGS: Lower chest: There is a trace left pleural effusion. Hepatobiliary: There are scattered hepatic cysts measuring up to 2.5 cm similar to the prior study. Gallbladder is not seen.  There is no biliary ductal dilatation. Pancreas: Unremarkable. No pancreatic ductal dilatation or surrounding inflammatory changes. Spleen: Normal in size without focal abnormality. Adrenals/Urinary Tract: Bilateral renal cysts are present. The largest is in the left kidney measuring 3.7 cm. There is no hydronephrosis or perinephric fat stranding. The adrenal glands and bladder are within normal limits. Stomach/Bowel: There some mildly dilated and borderline dilated small bowel loops with air-fluid levels involving the mid small bowel. Transition point is seen in the distal ileum on image 2/59  where there is an enhancing lesion in the small bowel measuring 1.9 x 0.9 cm image 2/59. Terminal ileum is decompressed. Mild mesenteric edema present. Air-fluid levels are seen throughout small bowel. There is a moderate-sized hiatal hernia. The stomach is decompressed. The colon is nondilated. The appendix is not well seen. There is no pneumatosis or free air. Vascular/Lymphatic: Aortic atherosclerosis. No enlarged abdominal or pelvic lymph nodes. Reproductive: Status post hysterectomy. No adnexal masses. Other: No ascites.  There is presacral edema.  There is no free air. Musculoskeletal: No fracture is seen. IMPRESSION: 1. Small-bowel obstruction with transition point in the distal ileum where there is an enhancing lesion measuring 1.9 x 0.9 cm. Findings are worrisome for neoplasm. 2. Mild mesenteric edema. No pneumatosis or free air. 3. Trace left pleural effusion. 4. Hepatic and renal cysts. No follow-up imaging recommended. 5. Moderate-sized hiatal hernia. 6. Aortic atherosclerosis. Aortic Atherosclerosis (ICD10-I70.0). Electronically Signed   By: Darliss Cheney M.D.   On: 03/16/2023 18:37    Microbiology: Results for orders placed or performed during the hospital encounter of 03/16/23  Resp panel by RT-PCR (RSV, Flu A&B, Covid) Anterior Nasal Swab     Status: Abnormal   Collection Time: 03/19/23  9:30 AM   Specimen: Anterior Nasal Swab  Result Value Ref Range Status   SARS Coronavirus 2 by RT PCR POSITIVE (A) NEGATIVE Final    Comment: (NOTE) SARS-CoV-2 target nucleic acids are DETECTED.  The SARS-CoV-2 RNA is generally detectable in upper respiratory specimens during the acute phase of infection. Positive results are indicative of the presence of the identified virus, but do not rule out bacterial infection or co-infection with other pathogens not detected by the test. Clinical correlation with patient history and other diagnostic information is necessary to determine patient infection  status. The expected result is Negative.  Fact Sheet for Patients: BloggerCourse.com  Fact Sheet for Healthcare Providers: SeriousBroker.it  This test is not yet approved or cleared by the [place] FDA and  has been authorized for detection and/or diagnosis of SARS-CoV-2 by FDA under an Emergency Use Authorization (EUA).  This EUA will remain in effect (meaning this test can be used) for the duration of  the COVID-19 declaration under Section 564(b)(1) of the A ct, 21 U.S.C. section 360bbb-3(b)(1), unless the authorization is terminated or revoked sooner.     Influenza A by PCR NEGATIVE NEGATIVE Final   Influenza B by PCR NEGATIVE NEGATIVE Final    Comment: (NOTE) The Xpert Xpress SARS-CoV-2/FLU/RSV plus assay is intended as an aid in the diagnosis of influenza from Nasopharyngeal swab specimens and should not be used as a sole basis for treatment. Nasal washings and aspirates are unacceptable for Xpert Xpress SARS-CoV-2/FLU/RSV testing.  Fact Sheet for Patients: BloggerCourse.com  Fact Sheet for Healthcare Providers: SeriousBroker.it  This test is not yet approved or cleared by the [place] FDA and has been authorized for detection and/or diagnosis of SARS-CoV-2 by FDA under an Emergency Use Authorization (EUA). This  EUA will remain in effect (meaning this test can be used) for the duration of the COVID-19 declaration under Section 564(b)(1) of the Act, 21 U.S.C. section 360bbb-3(b)(1), unless the authorization is terminated or revoked.     Resp Syncytial Virus by PCR NEGATIVE NEGATIVE Final    Comment: (NOTE) Fact Sheet for Patients: BloggerCourse.com  Fact Sheet for Healthcare Providers: SeriousBroker.it  This test is not yet approved or cleared by the [place] FDA and has been authorized for detection  and/or diagnosis of SARS-CoV-2 by FDA under an Emergency Use Authorization (EUA). This EUA will remain in effect (meaning this test can be used) for the duration of the COVID-19 declaration under Section 564(b)(1) of the Act, 21 U.S.C. section 360bbb-3(b)(1), unless the authorization is terminated or revoked.  Performed at [facility], [address] [place], [place] [address]   Culture, blood (Routine X 2) w Reflex to ID Panel     Status: None   Collection Time: 03/19/23  9:47 AM   Specimen: BLOOD  Result Value Ref Range Status   Specimen Description BLOOD BLOOD LEFT FOREARM  Final   Special Requests   Final    BOTTLES DRAWN AEROBIC ONLY Blood Culture adequate volume   Culture   Final    NO GROWTH 5 DAYS Performed at [facility], [address], [place], [place] [address]    Report Status 03/24/2023 FINAL  Final  Culture, blood (Routine X 2) w Reflex to ID Panel     Status: None   Collection Time: 03/19/23  9:52 AM   Specimen: BLOOD  Result Value Ref Range Status   Specimen Description BLOOD BLOOD LEFT HAND  Final   Special Requests   Final    BOTTLES DRAWN AEROBIC ONLY Blood Culture adequate volume   Culture   Final    NO GROWTH 5 DAYS Performed at [facility], [address] [place], [place] [address]    Report Status 03/24/2023 FINAL  Final    Labs: CBC: Recent Labs  Lab 03/24/23 0420 03/25/23 0432 03/26/23 0425 03/27/23 0257 03/28/23 0901 03/29/23 0300  WBC 8.4 9.9 8.7 8.2 9.5 11.6*  NEUTROABS 6.3  --   --  5.6  --   --   HGB 11.3* 10.4* 9.4* 9.4* 9.6* 9.1*  HCT 35.4* 31.1* 29.6* 28.5* 30.8* 28.9*  MCV 91.0 90.4 91.4 92.2 92.8 91.2  PLT 164 152 151 166 159 183   Basic Metabolic Panel: Recent Labs  Lab 03/25/23 0432 03/26/23 0425 03/27/23 0257 03/28/23 0901 03/29/23 0300  NA 134* 135 135 135 136  K 4.7 4.5 4.4 4.6 5.2*  CL 107 109 107 105 103  CO2 21* 20* 24 20* 24  GLUCOSE 253* 236* 180* 179* 84  BUN 20 19 20  24* 22  CREATININE 0.71 0.77 0.72  0.75 0.73  CALCIUM 8.4* 8.4* 8.7* 8.7* 8.5*  MG 2.0 1.8 1.8 1.7 2.1  PHOS 2.3* 2.9 2.2* 2.5 3.2   Liver Function Tests: Recent Labs  Lab 03/26/23 0425 03/29/23 0300  AST 44* 73*  ALT 29 68*  ALKPHOS 36* 48  BILITOT 0.5 0.5  PROT 5.7* 5.8*  ALBUMIN 2.3* 2.2*   CBG: Recent Labs  Lab 03/28/23 2017 03/29/23 0012 03/29/23 0436 03/29/23 0722 03/29/23 1127  GLUCAP 140* 82 87 82 111*    Discharge time spent: greater than 30 minutes.  Signed: Catarina Hartshorn, MD [facility] 03/29/2023

## 2023-03-29 NOTE — Plan of Care (Signed)
  Problem: Clinical Measurements: Goal: Ability to maintain clinical measurements within normal limits will improve Outcome: Progressing Goal: Will remain free from infection Outcome: Progressing   Problem: Safety: Goal: Ability to remain free from injury will improve Outcome: Progressing   

## 2023-03-29 NOTE — TOC Transition Note (Addendum)
Transition of Care [facility]) - Discharge Note   Patient Details  Name: Dawn Estrada MRN: [id number] Date of Birth: [birth year]  Transition of Care [facility]) CM/SW Contact:  Leitha Bleak, RN Phone Number: 03/29/2023, 1:36 PM   Clinical Narrative:   Patient is discharging home with family. Home health with [facility], [facility] Hatchet has been updated. Hospital bed delivered this morning. [facility] SOC will be Monday.     Final next level of care: Home w Home Health Services Barriers to Discharge: Continued Medical Work up   Patient Goals and CMS Choice Patient states their goals for this hospitalization and ongoing recovery are:: to go home CMS Medicare.gov Compare Post Acute Care list provided to:: Patient Represenative (must comment) Choice offered to / list presented to : Adult Children [place] ownership interest in [facility].provided to:: Patient    Discharge Placement                  Name of family member notified: son Patient and family notified of of transfer: 03/29/23  Discharge Plan and Services Additional resources added to the After Visit Summary for                  DME Arranged: Hospital bed DME Agency: [employer] Date DME Agency Contacted: 03/27/23 Time DME Agency Contacted: 1445 Representative spoke with at DME Agency: Ian Malkin            Social Drivers of Health (SDOH) Interventions SDOH Screenings   Food Insecurity: No Food Insecurity (03/17/2023)  Housing: Low Risk  (03/17/2023)  Transportation Needs: No Transportation Needs (03/17/2023)  Utilities: Not At Risk (03/17/2023)  Social Connections: Socially Isolated (03/17/2023)  Tobacco Use: Low Risk  (03/23/2023)    Readmission Risk Interventions     No data to display

## 2023-03-29 NOTE — Progress Notes (Signed)
[facility]  Doing fair. Bms and tolerating diet.   BP 119/66   Pulse 90   Temp 99.2 F (37.3 C) (Oral)   Resp 18   Ht 5\' 6"  (1.676 m)   Wt 62.3 kg   SpO2 99%   BMI 22.17 kg/m  Soft, staples c/d/I no erythema or drainage  Patient s/p R hemicolectomy and SBR for mass in the SB. Mass was pseudocyst.    PRN for pain IS, OOB Regular diet  TPN off  H&H stable, no active bleeding, hemoccult positive but this is likely from staple line irritation, old blood Family getting stuff together for dc home in the next 24 hrs or so  Discussed with team.  Will see next week 2/26   Algis Greenhouse, MD [facility] [address] [place] [place], [place] [address] [phone] (office)

## 2023-04-03 DIAGNOSIS — E871 Hypo-osmolality and hyponatremia: Secondary | ICD-10-CM | POA: Diagnosis not present

## 2023-04-03 DIAGNOSIS — K649 Unspecified hemorrhoids: Secondary | ICD-10-CM | POA: Diagnosis not present

## 2023-04-03 DIAGNOSIS — E876 Hypokalemia: Secondary | ICD-10-CM | POA: Diagnosis not present

## 2023-04-03 DIAGNOSIS — K219 Gastro-esophageal reflux disease without esophagitis: Secondary | ICD-10-CM | POA: Diagnosis not present

## 2023-04-03 DIAGNOSIS — I10 Essential (primary) hypertension: Secondary | ICD-10-CM | POA: Diagnosis not present

## 2023-04-03 DIAGNOSIS — I700 Atherosclerosis of aorta: Secondary | ICD-10-CM | POA: Diagnosis not present

## 2023-04-03 DIAGNOSIS — Z48815 Encounter for surgical aftercare following surgery on the digestive system: Secondary | ICD-10-CM | POA: Diagnosis not present

## 2023-04-03 DIAGNOSIS — U071 COVID-19: Secondary | ICD-10-CM | POA: Diagnosis not present

## 2023-04-03 DIAGNOSIS — R739 Hyperglycemia, unspecified: Secondary | ICD-10-CM | POA: Diagnosis not present

## 2023-04-03 DIAGNOSIS — Z299 Encounter for prophylactic measures, unspecified: Secondary | ICD-10-CM | POA: Diagnosis not present

## 2023-04-03 DIAGNOSIS — L03039 Cellulitis of unspecified toe: Secondary | ICD-10-CM | POA: Diagnosis not present

## 2023-04-04 ENCOUNTER — Ambulatory Visit (INDEPENDENT_AMBULATORY_CARE_PROVIDER_SITE_OTHER): Payer: Medicare Other | Admitting: General Surgery

## 2023-04-04 VITALS — BP 126/80 | HR 88 | Temp 97.6°F | Resp 14 | Ht 66.0 in | Wt 137.0 lb

## 2023-04-04 DIAGNOSIS — K56609 Unspecified intestinal obstruction, unspecified as to partial versus complete obstruction: Secondary | ICD-10-CM

## 2023-04-04 NOTE — Patient Instructions (Signed)
 Try to take metamucil or benefiber daily to see if that helps bulk up the stools. Diet as tolerated. Steristrips will peel off in the next 5-7 days. You can remove them once they are peeling off. It is ok to shower. Pat the area dry.  Monitor the toes and make sure no redness spreading out. This looks like pressure areas on the  toes. Try to make sure shoes are not rubbing.  If there is redness spreading out then start the doxycycline.

## 2023-04-04 NOTE — Progress Notes (Signed)
 [facility]  Here today with [name]. Doing well. Her PCP Dr. Sherryll Burger did a telephone visit. She has some redness on her bilateral toes. She has not been wearing shoes at home but her shoes are a little tight on her toes.  Dr. Sherryll Burger wrote for possible doxycyline but wanted [place] to see the areas since he was not in person to check them. [name] thinks this showed up while in the hospital. I cannot confirm or deny this as I was not looking at her feet.  She is eating. Having pain control. She is having Bms after ever meal. The stools is soft.   BP 126/80   Pulse 88   Temp 97.6 F (36.4 C) (Other (Comment))   Resp 14   Ht 5\' 6"  (1.676 m)   Wt 137 lb (62.1 kg)   SpO2 96%   BMI 22.11 kg/m  Midline with staples, no erythema or drainage, staples removed, steri strips placed Appropriately tender Bilateral toes with what appears to be pressure sores, no redness extending out / shoes do rub toes in this area but the shoes are soft, she is not wearing them at home      Pathology all reassuring SMALL BOWEL MASS, COLON, RIGHT, TERMINAL ILEUM, RESECTION:  Segment of small intestine with focal submucosal dissection and  pseudocyst formation.  Four tubular adenomas.  Negative for high-grade dysplasia or malignancy.    Patient s/p R hemicolectomy, SBR for small bowel pseudocyst causing obstruction. Doing well.   Try to take metamucil or benefiber daily to see if that helps bulk up the stools. Diet as tolerated. Steristrips will peel off in the next 5-7 days. You can remove them once they are peeling off. It is ok to shower. Pat the area dry.  Monitor the toes and make sure no redness spreading out. This looks like pressure areas on the  toes. Try to make sure shoes are not rubbing.  If there is redness spreading out then start the doxycycline.   Will forward Dr. Sherryll Burger my H&P, Operative note, DC summary, Pathology report and today's note.   Future Appointments  Date Time Provider  Department Center  04/26/2023  3:15 PM Raquel James, NP NRE-NRE None  05/03/2023  2:15 PM [name], MD RS-RS None  07/24/2023  3:40 PM Branch, Dorothe Pea, MD [facility]   Algis Greenhouse, MD [facility] [address] [place] [place] [place], [place] [address] [phone] (office)

## 2023-04-05 ENCOUNTER — Telehealth: Payer: Self-pay | Admitting: *Deleted

## 2023-04-05 NOTE — Telephone Encounter (Signed)
 Surgical Date: 03/23/2023 Procedure: SMALL BOWEL RESECTION AND RIGHT HEMI-COLECTOMY  Received call from patient son, [name] [phone] telephone.   Reports that patient has had 5-6 loose stools overnight. States that he has not added fiber to medication at this time.   Patient denied pain. No fever/ chills noted. VS: 98.1 oral, 96% SpO2 on RA, HR 80-90's at rest.   Reports that patient does have anxiety and she was given 1/2 tab Xanax 1 MG this AM "for her nerves" .  Advised to continue to monitor output. Advised to ensure proper fluid intake. Advised to add Benefiber/ Metamucil as directed by provider.   Dr. Henreitta Leber to be made aware.

## 2023-04-05 NOTE — Telephone Encounter (Signed)
 Call placed to patient and patient son Dawn Estrada made aware.  Verbalized understanding.

## 2023-04-06 DIAGNOSIS — I10 Essential (primary) hypertension: Secondary | ICD-10-CM | POA: Diagnosis not present

## 2023-04-06 DIAGNOSIS — E871 Hypo-osmolality and hyponatremia: Secondary | ICD-10-CM | POA: Diagnosis not present

## 2023-04-06 DIAGNOSIS — R739 Hyperglycemia, unspecified: Secondary | ICD-10-CM | POA: Diagnosis not present

## 2023-04-06 DIAGNOSIS — K649 Unspecified hemorrhoids: Secondary | ICD-10-CM | POA: Diagnosis not present

## 2023-04-06 DIAGNOSIS — U071 COVID-19: Secondary | ICD-10-CM | POA: Diagnosis not present

## 2023-04-06 DIAGNOSIS — K219 Gastro-esophageal reflux disease without esophagitis: Secondary | ICD-10-CM | POA: Diagnosis not present

## 2023-04-06 DIAGNOSIS — E876 Hypokalemia: Secondary | ICD-10-CM | POA: Diagnosis not present

## 2023-04-06 DIAGNOSIS — Z48815 Encounter for surgical aftercare following surgery on the digestive system: Secondary | ICD-10-CM | POA: Diagnosis not present

## 2023-04-09 DIAGNOSIS — Z48815 Encounter for surgical aftercare following surgery on the digestive system: Secondary | ICD-10-CM | POA: Diagnosis not present

## 2023-04-09 DIAGNOSIS — I10 Essential (primary) hypertension: Secondary | ICD-10-CM | POA: Diagnosis not present

## 2023-04-09 DIAGNOSIS — R739 Hyperglycemia, unspecified: Secondary | ICD-10-CM | POA: Diagnosis not present

## 2023-04-09 DIAGNOSIS — E876 Hypokalemia: Secondary | ICD-10-CM | POA: Diagnosis not present

## 2023-04-09 DIAGNOSIS — U071 COVID-19: Secondary | ICD-10-CM | POA: Diagnosis not present

## 2023-04-09 DIAGNOSIS — K649 Unspecified hemorrhoids: Secondary | ICD-10-CM | POA: Diagnosis not present

## 2023-04-09 DIAGNOSIS — E871 Hypo-osmolality and hyponatremia: Secondary | ICD-10-CM | POA: Diagnosis not present

## 2023-04-09 DIAGNOSIS — K219 Gastro-esophageal reflux disease without esophagitis: Secondary | ICD-10-CM | POA: Diagnosis not present

## 2023-04-12 DIAGNOSIS — R739 Hyperglycemia, unspecified: Secondary | ICD-10-CM | POA: Diagnosis not present

## 2023-04-12 DIAGNOSIS — K649 Unspecified hemorrhoids: Secondary | ICD-10-CM | POA: Diagnosis not present

## 2023-04-12 DIAGNOSIS — E871 Hypo-osmolality and hyponatremia: Secondary | ICD-10-CM | POA: Diagnosis not present

## 2023-04-12 DIAGNOSIS — E876 Hypokalemia: Secondary | ICD-10-CM | POA: Diagnosis not present

## 2023-04-12 DIAGNOSIS — Z48815 Encounter for surgical aftercare following surgery on the digestive system: Secondary | ICD-10-CM | POA: Diagnosis not present

## 2023-04-12 DIAGNOSIS — I10 Essential (primary) hypertension: Secondary | ICD-10-CM | POA: Diagnosis not present

## 2023-04-12 DIAGNOSIS — U071 COVID-19: Secondary | ICD-10-CM | POA: Diagnosis not present

## 2023-04-12 DIAGNOSIS — K219 Gastro-esophageal reflux disease without esophagitis: Secondary | ICD-10-CM | POA: Diagnosis not present

## 2023-04-13 DIAGNOSIS — Z48815 Encounter for surgical aftercare following surgery on the digestive system: Secondary | ICD-10-CM | POA: Diagnosis not present

## 2023-04-13 DIAGNOSIS — U071 COVID-19: Secondary | ICD-10-CM | POA: Diagnosis not present

## 2023-04-13 DIAGNOSIS — E871 Hypo-osmolality and hyponatremia: Secondary | ICD-10-CM | POA: Diagnosis not present

## 2023-04-13 DIAGNOSIS — I10 Essential (primary) hypertension: Secondary | ICD-10-CM | POA: Diagnosis not present

## 2023-04-16 DIAGNOSIS — E871 Hypo-osmolality and hyponatremia: Secondary | ICD-10-CM | POA: Diagnosis not present

## 2023-04-16 DIAGNOSIS — K219 Gastro-esophageal reflux disease without esophagitis: Secondary | ICD-10-CM | POA: Diagnosis not present

## 2023-04-16 DIAGNOSIS — E876 Hypokalemia: Secondary | ICD-10-CM | POA: Diagnosis not present

## 2023-04-16 DIAGNOSIS — K649 Unspecified hemorrhoids: Secondary | ICD-10-CM | POA: Diagnosis not present

## 2023-04-16 DIAGNOSIS — Z48815 Encounter for surgical aftercare following surgery on the digestive system: Secondary | ICD-10-CM | POA: Diagnosis not present

## 2023-04-16 DIAGNOSIS — U071 COVID-19: Secondary | ICD-10-CM | POA: Diagnosis not present

## 2023-04-16 DIAGNOSIS — R739 Hyperglycemia, unspecified: Secondary | ICD-10-CM | POA: Diagnosis not present

## 2023-04-16 DIAGNOSIS — I10 Essential (primary) hypertension: Secondary | ICD-10-CM | POA: Diagnosis not present

## 2023-04-24 DIAGNOSIS — R739 Hyperglycemia, unspecified: Secondary | ICD-10-CM | POA: Diagnosis not present

## 2023-04-24 DIAGNOSIS — U071 COVID-19: Secondary | ICD-10-CM | POA: Diagnosis not present

## 2023-04-24 DIAGNOSIS — R109 Unspecified abdominal pain: Secondary | ICD-10-CM | POA: Diagnosis not present

## 2023-04-24 DIAGNOSIS — Z48815 Encounter for surgical aftercare following surgery on the digestive system: Secondary | ICD-10-CM | POA: Diagnosis not present

## 2023-04-24 DIAGNOSIS — I10 Essential (primary) hypertension: Secondary | ICD-10-CM | POA: Diagnosis not present

## 2023-04-24 DIAGNOSIS — E871 Hypo-osmolality and hyponatremia: Secondary | ICD-10-CM | POA: Diagnosis not present

## 2023-04-24 DIAGNOSIS — K649 Unspecified hemorrhoids: Secondary | ICD-10-CM | POA: Diagnosis not present

## 2023-04-24 DIAGNOSIS — E876 Hypokalemia: Secondary | ICD-10-CM | POA: Diagnosis not present

## 2023-04-24 DIAGNOSIS — R609 Edema, unspecified: Secondary | ICD-10-CM | POA: Diagnosis not present

## 2023-04-24 DIAGNOSIS — K219 Gastro-esophageal reflux disease without esophagitis: Secondary | ICD-10-CM | POA: Diagnosis not present

## 2023-04-24 DIAGNOSIS — R52 Pain, unspecified: Secondary | ICD-10-CM | POA: Diagnosis not present

## 2023-04-24 DIAGNOSIS — Z299 Encounter for prophylactic measures, unspecified: Secondary | ICD-10-CM | POA: Diagnosis not present

## 2023-04-26 ENCOUNTER — Ambulatory Visit (INDEPENDENT_AMBULATORY_CARE_PROVIDER_SITE_OTHER): Payer: Medicare Other | Admitting: Gastroenterology

## 2023-04-26 ENCOUNTER — Encounter (INDEPENDENT_AMBULATORY_CARE_PROVIDER_SITE_OTHER): Payer: Self-pay | Admitting: Gastroenterology

## 2023-04-26 VITALS — BP 121/68 | Ht 66.0 in | Wt 135.0 lb

## 2023-04-26 DIAGNOSIS — Z48815 Encounter for surgical aftercare following surgery on the digestive system: Secondary | ICD-10-CM | POA: Diagnosis not present

## 2023-04-26 DIAGNOSIS — R739 Hyperglycemia, unspecified: Secondary | ICD-10-CM | POA: Diagnosis not present

## 2023-04-26 DIAGNOSIS — K219 Gastro-esophageal reflux disease without esophagitis: Secondary | ICD-10-CM

## 2023-04-26 DIAGNOSIS — K649 Unspecified hemorrhoids: Secondary | ICD-10-CM

## 2023-04-26 DIAGNOSIS — E876 Hypokalemia: Secondary | ICD-10-CM | POA: Diagnosis not present

## 2023-04-26 DIAGNOSIS — D649 Anemia, unspecified: Secondary | ICD-10-CM

## 2023-04-26 DIAGNOSIS — I10 Essential (primary) hypertension: Secondary | ICD-10-CM | POA: Diagnosis not present

## 2023-04-26 DIAGNOSIS — E871 Hypo-osmolality and hyponatremia: Secondary | ICD-10-CM | POA: Diagnosis not present

## 2023-04-26 DIAGNOSIS — U071 COVID-19: Secondary | ICD-10-CM | POA: Diagnosis not present

## 2023-04-26 MED ORDER — HYDROCORTISONE (PERIANAL) 2.5 % EX CREA: 1.0000 | TOPICAL_CREAM | Freq: Three times a day (TID) | CUTANEOUS | 1 refills | Status: AC

## 2023-04-26 NOTE — Patient Instructions (Signed)
 Please continue with omeprazole 20mg  daily I have sent anusol cream for your hemorrhoids, you can use this 3 times per day x10 days then as needed after that Continue with metamucil or benefiber as you are doing to help thicken up your stools We will recheck blood counts as you had some anemia during your hospital admission  Follow up 3 months   It was a pleasure to see you today. I want to create trusting relationships with patients and provide genuine, compassionate, and quality care. I truly value your feedback! please be on the lookout for a survey regarding your visit with me today. I appreciate your input about our visit and your time in completing this!    Khaidyn Staebell L. Jeanmarie Hubert, MSN, APRN, AGNP-C Adult-Gerontology Nurse Practitioner [facility]

## 2023-04-26 NOTE — Progress Notes (Signed)
 Primary Care Physician:  Kirstie Peri, MD  Primary GI: Dr. Levon Hedger   Patient Location: Home   Provider Location: [facility]   Reason for Visit: follow up of GERD, hemorrhoids    Persons present on the virtual encounter, with roles: Fed Ceci L. Jeanmarie Hubert, MSN, APRN, AGNP-C, Jimmie Molly richardon, patient and son Dawn Estrada    Total time (minutes) spent on medical discussion:10  minutes  Virtual Visit via Telephone  visit is conducted virtually and was requested by patient.   I connected with Dawn Estrada on 04/26/23 at  3:15 PM EDT by telephone and verified that I am speaking with the correct person using two identifiers.   I discussed the limitations, risks, security and privacy concerns of performing an evaluation and management service by telephone and the availability of in person appointments. I also discussed with the patient that there may be a patient responsible charge related to this service. The patient expressed understanding and agreed to proceed.  Chief Complaint  Patient presents with   Follow-up    Patient being seen today for a follow up on Gerd. Reflux is controlled on Omeprazole 20 mg daily. Patient son states patient had surgery a few weeks ago to remove a blockage in her intestines. Patient is sore,but feels this is better.   History of Present Illness: Dawn Estrada is a 89 y.o. female with past medical history of hiatal hernia, hypertension, anxiety and arthritis who presents for GERD follow up   Last seen march 2024, at that time maintaining her weight. Improvement in regurgitation, doing omeprazole 10mg  daily, she decreased dose as she Estrada higher dose gave her a headache. No heartburn or dysphagia.   Recommended to continue omeprazole 10mg  daily  Recent hospital admission in February with findings of bowel obstruction and CT concerning for enhancing lesion, she underwent partial colectomy with mass pathology showing pseudocyst. She had some  worsening anemia during admission after surgery on 2/14, hgb as low as 9.1, on 2/20. no overt bleeding or melena. Stool was heme positive.   Present: Doing well since her surgery in February. Having looser stools off and on, doing some metamucil which seems to help. She has some belly pain. no rectal bleeding or melena. Has appt with Dr. Henreitta Leber again next week. Appetite is good per her son, Dawn Estrada. Has had some episodes of nausea since her surgery. Has not had any reflux symptoms recently, still taking omeprazole 20mg  daily. Denies any issues with swallowing. Has had no labs since she left the hospital. She has had some fatigue but no SOB or dizziness.   She does endorse some issues with hemorrhoids, though she denies any pain or discomfort but she notes she can feel them and they are bothersome to her. She is using some preparation H and witch hazel on them maybe every other day, unsure if this is helping.    Past Medical History:  Diagnosis Date   Anxiety    Arthritis    Hiatal hernia    Hypertension    Reflux      Past Surgical History:  Procedure Laterality Date   ABDOMINAL HYSTERECTOMY     ABDOMINAL SURGERY     resection   APPENDECTOMY     BOWEL RESECTION N/A 03/23/2023   Procedure: SMALL BOWEL RESECTION AND RIGHT HEMI-COLECTOMY;  Surgeon: [name], MD;  Location: [facility];  Service: General;  Laterality: N/A;   HEMORROIDECTOMY     TONSILLECTOMY  Current Meds  Medication Sig   acetaminophen (TYLENOL) 325 MG tablet Take 325 mg by mouth every 6 (six) hours as needed for mild pain (pain score 1-3).   ALPRAZolam (XANAX) 1 MG tablet Take 0.5-1 mg by mouth 2 (two) times daily. 0.5 mg am and 1 mg pm   cholecalciferol (VITAMIN D3) 25 MCG (1000 UNIT) tablet Take 1,000 Units by mouth daily.   fexofenadine (ALLEGRA) 180 MG tablet Take 180 mg by mouth as needed for allergies or rhinitis.   metoprolol tartrate (LOPRESSOR) 50 MG tablet Take 1 tablet (50 mg total) by mouth 2  (two) times daily.   Multiple Vitamin (MULTIVITAMIN) tablet Take 1 tablet by mouth daily. Chewable   omeprazole (PRILOSEC) 20 MG capsule Take 1 capsule (20 mg total) by mouth daily.   ondansetron (ZOFRAN-ODT) 4 MG disintegrating tablet Take 1 tablet (4 mg total) by mouth every 8 (eight) hours as needed for nausea or vomiting.   oxyCODONE (OXY IR/ROXICODONE) 5 MG immediate release tablet Take 1 tablet (5 mg total) by mouth every 4 (four) hours as needed for moderate pain (pain score 4-6).   timolol (TIMOPTIC) 0.5 % ophthalmic solution Place 1 drop into both eyes daily.   vitamin B-12 (CYANOCOBALAMIN) 100 MCG tablet Take 100 mcg by mouth daily.   vitamin C (ASCORBIC ACID) 500 MG tablet Take 500 mg by mouth daily. One chewable once daily   VYZULTA 0.024 % SOLN Apply 1 drop to eye at bedtime.     Family History  Problem Relation Age of Onset   Arthritis Mother    Myasthenia gravis Sister    Cancer Brother    Hypertension Sister    Diabetes Sister     Social History   Socioeconomic History   Marital status: Widowed    Spouse name: Not on file   Number of children: Not on file   Years of education: Not on file   Highest education level: Not on file  Occupational History   Not on file  Tobacco Use   Smoking status: Never    Passive exposure: Never   Smokeless tobacco: Never  Vaping Use   Vaping status: Never Used  Substance and Sexual Activity   Alcohol use: No   Drug use: Never   Sexual activity: Not on file  Other Topics Concern   Not on file  Social History Narrative   Not on file   Social Drivers of Health   Financial Resource Strain: Not on file  Food Insecurity: No Food Insecurity (03/17/2023)   Hunger Vital Sign    Worried About Running Out of Food in the Last Year: Never true    Ran Out of Food in the Last Year: Never true  Transportation Needs: No Transportation Needs (03/17/2023)   PRAPARE - Administrator, Civil Service (Medical): No    Lack of  Transportation (Non-Medical): No  Physical Activity: Not on file  Stress: Not on file  Social Connections: Socially Isolated (03/17/2023)   Social Connection and Isolation Panel [NHANES]    Frequency of Communication with Friends and Family: Three times a week    Frequency of Social Gatherings with Friends and Family: Once a week    Attends Religious Services: Never    Database administrator or Organizations: No    Attends Banker Meetings: Never    Marital Status: Widowed    Review of Systems: Gen: Denies fever, chills, anorexia. Denies fatigue, weakness, weight loss.  CV: Denies  chest pain, palpitations, syncope, peripheral edema, and claudication. Resp: Denies dyspnea at rest, cough, wheezing, coughing up blood, and pleurisy. GI: see HPI Derm: Denies rash, itching, dry skin Psych: Denies depression, anxiety, memory loss, confusion. No homicidal or suicidal ideation.  Heme: Denies bruising, bleeding, and enlarged lymph nodes.  Observations/Objective: No distress. Unable to perform physical exam due to telephone encounter. No video available.   Assessment and Plan: AWA BACHICHA is a 89 y.o. female with past medical history of hiatal hernia, hypertension, anxiety and arthritis who presents for GERD follow up   GERD seems to be well controlled on omeprazole 20mg  daily. She denies breakthrough or dysphagia. Will continue with current PPI regimen and good reflux precautions  She does endorse some issues with hemorrhoids as she has had more looser stools after her recent hospitalization for SBO where she underwent partial colectomy due to findings of a mass (pseudocyst). She denies any rectal bleeding or pain. She is using preparation H and witch hazel which she is unsure is providing much improvement. Will try anusol cream TID x10 days then PRN thereafter.   Hemoglobin in the hospital was mildly low, 9.1 on 2/20, 6 days post surgery. Will repeat CBC again to ensure this  is improving. She has no rectal bleeding or melena.    -continue omeprazole 20mg  daily -repeat CBC -continue metamucil/benefiber -anusol cream TID x10 days for hemorrhoids, PRN thereafter   Follow Up Instructions: 3 months    I discussed the assessment and treatment plan with the patient. The patient was provided an opportunity to ask questions and all were answered. The patient agreed with the plan and demonstrated an understanding of the instructions.   The patient was advised to call back or seek an in-person evaluation if the symptoms worsen or if the condition fails to improve as anticipated.  I provided 10 minutes of NON face-to-face time during this telephone encounter.  Kimia Finan L. Jeanmarie Hubert, MSN, APRN, AGNP-C Adult-Gerontology Nurse Practitioner [facility]  I have reviewed the note and agree with the APP's assessment as described in this progress note  [name], MD Gastroenterology and Hepatology [facility]

## 2023-04-30 DIAGNOSIS — D649 Anemia, unspecified: Secondary | ICD-10-CM | POA: Insufficient documentation

## 2023-05-01 DIAGNOSIS — U071 COVID-19: Secondary | ICD-10-CM | POA: Diagnosis not present

## 2023-05-01 DIAGNOSIS — Z48815 Encounter for surgical aftercare following surgery on the digestive system: Secondary | ICD-10-CM | POA: Diagnosis not present

## 2023-05-01 DIAGNOSIS — I10 Essential (primary) hypertension: Secondary | ICD-10-CM | POA: Diagnosis not present

## 2023-05-01 DIAGNOSIS — K219 Gastro-esophageal reflux disease without esophagitis: Secondary | ICD-10-CM | POA: Diagnosis not present

## 2023-05-01 DIAGNOSIS — R739 Hyperglycemia, unspecified: Secondary | ICD-10-CM | POA: Diagnosis not present

## 2023-05-01 DIAGNOSIS — E876 Hypokalemia: Secondary | ICD-10-CM | POA: Diagnosis not present

## 2023-05-01 DIAGNOSIS — E871 Hypo-osmolality and hyponatremia: Secondary | ICD-10-CM | POA: Diagnosis not present

## 2023-05-01 DIAGNOSIS — K649 Unspecified hemorrhoids: Secondary | ICD-10-CM | POA: Diagnosis not present

## 2023-05-02 ENCOUNTER — Ambulatory Visit (INDEPENDENT_AMBULATORY_CARE_PROVIDER_SITE_OTHER): Admitting: General Surgery

## 2023-05-02 ENCOUNTER — Encounter: Payer: Self-pay | Admitting: General Surgery

## 2023-05-02 VITALS — BP 135/76 | HR 76 | Temp 97.4°F | Resp 14 | Ht 66.0 in | Wt 137.0 lb

## 2023-05-02 DIAGNOSIS — D649 Anemia, unspecified: Secondary | ICD-10-CM | POA: Diagnosis not present

## 2023-05-02 DIAGNOSIS — K56609 Unspecified intestinal obstruction, unspecified as to partial versus complete obstruction: Secondary | ICD-10-CM

## 2023-05-02 NOTE — Patient Instructions (Signed)
 Can get lab work that GI ordered from [facility] beside our office or get Dr. Sherryll Burger to get labwork.  They were checking a CBC due to her anemia she had in the hospital. (Low blood counts)   No heavy lifting > 10 lbs, excessive bending, pushing, pulling, or squatting for 6-8 weeks after surgery.  Diet as tolerated Continue metamucil Call with any issues  Toes look stable, would continue to monitor as you are doing now Unsure what caused the toe skin changes but everything looking stable to better

## 2023-05-02 NOTE — Progress Notes (Signed)
 [facility]  Here with her son. Overall doing ok. Eating but appetite not great. Having Bms 2-3 a day. Soft. Taking metamucil to bulk it up.  Working with therapy.   BP 135/76   Pulse 76   Temp (!) 97.4 F (36.3 C) (Other (Comment))   Resp 14   Ht 5\' 6"  (1.676 m)   Wt 137 lb (62.1 kg)   SpO2 96%   BMI 22.11 kg/m  Soft, nondistended, nontender, midline healed  Great toes bilaterally improved coloration, no signs of worsening ulcer or scabbing   Patient s/p Right hemicolectomy with additional ileum for small bowel mass and SBO. Pseudocyst on pathology. Doing well    Can get lab work that GI ordered from [facility] beside our office or get Dr. Sherryll Burger to get labwork.  They were checking a CBC due to her anemia she had in the hospital. (Low blood counts)   No heavy lifting > 10 lbs, excessive bending, pushing, pulling, or squatting for 6-8 weeks after surgery.  Diet as tolerated Continue metamucil Call with any issues  Toes look stable, would continue to monitor as you are doing now Unsure what caused the toe skin changes but everything looking stable to better   PRN Follow up.  Algis Greenhouse, MD [facility] [address] [place], [place] [address] [phone] (office)

## 2023-05-03 ENCOUNTER — Encounter: Payer: Medicare Other | Admitting: General Surgery

## 2023-05-03 ENCOUNTER — Encounter (INDEPENDENT_AMBULATORY_CARE_PROVIDER_SITE_OTHER): Payer: Self-pay

## 2023-05-03 DIAGNOSIS — I10 Essential (primary) hypertension: Secondary | ICD-10-CM | POA: Diagnosis not present

## 2023-05-03 DIAGNOSIS — N183 Chronic kidney disease, stage 3 unspecified: Secondary | ICD-10-CM | POA: Diagnosis not present

## 2023-05-03 DIAGNOSIS — I700 Atherosclerosis of aorta: Secondary | ICD-10-CM | POA: Diagnosis not present

## 2023-05-03 DIAGNOSIS — Z299 Encounter for prophylactic measures, unspecified: Secondary | ICD-10-CM | POA: Diagnosis not present

## 2023-05-03 DIAGNOSIS — R059 Cough, unspecified: Secondary | ICD-10-CM | POA: Diagnosis not present

## 2023-05-03 LAB — CBC
Hematocrit: 31.7 % — ABNORMAL LOW (ref 34.0–46.6)
Hemoglobin: 10.4 g/dL — ABNORMAL LOW (ref 11.1–15.9)
MCH: 29.1 pg (ref 26.6–33.0)
MCHC: 32.8 g/dL (ref 31.5–35.7)
MCV: 89 fL (ref 79–97)
Platelets: 205 10*3/uL (ref 150–450)
RBC: 3.57 x10E6/uL — ABNORMAL LOW (ref 3.77–5.28)
RDW: 13.4 % (ref 11.7–15.4)
WBC: 7.2 10*3/uL (ref 3.4–10.8)

## 2023-05-10 DIAGNOSIS — K219 Gastro-esophageal reflux disease without esophagitis: Secondary | ICD-10-CM | POA: Diagnosis not present

## 2023-05-10 DIAGNOSIS — K649 Unspecified hemorrhoids: Secondary | ICD-10-CM | POA: Diagnosis not present

## 2023-05-10 DIAGNOSIS — E871 Hypo-osmolality and hyponatremia: Secondary | ICD-10-CM | POA: Diagnosis not present

## 2023-05-10 DIAGNOSIS — R739 Hyperglycemia, unspecified: Secondary | ICD-10-CM | POA: Diagnosis not present

## 2023-05-10 DIAGNOSIS — I10 Essential (primary) hypertension: Secondary | ICD-10-CM | POA: Diagnosis not present

## 2023-05-10 DIAGNOSIS — E876 Hypokalemia: Secondary | ICD-10-CM | POA: Diagnosis not present

## 2023-05-10 DIAGNOSIS — U071 COVID-19: Secondary | ICD-10-CM | POA: Diagnosis not present

## 2023-05-10 DIAGNOSIS — Z48815 Encounter for surgical aftercare following surgery on the digestive system: Secondary | ICD-10-CM | POA: Diagnosis not present

## 2023-05-17 DIAGNOSIS — E876 Hypokalemia: Secondary | ICD-10-CM | POA: Diagnosis not present

## 2023-05-17 DIAGNOSIS — K219 Gastro-esophageal reflux disease without esophagitis: Secondary | ICD-10-CM | POA: Diagnosis not present

## 2023-05-17 DIAGNOSIS — U071 COVID-19: Secondary | ICD-10-CM | POA: Diagnosis not present

## 2023-05-17 DIAGNOSIS — R739 Hyperglycemia, unspecified: Secondary | ICD-10-CM | POA: Diagnosis not present

## 2023-05-17 DIAGNOSIS — E871 Hypo-osmolality and hyponatremia: Secondary | ICD-10-CM | POA: Diagnosis not present

## 2023-05-17 DIAGNOSIS — I10 Essential (primary) hypertension: Secondary | ICD-10-CM | POA: Diagnosis not present

## 2023-05-17 DIAGNOSIS — K649 Unspecified hemorrhoids: Secondary | ICD-10-CM | POA: Diagnosis not present

## 2023-05-17 DIAGNOSIS — Z48815 Encounter for surgical aftercare following surgery on the digestive system: Secondary | ICD-10-CM | POA: Diagnosis not present

## 2023-05-21 DIAGNOSIS — I700 Atherosclerosis of aorta: Secondary | ICD-10-CM | POA: Diagnosis not present

## 2023-05-21 DIAGNOSIS — I471 Supraventricular tachycardia, unspecified: Secondary | ICD-10-CM | POA: Diagnosis not present

## 2023-05-21 DIAGNOSIS — H6123 Impacted cerumen, bilateral: Secondary | ICD-10-CM | POA: Diagnosis not present

## 2023-05-21 DIAGNOSIS — I10 Essential (primary) hypertension: Secondary | ICD-10-CM | POA: Diagnosis not present

## 2023-05-21 DIAGNOSIS — Z299 Encounter for prophylactic measures, unspecified: Secondary | ICD-10-CM | POA: Diagnosis not present

## 2023-05-22 DIAGNOSIS — Z48815 Encounter for surgical aftercare following surgery on the digestive system: Secondary | ICD-10-CM | POA: Diagnosis not present

## 2023-05-22 DIAGNOSIS — K649 Unspecified hemorrhoids: Secondary | ICD-10-CM | POA: Diagnosis not present

## 2023-05-22 DIAGNOSIS — E871 Hypo-osmolality and hyponatremia: Secondary | ICD-10-CM | POA: Diagnosis not present

## 2023-05-22 DIAGNOSIS — R739 Hyperglycemia, unspecified: Secondary | ICD-10-CM | POA: Diagnosis not present

## 2023-05-22 DIAGNOSIS — E876 Hypokalemia: Secondary | ICD-10-CM | POA: Diagnosis not present

## 2023-05-22 DIAGNOSIS — I10 Essential (primary) hypertension: Secondary | ICD-10-CM | POA: Diagnosis not present

## 2023-05-22 DIAGNOSIS — K219 Gastro-esophageal reflux disease without esophagitis: Secondary | ICD-10-CM | POA: Diagnosis not present

## 2023-05-22 DIAGNOSIS — U071 COVID-19: Secondary | ICD-10-CM | POA: Diagnosis not present

## 2023-05-30 DIAGNOSIS — I10 Essential (primary) hypertension: Secondary | ICD-10-CM | POA: Diagnosis not present

## 2023-05-30 DIAGNOSIS — E871 Hypo-osmolality and hyponatremia: Secondary | ICD-10-CM | POA: Diagnosis not present

## 2023-05-30 DIAGNOSIS — K219 Gastro-esophageal reflux disease without esophagitis: Secondary | ICD-10-CM | POA: Diagnosis not present

## 2023-05-30 DIAGNOSIS — Z48815 Encounter for surgical aftercare following surgery on the digestive system: Secondary | ICD-10-CM | POA: Diagnosis not present

## 2023-05-30 DIAGNOSIS — K649 Unspecified hemorrhoids: Secondary | ICD-10-CM | POA: Diagnosis not present

## 2023-05-30 DIAGNOSIS — E876 Hypokalemia: Secondary | ICD-10-CM | POA: Diagnosis not present

## 2023-05-30 DIAGNOSIS — R739 Hyperglycemia, unspecified: Secondary | ICD-10-CM | POA: Diagnosis not present

## 2023-05-30 DIAGNOSIS — U071 COVID-19: Secondary | ICD-10-CM | POA: Diagnosis not present

## 2023-06-05 DIAGNOSIS — R109 Unspecified abdominal pain: Secondary | ICD-10-CM | POA: Diagnosis not present

## 2023-06-06 DIAGNOSIS — I10 Essential (primary) hypertension: Secondary | ICD-10-CM | POA: Diagnosis not present

## 2023-06-08 DIAGNOSIS — R109 Unspecified abdominal pain: Secondary | ICD-10-CM | POA: Diagnosis not present

## 2023-06-11 DIAGNOSIS — R109 Unspecified abdominal pain: Secondary | ICD-10-CM | POA: Diagnosis not present

## 2023-06-11 DIAGNOSIS — R197 Diarrhea, unspecified: Secondary | ICD-10-CM | POA: Diagnosis not present

## 2023-06-21 ENCOUNTER — Encounter (INDEPENDENT_AMBULATORY_CARE_PROVIDER_SITE_OTHER): Payer: Self-pay

## 2023-06-21 DIAGNOSIS — M79675 Pain in left toe(s): Secondary | ICD-10-CM | POA: Diagnosis not present

## 2023-06-21 DIAGNOSIS — Z0000 Encounter for general adult medical examination without abnormal findings: Secondary | ICD-10-CM | POA: Diagnosis not present

## 2023-06-21 DIAGNOSIS — Z79899 Other long term (current) drug therapy: Secondary | ICD-10-CM | POA: Diagnosis not present

## 2023-06-21 DIAGNOSIS — Z299 Encounter for prophylactic measures, unspecified: Secondary | ICD-10-CM | POA: Diagnosis not present

## 2023-06-21 DIAGNOSIS — L84 Corns and callosities: Secondary | ICD-10-CM | POA: Diagnosis not present

## 2023-06-21 DIAGNOSIS — M79674 Pain in right toe(s): Secondary | ICD-10-CM | POA: Diagnosis not present

## 2023-06-21 DIAGNOSIS — E7800 Pure hypercholesterolemia, unspecified: Secondary | ICD-10-CM | POA: Diagnosis not present

## 2023-06-21 DIAGNOSIS — I10 Essential (primary) hypertension: Secondary | ICD-10-CM | POA: Diagnosis not present

## 2023-06-21 DIAGNOSIS — Z7189 Other specified counseling: Secondary | ICD-10-CM | POA: Diagnosis not present

## 2023-06-21 DIAGNOSIS — I70203 Unspecified atherosclerosis of native arteries of extremities, bilateral legs: Secondary | ICD-10-CM | POA: Diagnosis not present

## 2023-06-21 DIAGNOSIS — B351 Tinea unguium: Secondary | ICD-10-CM | POA: Diagnosis not present

## 2023-06-21 DIAGNOSIS — R5383 Other fatigue: Secondary | ICD-10-CM | POA: Diagnosis not present

## 2023-06-21 DIAGNOSIS — E559 Vitamin D deficiency, unspecified: Secondary | ICD-10-CM | POA: Diagnosis not present

## 2023-06-21 DIAGNOSIS — Z1389 Encounter for screening for other disorder: Secondary | ICD-10-CM | POA: Diagnosis not present

## 2023-06-27 DIAGNOSIS — R1031 Right lower quadrant pain: Secondary | ICD-10-CM | POA: Diagnosis not present

## 2023-06-27 DIAGNOSIS — K449 Diaphragmatic hernia without obstruction or gangrene: Secondary | ICD-10-CM | POA: Diagnosis not present

## 2023-06-27 DIAGNOSIS — R109 Unspecified abdominal pain: Secondary | ICD-10-CM | POA: Diagnosis not present

## 2023-07-07 DIAGNOSIS — I10 Essential (primary) hypertension: Secondary | ICD-10-CM | POA: Diagnosis not present

## 2023-07-19 DIAGNOSIS — R300 Dysuria: Secondary | ICD-10-CM | POA: Diagnosis not present

## 2023-07-24 ENCOUNTER — Ambulatory Visit: Payer: Medicare Other | Admitting: Cardiology

## 2023-07-24 DIAGNOSIS — I471 Supraventricular tachycardia, unspecified: Secondary | ICD-10-CM | POA: Diagnosis not present

## 2023-07-24 DIAGNOSIS — I10 Essential (primary) hypertension: Secondary | ICD-10-CM | POA: Diagnosis not present

## 2023-07-24 DIAGNOSIS — I700 Atherosclerosis of aorta: Secondary | ICD-10-CM | POA: Diagnosis not present

## 2023-07-24 DIAGNOSIS — R52 Pain, unspecified: Secondary | ICD-10-CM | POA: Diagnosis not present

## 2023-07-24 DIAGNOSIS — N183 Chronic kidney disease, stage 3 unspecified: Secondary | ICD-10-CM | POA: Diagnosis not present

## 2023-07-27 ENCOUNTER — Ambulatory Visit: Attending: Cardiology | Admitting: Cardiology

## 2023-07-27 ENCOUNTER — Encounter: Payer: Self-pay | Admitting: Cardiology

## 2023-07-27 VITALS — BP 112/58 | HR 81 | Ht 66.0 in | Wt 118.2 lb

## 2023-07-27 DIAGNOSIS — I10 Essential (primary) hypertension: Secondary | ICD-10-CM

## 2023-07-27 DIAGNOSIS — R002 Palpitations: Secondary | ICD-10-CM | POA: Diagnosis not present

## 2023-07-27 NOTE — Progress Notes (Signed)
 Clinical Summary Ms. Dawn Estrada is a 89 y.o.female seen today for follow up of the following medical problems.   Palpitations - Prior event monitoring demonstrated sinus rhythm with PACs and PVCs and PSVT, the longest lasting 8 beats.    - trembling like feeling in chest, can last about 1 hour. No other associated symptoms. Can wake her up at night.     - occasional palpitations, overall tolerable.     2. HTN - she is compliant with meds     3. Chest pain - has been thought to be GI related, improves with belching - has not had recent symptoms     4. LE edema -denies recent issues  5. 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    Past Medical History:  Diagnosis Date   Anxiety    Arthritis    Hiatal hernia    Hypertension    Reflux      Allergies  Allergen Reactions   Diclofenac Sodium Other (See Comments)    Unknown    Penicillins Swelling     Current Outpatient Medications  Medication Sig Dispense Refill   acetaminophen  (TYLENOL ) 325 MG tablet Take 325 mg by mouth every 6 (six) hours as needed for mild pain (pain score 1-3).     ALPRAZolam  (XANAX ) 1 MG tablet Take 0.5-1 mg by mouth 2 (two) times daily. 0.5 mg am and 1 mg pm     cholecalciferol  (VITAMIN D3) 25 MCG (1000 UNIT) tablet Take 1,000 Units by mouth daily.  7   fexofenadine (ALLEGRA) 180 MG tablet Take 180 mg by mouth as needed for allergies or rhinitis.     hydrocortisone  (ANUSOL -HC) 2.5 % rectal cream Place 1 Application rectally 3 (three) times daily. Apply to rectum three times per day x10 days then as needed thereafter 56 g 1   metoprolol  tartrate (LOPRESSOR ) 50 MG tablet Take 1 tablet (50 mg total) by mouth 2 (two) times daily. 60 tablet 2   Multiple Vitamin (MULTIVITAMIN) tablet Take 1 tablet by mouth daily. Chewable     omeprazole  (PRILOSEC) 20 MG capsule Take 1 capsule (20 mg total) by mouth daily. 90 capsule 3   ondansetron  (ZOFRAN -ODT) 4 MG  disintegrating tablet Take 1 tablet (4 mg total) by mouth every 8 (eight) hours as needed for nausea or vomiting. 20 tablet 0   oxyCODONE  (OXY IR/ROXICODONE ) 5 MG immediate release tablet Take 1 tablet (5 mg total) by mouth every 4 (four) hours as needed for moderate pain (pain score 4-6). 15 tablet 0   timolol  (TIMOPTIC ) 0.5 % ophthalmic solution Place 1 drop into both eyes daily.     vitamin B-12 (CYANOCOBALAMIN) 100 MCG tablet Take 100 mcg by mouth daily.     vitamin C (ASCORBIC ACID) 500 MG tablet Take 500 mg by mouth daily. One chewable once daily     VYZULTA  0.024 % SOLN Apply 1 drop to eye at bedtime.     No current facility-administered medications for this visit.     Past Surgical History:  Procedure Laterality Date   ABDOMINAL HYSTERECTOMY     ABDOMINAL SURGERY     resection   APPENDECTOMY     BOWEL RESECTION N/A 03/23/2023   Procedure: SMALL BOWEL RESECTION AND RIGHT HEMI-COLECTOMY;  Surgeon: Awilda Bogus, MD;  Location: [facility];  Service: General;  Laterality: N/A;   HEMORROIDECTOMY     TONSILLECTOMY  Allergies  Allergen Reactions   Diclofenac Sodium Other (See Comments)    Unknown    Penicillins Swelling      Family History  Problem Relation Age of Onset   Arthritis Mother    Myasthenia gravis Sister    Cancer Brother    Hypertension Sister    Diabetes Sister      Social History Ms. Dawn Estrada reports that she has never smoked. She has never been exposed to tobacco smoke. She has never used smokeless tobacco. Ms. Dawn Estrada reports no history of alcohol use.    Physical Examination Today's Vitals   07/27/23 1544  BP: (!) 112/58  Pulse: 81  SpO2: 96%  Weight: 118 lb 3.2 oz (53.6 kg)  Height: 5' 6 (1.676 m)   Body mass index is 19.08 kg/m.  Gen: resting comfortably, no acute distress HEENT: no scleral icterus, pupils equal round and reactive, no palptable cervical adenopathy,  CV: RRR, no mrg, no jvd Resp: Clear to auscultation  bilaterally GI: abdomen is soft, non-tender, non-distended, normal bowel sounds, no hepatosplenomegaly MSK: extremities are warm, no edema.  Skin: warm, no rash Neuro:  no focal deficits Psych: appropriate affect   Diagnostic Studies 03/2023 echo 1. Left ventricular intracavitary gradient of 30 mm Hg ia noted at rest  likely secondary to hyperdynamic left ventricle. Left ventricular ejection  fraction, by estimation, is >75%. The left ventricle has hyperdynamic  function. The left ventricle has no  regional wall motion abnormalities. There is mild left ventricular  hypertrophy. Left ventricular diastolic parameters are consistent with  Grade I diastolic dysfunction (impaired relaxation). Elevated left  ventricular end-diastolic pressure.   2. Right ventricular systolic function is normal. The right ventricular  size is normal. Tricuspid regurgitation signal is inadequate for assessing  PA pressure.   3. Left atrial size was moderately dilated.   4. The mitral valve is normal in structure. Trivial mitral valve  regurgitation. No evidence of mitral stenosis.   5. The aortic valve is tricuspid. Aortic valve regurgitation is not  visualized. No aortic stenosis is present.   6. The inferior vena cava is normal in size with greater than 50%  respiratory variability, suggesting right atrial pressure of 3 mmHg.   7. Increased flow velocities may be secondary to anemia, thyrotoxicosis,  hyperdynamic or high flow state.     Assessment and Plan  Palpitations - mild symptoms, not to the degree she favors titrating lopressor  further - continue current meds   2. HTN -at goal, continue current meds  F/u 6 months    Laurann Pollock, M.D.

## 2023-07-27 NOTE — Patient Instructions (Signed)
 Medication Instructions:  Continue all current medications.   Labwork: none  Testing/Procedures: none  Follow-Up: 6 months   Any Other Special Instructions Will Be Listed Below (If Applicable).   If you need a refill on your cardiac medications before your next appointment, please call your pharmacy.

## 2023-08-03 ENCOUNTER — Encounter (INDEPENDENT_AMBULATORY_CARE_PROVIDER_SITE_OTHER): Payer: Self-pay

## 2023-08-06 DIAGNOSIS — I10 Essential (primary) hypertension: Secondary | ICD-10-CM | POA: Diagnosis not present

## 2023-08-29 DIAGNOSIS — R350 Frequency of micturition: Secondary | ICD-10-CM | POA: Diagnosis not present

## 2023-08-30 DIAGNOSIS — I70203 Unspecified atherosclerosis of native arteries of extremities, bilateral legs: Secondary | ICD-10-CM | POA: Diagnosis not present

## 2023-08-30 DIAGNOSIS — M79675 Pain in left toe(s): Secondary | ICD-10-CM | POA: Diagnosis not present

## 2023-08-30 DIAGNOSIS — B351 Tinea unguium: Secondary | ICD-10-CM | POA: Diagnosis not present

## 2023-08-30 DIAGNOSIS — L84 Corns and callosities: Secondary | ICD-10-CM | POA: Diagnosis not present

## 2023-08-30 DIAGNOSIS — M79674 Pain in right toe(s): Secondary | ICD-10-CM | POA: Diagnosis not present

## 2023-09-04 ENCOUNTER — Telehealth (INDEPENDENT_AMBULATORY_CARE_PROVIDER_SITE_OTHER): Admitting: Gastroenterology

## 2023-09-06 DIAGNOSIS — I10 Essential (primary) hypertension: Secondary | ICD-10-CM | POA: Diagnosis not present

## 2023-09-19 DIAGNOSIS — R109 Unspecified abdominal pain: Secondary | ICD-10-CM | POA: Diagnosis not present

## 2023-09-20 ENCOUNTER — Ambulatory Visit (INDEPENDENT_AMBULATORY_CARE_PROVIDER_SITE_OTHER): Admitting: Gastroenterology

## 2023-09-20 ENCOUNTER — Encounter (INDEPENDENT_AMBULATORY_CARE_PROVIDER_SITE_OTHER): Payer: Self-pay | Admitting: Gastroenterology

## 2023-09-20 VITALS — Wt 126.0 lb

## 2023-09-20 DIAGNOSIS — R1033 Periumbilical pain: Secondary | ICD-10-CM | POA: Insufficient documentation

## 2023-09-20 DIAGNOSIS — K219 Gastro-esophageal reflux disease without esophagitis: Secondary | ICD-10-CM | POA: Diagnosis not present

## 2023-09-20 DIAGNOSIS — R109 Unspecified abdominal pain: Secondary | ICD-10-CM

## 2023-09-20 DIAGNOSIS — K649 Unspecified hemorrhoids: Secondary | ICD-10-CM

## 2023-09-20 DIAGNOSIS — R197 Diarrhea, unspecified: Secondary | ICD-10-CM | POA: Diagnosis not present

## 2023-09-20 MED ORDER — OMEPRAZOLE 20 MG PO CPDR: 20.0000 mg | DELAYED_RELEASE_CAPSULE | Freq: Two times a day (BID) | ORAL | 3 refills | Status: AC

## 2023-09-20 NOTE — Progress Notes (Signed)
 Primary Care Physician:  Maree Isles, MD  Primary GI: Dr. Eartha   Patient Location: Home   Provider Location: [facility]   Reason for Visit: follow up of GERD, hemorrhoids    Persons present on the virtual encounter, with roles: Dawn Signer L. Mariette, MSN, APRN, AGNP-C, Dawn Estrada, patient and son Dawn Estrada    Total time (minutes) spent on medical discussion: 14 minutes  Virtual Visit via telephone visit is conducted virtually and was requested by patient.   I connected with Dawn DEL Bunker on 09/20/23 at  2:15 PM EDT by telephone and verified that I am speaking with the correct person using two identifiers.   I discussed the limitations, risks, security and privacy concerns of performing an evaluation and management service by telephone and the availability of in person appointments. I also discussed with the patient that there may be a patient responsible charge related to this service. The patient expressed understanding and agreed to proceed.  Chief Complaint  Patient presents with   Follow-up    Pt arrives for follow up. Spoke with son Dawn Estrada. Pt having some right upper quadrant pain for 2-3 days. Frequent bowel movements. GERD controlled at this time. Having issues with hemorrhoids but is using preporation H and prescribed cream. Pt has decreased appetite but does eat.    History of Present Illness: Dawn Estrada is a 89 y.o. female with past medical history of hiatal hernia, hypertension, anxiety and arthritis   Patient presenting for: GERD and hemorrhoid follow up Diarrhea  Abdominal pain    Last seen in march, virtually. 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 Having looser stools, doing metamucil. Appetite stable. No reflux symptoms on omeprazole  20mg  daily. Issues with hemorrhoids, using preparation H and witch hazel. No bleeding  Recommended continue omeprazole  20mg  daily, repeat CBC,  continue metamucil, anusol  cream TID x10 days  Cbc 3/26 with hgb improvement to 10.4 from 9.1 after surgery in feb  Present:  Patient's son Dawn Estrada helps provide history. Some issues with hemorrhoids, alternating preparation H and anusol  which she does not feel are helping much. She endorses hemorrhoids come out when she has a BM, no itching, has occasional pain from them, but no bleeding. She endorses more irritation just from feeling the hemorrhoids.   She has about 3-4 BMs per day, stopped doing metamucil as this seemed to be causing more frequent BMs she felt. She endorses more watery stools recently, though feels they have been like this since her hemicolectomy. Had C diff, o&p, stool culture done in may by PCP that was negative. No rectal bleeding or melena.   She endorses some abdominal pain that radiates around to her back, worse with movement. Endorses pain is more centrally located. Improves if she takes dicyclomine. Has rare nausea, takes zofran  PRN. Appetite is pretty good, though son endorses she does not always feel hungry. States she saw her PCP yesterday due to urinary frequency but did not have UTI. No other labs at that time. Endorses some heartburn, taking tums along with her omeprazole  20mg  once daily. No dysphagia or odynophagia.   Past Medical History:  Diagnosis Date   Anxiety    Arthritis    Hiatal hernia    Hypertension    Reflux      Past Surgical History:  Procedure Laterality Date   ABDOMINAL HYSTERECTOMY     ABDOMINAL SURGERY     resection   APPENDECTOMY  BOWEL RESECTION N/A 03/23/2023   Procedure: SMALL BOWEL RESECTION AND RIGHT HEMI-COLECTOMY;  Surgeon: Kallie Manuelita BROCKS, MD;  Location: [facility];  Service: General;  Laterality: N/A;   HEMORROIDECTOMY     TONSILLECTOMY       Current Meds  Medication Sig   acetaminophen  (TYLENOL ) 325 MG tablet Take 325 mg by mouth every 6 (six) hours as needed for mild pain (pain score 1-3).   ALPRAZolam  (XANAX ) 1 MG  tablet Take 0.5-1 mg by mouth 2 (two) times daily. 0.5 mg am and 1 mg pm   cholecalciferol  (VITAMIN D3) 25 MCG (1000 UNIT) tablet Take 1,000 Units by mouth daily.   ciclopirox (PENLAC) 8 % solution Apply topically at bedtime.   dicyclomine (BENTYL) 20 MG tablet Take 20 mg by mouth 3 (three) times daily.   fexofenadine (ALLEGRA) 180 MG tablet Take 180 mg by mouth as needed for allergies or rhinitis.   hydrocortisone  (ANUSOL -HC) 2.5 % rectal cream Place 1 Application rectally 3 (three) times daily. Apply to rectum three times per day x10 days then as needed thereafter   metoprolol  tartrate (LOPRESSOR ) 50 MG tablet Take 1 tablet (50 mg total) by mouth 2 (two) times daily.   Multiple Vitamin (MULTIVITAMIN) tablet Take 1 tablet by mouth daily. Chewable   omeprazole  (PRILOSEC) 20 MG capsule Take 1 capsule (20 mg total) by mouth daily.   ondansetron  (ZOFRAN -ODT) 4 MG disintegrating tablet Take 1 tablet (4 mg total) by mouth every 8 (eight) hours as needed for nausea or vomiting.   oxyCODONE  (OXY IR/ROXICODONE ) 5 MG immediate release tablet Take 1 tablet (5 mg total) by mouth every 4 (four) hours as needed for moderate pain (pain score 4-6).   timolol  (TIMOPTIC ) 0.5 % ophthalmic solution Place 1 drop into both eyes daily.   vitamin B-12 (CYANOCOBALAMIN) 100 MCG tablet Take 100 mcg by mouth daily.   vitamin C (ASCORBIC ACID) 500 MG tablet Take 500 mg by mouth daily. One chewable once daily   VYZULTA  0.024 % SOLN Apply 1 drop to eye at bedtime.     Family History  Problem Relation Age of Onset   Arthritis Mother    Myasthenia gravis Sister    Cancer Brother    Hypertension Sister    Diabetes Sister     Social History   Socioeconomic History   Marital status: Widowed    Spouse name: Not on file   Number of children: Not on file   Years of education: Not on file   Highest education level: Not on file  Occupational History   Not on file  Tobacco Use   Smoking status: Never    Passive  exposure: Never   Smokeless tobacco: Never  Vaping Use   Vaping status: Never Used  Substance and Sexual Activity   Alcohol use: No   Drug use: Never   Sexual activity: Not on file  Other Topics Concern   Not on file  Social History Narrative   Not on file   Social Drivers of Health   Financial Resource Strain: Not on file  Food Insecurity: No Food Insecurity (03/17/2023)   Hunger Vital Sign    Worried About Running Out of Food in the Last Year: Never true    Ran Out of Food in the Last Year: Never true  Transportation Needs: No Transportation Needs (03/17/2023)   PRAPARE - Administrator, Civil Service (Medical): No    Lack of Transportation (Non-Medical): No  Physical Activity: Not on  file  Stress: Not on file  Social Connections: Socially Isolated (03/17/2023)   Social Connection and Isolation Panel    Frequency of Communication with Friends and Family: Three times a week    Frequency of Social Gatherings with Friends and Family: Once a week    Attends Religious Services: Never    Database administrator or Organizations: No    Attends Banker Meetings: Never    Marital Status: Widowed    Review of Systems: Gen: Denies fever, chills, anorexia. Denies fatigue, weakness, weight loss.  CV: Denies chest pain, palpitations, syncope, peripheral edema, and claudication. Resp: Denies dyspnea at rest, cough, wheezing, coughing up blood, and pleurisy. GI: see HPI Derm: Denies rash, itching, dry skin Psych: Denies depression, anxiety, memory loss, confusion. No homicidal or suicidal ideation.  Heme: Denies bruising, bleeding, and enlarged lymph nodes.  Observations/Objective: No distress. Unable to perform physical exam due to telephone encounter. No video available.   Assessment and Plan: Dawn Estrada is a 89 y.o. female with past medical history of hiatal hernia, hypertension, anxiety and arthritis with recent R hemicolectomy who presents virtually  today to follow up on GERD, Hemorrhoids and with abdominal pain and Diarrhea  GERD: previously more controlled on omeprazole  20mg  though feels she is having more frequent heartburn now, needing tums to help. At this time, as she is on low dose PPI, recommend increasing to omeprazole  20mg  BID. Ensure she is not eating late and staying upright 2-3 hours after eating  Diarrhea: previously reported doing better with metamucil to bulk up her stools s/p R hemicolectomy though now reporting they felt this gave her more diarrhea. Had recent stool testing at PCP which was negative. I suspect diarrhea is secondary to her recent hemicolectomy as she states it has been ongoing since this. No recent antibiotics or new meds. Denies rectal bleeding or melena. Recommend trying benefiber 2T daily x1 week then can increase to 2T BID if tolerating thereafter.   Abdominal pain: centrally located though radiates into her back. Pain seems to occur with movement, though also improved with dicyclomine. Assessment is limited due to visit being virtual. No weight loss, no postprandial pain. Query if this is musculoskeletal as it is worse with movement but also could be secondary to underlying IBS as dicyclomine improves pain. She has seen PCP recently and UTI was ruled out. Would recommend in person visit for further evaluation if pain persists or she has any new symptoms. Can continue dicyclomine for now   Hemorrhoids: endorses some discomfort, no bleeding, itching or burning. Using preparation H/anusol  with minimal relief. Mostly bothered by feeling hemorroids protrude. We will try CA hemorrhoid cream to see if this helps to shrink these down and improve her symptoms.   -increase omeprazole  20mg  to BID -start benefiber 2T with a meal daily, increase to 2T with a meal BID if tolerating well after 1 week  -good reflux precautions -[employer] loss adjuster hemorrhoid cream QID x10 day -continue dicyclomine -will need in person  visit for further evaluation if pain persists or new symptoms arise  Follow Up Instructions: 3 months    I discussed the assessment and treatment plan with the patient. The patient was provided an opportunity to ask questions and all were answered. The patient agreed with the plan and demonstrated an understanding of the instructions.   The patient was advised to call back or seek an in-person evaluation if the symptoms worsen or if the condition fails to improve as anticipated.  I provided 14 minutes of NON face-to-face time during this telephone encounter.  Dawn Estrada L. Mariette, MSN, APRN, AGNP-C Adult-Gerontology Nurse Practitioner [facility]  I have reviewed the note and agree with the APP's assessment as described in this progress note  Toribio Fortune, MD Gastroenterology and Hepatology [facility]

## 2023-09-20 NOTE — Patient Instructions (Signed)
-  increase omeprazole  20mg  to twice a day, one in the morning and one in the evening prior to dinner  -start benefiber 2T with a meal daily, increase to 2T with a meal twice daily if tolerating well after 1 week  -continue with good water intake  -avoid eating late, Stay upright 2-3 hours after eating, prior to lying down  -[employer] hemorrhoid cream, can use up to 4 times per day x10 days then as needed after that  Follow up 3 months  It was a pleasure to see you today. I want to create trusting relationships with patients and provide genuine, compassionate, and quality care. I truly value your feedback! please be on the lookout for a survey regarding your visit with me today. I appreciate your input about our visit and your time in completing this!    Lavere Shinsky L. Gusta Marksberry, MSN, APRN, AGNP-C Adult-Gerontology Nurse Practitioner [facility] at [facility]

## 2023-10-06 DIAGNOSIS — I10 Essential (primary) hypertension: Secondary | ICD-10-CM | POA: Diagnosis not present

## 2023-11-05 DIAGNOSIS — N183 Chronic kidney disease, stage 3 unspecified: Secondary | ICD-10-CM | POA: Diagnosis not present

## 2023-11-05 DIAGNOSIS — K529 Noninfective gastroenteritis and colitis, unspecified: Secondary | ICD-10-CM | POA: Diagnosis not present

## 2023-11-05 DIAGNOSIS — Z23 Encounter for immunization: Secondary | ICD-10-CM | POA: Diagnosis not present

## 2023-11-05 DIAGNOSIS — Z299 Encounter for prophylactic measures, unspecified: Secondary | ICD-10-CM | POA: Diagnosis not present

## 2023-11-05 DIAGNOSIS — Z9049 Acquired absence of other specified parts of digestive tract: Secondary | ICD-10-CM | POA: Diagnosis not present

## 2023-11-05 DIAGNOSIS — I10 Essential (primary) hypertension: Secondary | ICD-10-CM | POA: Diagnosis not present

## 2023-11-05 DIAGNOSIS — R1031 Right lower quadrant pain: Secondary | ICD-10-CM | POA: Diagnosis not present

## 2023-11-06 DIAGNOSIS — I10 Essential (primary) hypertension: Secondary | ICD-10-CM | POA: Diagnosis not present

## 2023-11-08 DIAGNOSIS — L03031 Cellulitis of right toe: Secondary | ICD-10-CM | POA: Diagnosis not present

## 2023-11-08 DIAGNOSIS — M79674 Pain in right toe(s): Secondary | ICD-10-CM | POA: Diagnosis not present

## 2023-11-19 DIAGNOSIS — R141 Gas pain: Secondary | ICD-10-CM | POA: Diagnosis not present

## 2023-11-19 DIAGNOSIS — I471 Supraventricular tachycardia, unspecified: Secondary | ICD-10-CM | POA: Diagnosis not present

## 2023-11-19 DIAGNOSIS — I10 Essential (primary) hypertension: Secondary | ICD-10-CM | POA: Diagnosis not present

## 2023-11-19 DIAGNOSIS — Z299 Encounter for prophylactic measures, unspecified: Secondary | ICD-10-CM | POA: Diagnosis not present

## 2023-11-19 DIAGNOSIS — N183 Chronic kidney disease, stage 3 unspecified: Secondary | ICD-10-CM | POA: Diagnosis not present

## 2023-11-22 DIAGNOSIS — M79674 Pain in right toe(s): Secondary | ICD-10-CM | POA: Diagnosis not present

## 2023-11-22 DIAGNOSIS — L03031 Cellulitis of right toe: Secondary | ICD-10-CM | POA: Diagnosis not present

## 2023-12-06 ENCOUNTER — Encounter (INDEPENDENT_AMBULATORY_CARE_PROVIDER_SITE_OTHER): Payer: Self-pay | Admitting: Gastroenterology

## 2023-12-17 ENCOUNTER — Telehealth (INDEPENDENT_AMBULATORY_CARE_PROVIDER_SITE_OTHER): Payer: Self-pay | Admitting: Gastroenterology

## 2023-12-17 NOTE — Telephone Encounter (Signed)
 Fax from [facility] requesting refill on Apothecary Hemorrhoid Cream. Last seen 09/20/23 and has appt with Dr.Castaneda in December. Please advise. Thank you

## 2023-12-17 NOTE — Telephone Encounter (Signed)
 Spoke with Hoy at [facility] and gave her authorization for refill

## 2024-01-09 ENCOUNTER — Ambulatory Visit: Attending: Cardiology | Admitting: Cardiology

## 2024-01-09 ENCOUNTER — Encounter: Payer: Self-pay | Admitting: Cardiology

## 2024-01-09 VITALS — BP 134/62 | HR 76 | Ht 66.0 in | Wt 110.0 lb

## 2024-01-09 DIAGNOSIS — R002 Palpitations: Secondary | ICD-10-CM | POA: Diagnosis not present

## 2024-01-09 DIAGNOSIS — I10 Essential (primary) hypertension: Secondary | ICD-10-CM | POA: Diagnosis not present

## 2024-01-09 NOTE — Progress Notes (Signed)
 Clinical Summary Dawn Estrada is a 89 y.o.female seen today for follow up of the following medical problems.    Palpitations - Prior event monitoring demonstrated sinus rhythm with PACs and PVCs and PSVT, the longest lasting 8 beats.     - denies any recent palpitations.    2. HTN - compliant with meds     3. Chest pain - has been thought to be GI related, improves with belching - has not had recent symptoms     4. LE edema -no recent edema   5. 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 Past Medical History:  Diagnosis Date   Anxiety    Arthritis    Hiatal hernia    Hypertension    Reflux      Allergies  Allergen Reactions   Diclofenac Sodium Other (See Comments)    Unknown    Penicillins Swelling     Current Outpatient Medications  Medication Sig Dispense Refill   acetaminophen  (TYLENOL ) 325 MG tablet Take 325 mg by mouth every 6 (six) hours as needed for mild pain (pain score 1-3).     ALPRAZolam  (XANAX ) 1 MG tablet Take 0.5-1 mg by mouth 2 (two) times daily. 0.5 mg am and 1 mg pm     cholecalciferol  (VITAMIN D3) 25 MCG (1000 UNIT) tablet Take 1,000 Units by mouth daily.  7   ciclopirox (PENLAC) 8 % solution Apply topically at bedtime.     dicyclomine (BENTYL) 20 MG tablet Take 20 mg by mouth 3 (three) times daily.     fexofenadine (ALLEGRA) 180 MG tablet Take 180 mg by mouth as needed for allergies or rhinitis.     hydrocortisone  (ANUSOL -HC) 2.5 % rectal cream Place 1 Application rectally 3 (three) times daily. Apply to rectum three times per day x10 days then as needed thereafter 56 g 1   metoprolol  tartrate (LOPRESSOR ) 50 MG tablet Take 1 tablet (50 mg total) by mouth 2 (two) times daily. 60 tablet 2   Multiple Vitamin (MULTIVITAMIN) tablet Take 1 tablet by mouth daily. Chewable     omeprazole  (PRILOSEC) 20 MG capsule Take 1 capsule (20 mg total) by mouth 2 (two) times daily before a meal. 180 capsule 3    ondansetron  (ZOFRAN -ODT) 4 MG disintegrating tablet Take 1 tablet (4 mg total) by mouth every 8 (eight) hours as needed for nausea or vomiting. 20 tablet 0   oxyCODONE  (OXY IR/ROXICODONE ) 5 MG immediate release tablet Take 1 tablet (5 mg total) by mouth every 4 (four) hours as needed for moderate pain (pain score 4-6). 15 tablet 0   timolol  (TIMOPTIC ) 0.5 % ophthalmic solution Place 1 drop into both eyes daily.     vitamin B-12 (CYANOCOBALAMIN) 100 MCG tablet Take 100 mcg by mouth daily.     vitamin C (ASCORBIC ACID) 500 MG tablet Take 500 mg by mouth daily. One chewable once daily     VYZULTA  0.024 % SOLN Apply 1 drop to eye at bedtime.     No current facility-administered medications for this visit.     Past Surgical History:  Procedure Laterality Date   ABDOMINAL HYSTERECTOMY     ABDOMINAL SURGERY     resection   APPENDECTOMY     BOWEL RESECTION N/A 03/23/2023   Procedure: SMALL BOWEL RESECTION AND RIGHT HEMI-COLECTOMY;  Surgeon: Kallie Manuelita BROCKS, MD;  Location: [facility];  Service: General;  Laterality: N/A;   HEMORROIDECTOMY  TONSILLECTOMY       Allergies  Allergen Reactions   Diclofenac Sodium Other (See Comments)    Unknown    Penicillins Swelling      Family History  Problem Relation Age of Onset   Arthritis Mother    Myasthenia gravis Sister    Cancer Brother    Hypertension Sister    Diabetes Sister      Social History Ms. Braatz reports that she has never smoked. She has never been exposed to tobacco smoke. She has never used smokeless tobacco. Ms. Duling reports no history of alcohol use.   Physical Examination Today's Vitals   01/09/24 1525  BP: 134/62  Pulse: 76  SpO2: 96%  Weight: 110 lb (49.9 kg)  Height: 5' 6 (1.676 m)   Body mass index is 17.75 kg/m.  Gen: resting comfortably, no acute distress HEENT: no scleral icterus, pupils equal round and reactive, no palptable cervical adenopathy,  CV: RRR, no m/rg, no jvd Resp: Clear to  auscultation bilaterally GI: abdomen is soft, non-tender, non-distended, normal bowel sounds, no hepatosplenomegaly MSK: extremities are warm, no edema.  Skin: warm, no rash Neuro:  no focal deficits Psych: appropriate affect   Diagnostic Studies 03/2023 echo 1. Left ventricular intracavitary gradient of 30 mm Hg ia noted at rest  likely secondary to hyperdynamic left ventricle. Left ventricular ejection  fraction, by estimation, is >75%. The left ventricle has hyperdynamic  function. The left ventricle has no  regional wall motion abnormalities. There is mild left ventricular  hypertrophy. Left ventricular diastolic parameters are consistent with  Grade I diastolic dysfunction (impaired relaxation). Elevated left  ventricular end-diastolic pressure.   2. Right ventricular systolic function is normal. The right ventricular  size is normal. Tricuspid regurgitation signal is inadequate for assessing  PA pressure.   3. Left atrial size was moderately dilated.   4. The mitral valve is normal in structure. Trivial mitral valve  regurgitation. No evidence of mitral stenosis.   5. The aortic valve is tricuspid. Aortic valve regurgitation is not  visualized. No aortic stenosis is present.   6. The inferior vena cava is normal in size with greater than 50%  respiratory variability, suggesting right atrial pressure of 3 mmHg.   7. Increased flow velocities may be secondary to anemia, thyrotoxicosis,  hyperdynamic or high flow state.         Assessment and Plan  Palpitations - no recent symptoms, continue metoprolol  at current dose   2. HTN -bp is at goal, continue current therapy.       Dawn Estrada, M.D.

## 2024-01-09 NOTE — Patient Instructions (Signed)
 Medication Instructions:  Continue all current medications.   Labwork: none  Testing/Procedures: none  Follow-Up: 6 months   Any Other Special Instructions Will Be Listed Below (If Applicable).   If you need a refill on your cardiac medications before your next appointment, please call your pharmacy.

## 2024-01-10 ENCOUNTER — Encounter (INDEPENDENT_AMBULATORY_CARE_PROVIDER_SITE_OTHER): Payer: Self-pay | Admitting: Gastroenterology

## 2024-01-10 ENCOUNTER — Ambulatory Visit (INDEPENDENT_AMBULATORY_CARE_PROVIDER_SITE_OTHER): Admitting: Gastroenterology

## 2024-01-10 VITALS — BP 118/76 | HR 75 | Temp 97.8°F | Ht 66.0 in | Wt 110.0 lb

## 2024-01-10 DIAGNOSIS — R112 Nausea with vomiting, unspecified: Secondary | ICD-10-CM | POA: Diagnosis not present

## 2024-01-10 DIAGNOSIS — R197 Diarrhea, unspecified: Secondary | ICD-10-CM

## 2024-01-10 MED ORDER — CHOLESTYRAMINE 4 G PO PACK: 4.0000 g | PACK | Freq: Every day | ORAL | 1 refills | Status: AC

## 2024-01-10 NOTE — Progress Notes (Signed)
 Dawn Estrada, M.D. Gastroenterology & Hepatology [facility] Gastroenterology [address] [place], [place] [address]  Primary Care Physician: Maree Isles, MD [address] [place] [address]  I will communicate my assessment and recommendations to the referring MD via EMR.  Problems: Nausea and vomiting Diarrhea Abdominal pain Small bowel obstruction secondary to small bowel lesion status post resection History of adhesion  History of Present Illness: Dawn Estrada is a 89 y.o. female with PMH GERD, HTN, hiatal hernia, anxiety, who presents for evaluation of nausea, vomiting and diarrhea.  The patient was last seen on 09/20/2023. At that time, the patient was advised to increase omeprazole  to 20 mg twice a day to take Benefiber as needed.  She was also given hemorrhoid cream for hemorrhoids.  Was also advised to take dicyclomine as needed for abdominal pain.  Patient comes to the office with son, who helps providing information. He reports that she has had chronic issues with bowel habits and abdominal pain. States he has felt her symptoms worsened after her last surgery. Notably, the patient have episodes of small bowel obstruction in February 2025.  CT enterography showed a subtle lesion in the distal small bowel.  Due to this she underwent small bowel resection and right colon resection by Dr. Kallie.  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  She states that she is vomiting sometimes once a month, but son states this is more often. Takes Zofran  and peptobismol as needed, but she Zofran  is the one that helps.  She presents frequent flatulence. Son reports that she had issues with diarrhea in the past but this worsened after her last surgery. She is having a BM every 3 hours, usually after having a meal. States that after starting the metamucil, she felt her diarrhea got worse. The patient denies having any  fever,  chills, hematochezia, melena, hematemesis, abdominal distention, abdominal pain, jaundice, pruritus or weight loss.  Patient was taking omeprazole  in the past but it actually make her vomit, so she has stopped this medication.  Past Medical History: Past Medical History:  Diagnosis Date   Anxiety    Arthritis    Hiatal hernia    Hypertension    Reflux     Past Surgical History: Past Surgical History:  Procedure Laterality Date   ABDOMINAL HYSTERECTOMY     ABDOMINAL SURGERY     resection   APPENDECTOMY     BOWEL RESECTION N/A 03/23/2023   Procedure: SMALL BOWEL RESECTION AND RIGHT HEMI-COLECTOMY;  Surgeon: Kallie Manuelita BROCKS, MD;  Location: [facility];  Service: General;  Laterality: N/A;   HEMORROIDECTOMY     TONSILLECTOMY      Family History: Family History  Problem Relation Age of Onset   Arthritis Mother    Myasthenia gravis Sister    Cancer Brother    Hypertension Sister    Diabetes Sister     Social History: Social History   Tobacco Use  Smoking Status Never   Passive exposure: Never  Smokeless Tobacco Never   Social History   Substance and Sexual Activity  Alcohol Use No   Social History   Substance and Sexual Activity  Drug Use Never    Allergies: Allergies  Allergen Reactions   Diclofenac Sodium Other (See Comments)    Unknown    Penicillins Swelling    Medications: Current Outpatient Medications  Medication Sig Dispense Refill   acetaminophen  (TYLENOL ) 325 MG tablet Take 325 mg by  mouth every 6 (six) hours as needed for mild pain (pain score 1-3).     ALPRAZolam  (XANAX ) 1 MG tablet Take 0.5-1 mg by mouth 2 (two) times daily. 0.5 mg am and 1 mg pm     cholecalciferol  (VITAMIN D3) 25 MCG (1000 UNIT) tablet Take 1,000 Units by mouth daily.  7   dicyclomine (BENTYL) 20 MG tablet Take 20 mg by mouth 3 (three) times daily.     fexofenadine (ALLEGRA) 180 MG tablet Take 180 mg by mouth as needed for allergies or rhinitis.     hydrocortisone   (ANUSOL -HC) 2.5 % rectal cream Place 1 Application rectally 3 (three) times daily. Apply to rectum three times per day x10 days then as needed thereafter 56 g 1   metoprolol  tartrate (LOPRESSOR ) 50 MG tablet Take 1 tablet (50 mg total) by mouth 2 (two) times daily. 60 tablet 2   Multiple Vitamin (MULTIVITAMIN) tablet Take 1 tablet by mouth daily. Chewable     nitrofurantoin (MACRODANTIN) 50 MG capsule Take 50 mg by mouth 2 (two) times daily.     ciclopirox (PENLAC) 8 % solution Apply topically at bedtime.     omeprazole  (PRILOSEC) 20 MG capsule Take 1 capsule (20 mg total) by mouth 2 (two) times daily before a meal. 180 capsule 3   ondansetron  (ZOFRAN -ODT) 4 MG disintegrating tablet Take 1 tablet (4 mg total) by mouth every 8 (eight) hours as needed for nausea or vomiting. 20 tablet 0   oxyCODONE  (OXY IR/ROXICODONE ) 5 MG immediate release tablet Take 1 tablet (5 mg total) by mouth every 4 (four) hours as needed for moderate pain (pain score 4-6). 15 tablet 0   timolol  (TIMOPTIC ) 0.5 % ophthalmic solution Place 1 drop into both eyes daily.     vitamin B-12 (CYANOCOBALAMIN) 100 MCG tablet Take 100 mcg by mouth daily.     vitamin C (ASCORBIC ACID) 500 MG tablet Take 500 mg by mouth daily. One chewable once daily     VYZULTA  0.024 % SOLN Apply 1 drop to eye at bedtime.     No current facility-administered medications for this visit.    Review of Systems: GENERAL: negative for malaise, night sweats HEENT: No changes in hearing or vision, no nose bleeds or other nasal problems. NECK: Negative for lumps, goiter, pain and significant neck swelling RESPIRATORY: Negative for cough, wheezing CARDIOVASCULAR: Negative for chest pain, leg swelling, palpitations, orthopnea GI: SEE HPI MUSCULOSKELETAL: Negative for joint pain or swelling, back pain, and muscle pain. SKIN: Negative for lesions, rash PSYCH: Negative for sleep disturbance, mood disorder and recent psychosocial stressors. HEMATOLOGY Negative  for prolonged bleeding, bruising easily, and swollen nodes. ENDOCRINE: Negative for cold or heat intolerance, polyuria, polydipsia and goiter. NEURO: negative for tremor, gait imbalance, syncope and seizures. The remainder of the review of systems is noncontributory.   Physical Exam: BP 118/76 (BP Location: Left Arm, Patient Position: Sitting, Cuff Size: Normal)   Pulse 75   Temp 97.8 F (36.6 C) (Temporal)   Ht 5' 6 (1.676 m)   Wt 110 lb (49.9 kg) Comment: unable to weight, per patient son 110lb at pcp on 01/09/2024.  BMI 17.75 kg/m  GENERAL: The patient is AO x3, in no acute distress.  Sitting wheelchair. HEENT: Head is normocephalic and atraumatic. EOMI are intact. Mouth is well hydrated and without lesions. NECK: Supple. No masses LUNGS: Clear to auscultation. No presence of rhonchi/wheezing/rales. Adequate chest expansion HEART: RRR, normal s1 and s2. ABDOMEN: Soft, nontender, no guarding, no peritoneal  signs, and nondistended. BS +. No masses. EXTREMITIES: Without any cyanosis, clubbing, rash, lesions or edema. NEUROLOGIC: AOx3, no focal motor deficit. SKIN: no jaundice, no rashes  Imaging/Labs: as above  I personally reviewed and interpreted the available labs, imaging and endoscopic files.  Impression and Plan: ERENDIRA CRABTREE is a 89 y.o. female with PMH GERD, HTN, hiatal hernia, anxiety, who presents for evaluation of nausea, vomiting and diarrhea.  Patient has presented multiple gastrointestinal complaints for multiple years, which have been attributed to combination of adhesions and IBS.  The symptoms have exacerbated after she underwent surgical resection of distal small bowel due to recurrent bowel obstruction.  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  She will also benefit from stopping Metamucil as this may be aggravating her diarrhea.   Since she has been able to control up to some degree her vomiting with Zofran , we will continue with this medication as needed. All questions were answered.      -Start cholestyramine  4 g every day -take at noon time -Continue Zofran  4 mg every 8 hours as needed -Stop Metamucil  Dawn Fortune, MD Gastroenterology and Hepatology [facility] Gastroenterology

## 2024-01-10 NOTE — Patient Instructions (Addendum)
 Start cholestyramine 4 g every day -take at noon time Continue Zofran  4 mg every 8 hours as needed Stop Metamucil
